# Patient Record
Sex: Male | Born: 1950
Health system: Southern US, Community
[De-identification: ages and names within clinical notes are randomized; demographics above are authoritative.]

## PROBLEM LIST (undated history)

## (undated) DIAGNOSIS — I10 Essential (primary) hypertension: Secondary | ICD-10-CM

## (undated) DIAGNOSIS — E785 Hyperlipidemia, unspecified: Secondary | ICD-10-CM

## (undated) DIAGNOSIS — E119 Type 2 diabetes mellitus without complications: Secondary | ICD-10-CM

## (undated) DIAGNOSIS — C801 Malignant (primary) neoplasm, unspecified: Secondary | ICD-10-CM

## (undated) DIAGNOSIS — I2699 Other pulmonary embolism without acute cor pulmonale: Secondary | ICD-10-CM

## (undated) DIAGNOSIS — Z5189 Encounter for other specified aftercare: Secondary | ICD-10-CM

## (undated) DIAGNOSIS — H409 Unspecified glaucoma: Secondary | ICD-10-CM

## (undated) DIAGNOSIS — I471 Supraventricular tachycardia, unspecified: Secondary | ICD-10-CM

## (undated) DIAGNOSIS — H269 Unspecified cataract: Secondary | ICD-10-CM

## (undated) DIAGNOSIS — I499 Cardiac arrhythmia, unspecified: Secondary | ICD-10-CM

## (undated) DIAGNOSIS — S29011A Strain of muscle and tendon of front wall of thorax, initial encounter: Secondary | ICD-10-CM

## (undated) HISTORY — DX: Unspecified cataract: H26.9

## (undated) HISTORY — PX: LAPAROSCOPY ABDOMEN DIAGNOSTIC: PRO50

## (undated) HISTORY — PX: EYE SURGERY: SHX253

## (undated) HISTORY — PX: COLONOSCOPY: SHX174

## (undated) HISTORY — DX: Type 2 diabetes mellitus without complications: E11.9

## (undated) HISTORY — PX: TENDON GRAFT: SHX2486

## (undated) HISTORY — DX: Essential (primary) hypertension: I10

## (undated) HISTORY — DX: Hyperlipidemia, unspecified: E78.5

---
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
## 2006-10-23 ENCOUNTER — Ambulatory Visit: Payer: Self-pay | Admitting: Gastroenterology

## 2010-12-20 ENCOUNTER — Ambulatory Visit: Payer: Self-pay | Admitting: Family Medicine

## 2011-01-10 ENCOUNTER — Ambulatory Visit: Payer: Self-pay | Admitting: Family Medicine

## 2011-02-10 ENCOUNTER — Ambulatory Visit: Payer: Self-pay | Admitting: Family Medicine

## 2011-10-31 ENCOUNTER — Ambulatory Visit: Payer: Self-pay | Admitting: Family Medicine

## 2013-06-11 DIAGNOSIS — H409 Unspecified glaucoma: Secondary | ICD-10-CM

## 2013-06-11 HISTORY — DX: Unspecified glaucoma: H40.9

## 2014-02-11 ENCOUNTER — Ambulatory Visit: Payer: Self-pay | Admitting: Family Medicine

## 2014-02-17 DIAGNOSIS — E119 Type 2 diabetes mellitus without complications: Secondary | ICD-10-CM

## 2014-02-17 DIAGNOSIS — I10 Essential (primary) hypertension: Secondary | ICD-10-CM | POA: Insufficient documentation

## 2014-02-17 DIAGNOSIS — E1169 Type 2 diabetes mellitus with other specified complication: Secondary | ICD-10-CM | POA: Insufficient documentation

## 2014-02-17 DIAGNOSIS — E785 Hyperlipidemia, unspecified: Secondary | ICD-10-CM | POA: Insufficient documentation

## 2014-02-17 DIAGNOSIS — I152 Hypertension secondary to endocrine disorders: Secondary | ICD-10-CM | POA: Insufficient documentation

## 2014-12-07 DIAGNOSIS — I10 Essential (primary) hypertension: Secondary | ICD-10-CM | POA: Insufficient documentation

## 2014-12-08 ENCOUNTER — Ambulatory Visit (INDEPENDENT_AMBULATORY_CARE_PROVIDER_SITE_OTHER): Payer: Medicare Other | Admitting: Family Medicine

## 2014-12-08 ENCOUNTER — Encounter: Payer: Self-pay | Admitting: Family Medicine

## 2014-12-08 VITALS — BP 135/78 | HR 88 | Temp 98.7°F | Ht 70.0 in | Wt 230.0 lb

## 2014-12-08 DIAGNOSIS — E119 Type 2 diabetes mellitus without complications: Secondary | ICD-10-CM

## 2014-12-08 DIAGNOSIS — E785 Hyperlipidemia, unspecified: Secondary | ICD-10-CM

## 2014-12-08 DIAGNOSIS — I10 Essential (primary) hypertension: Secondary | ICD-10-CM | POA: Diagnosis not present

## 2014-12-08 LAB — BAYER DCA HB A1C WAIVED: HB A1C (BAYER DCA - WAIVED): 6.2 % (ref <7.0)

## 2014-12-08 NOTE — Assessment & Plan Note (Signed)
The current medical regimen is effective;  continue present plan and medications.  

## 2014-12-08 NOTE — Progress Notes (Signed)
   BP 135/78 mmHg  Pulse 88  Temp(Src) 98.7 F (37.1 C)  Ht 5\' 10"  (1.778 m)  Wt 230 lb (104.327 kg)  BMI 33.00 kg/m2  SpO2 98%   Subjective:    Patient ID: Randy Harper, male    DOB: 11-15-1950, 64 y.o.   MRN: [id number]  HPI: Randy Harper is a 64 y.o. male  Chief Complaint  Patient presents with  . Diabetes  BP and lipids all doing well Takes meds every day No low glu spells No side effects Doing well long term   Relevant past medical, surgical, family and social history reviewed and updated as indicated. Interim medical history since our last visit reviewed. Allergies and medications reviewed and updated.  Review of Systems  Constitutional: Negative.   Respiratory: Negative.   Cardiovascular: Negative.     Per HPI unless specifically indicated above     Objective:    BP 135/78 mmHg  Pulse 88  Temp(Src) 98.7 F (37.1 C)  Ht 5\' 10"  (1.778 m)  Wt 230 lb (104.327 kg)  BMI 33.00 kg/m2  SpO2 98%  Wt Readings from Last 3 Encounters:  12/08/14 230 lb (104.327 kg)  08/23/14 231 lb (104.781 kg)    Physical Exam  Constitutional: He is oriented to person, place, and time. He appears well-developed and well-nourished. No distress.  HENT:  Head: Normocephalic and atraumatic.  Right Ear: Hearing normal.  Left Ear: Hearing normal.  Nose: Nose normal.  Eyes: Conjunctivae and lids are normal. Right eye exhibits no discharge. Left eye exhibits no discharge. No scleral icterus.  Cardiovascular: Normal rate, regular rhythm and normal heart sounds.   Pulmonary/Chest: Effort normal and breath sounds normal. No respiratory distress.  Musculoskeletal: Normal range of motion.  Neurological: He is alert and oriented to person, place, and time.  Skin: Skin is intact. No rash noted.  Psychiatric: He has a normal mood and affect. His speech is normal and behavior is normal. Judgment and thought content normal. Cognition and memory are normal.    No results found for this  or any previous visit.    Assessment & Plan:   Problem List Items Addressed This Visit      Cardiovascular and Mediastinum   Hypertension    The current medical regimen is effective;  continue present plan and medications.         Endocrine   Diabetes mellitus without complication - Primary    The current medical regimen is effective;  continue present plan and medications.       Relevant Orders   Bayer DCA Hb A1c Waived     Other   Hyperlipidemia    The current medical regimen is effective;  continue present plan and medications.           Follow up plan: Return in about 3 months (around 03/10/2015) for Physical Exam and a1c.

## 2014-12-28 ENCOUNTER — Other Ambulatory Visit: Payer: Self-pay | Admitting: Family Medicine

## 2014-12-30 ENCOUNTER — Other Ambulatory Visit: Payer: Self-pay | Admitting: Family Medicine

## 2015-02-19 ENCOUNTER — Other Ambulatory Visit: Payer: Self-pay | Admitting: Family Medicine

## 2015-03-15 ENCOUNTER — Encounter: Payer: Self-pay | Admitting: Family Medicine

## 2015-03-15 ENCOUNTER — Ambulatory Visit (INDEPENDENT_AMBULATORY_CARE_PROVIDER_SITE_OTHER): Payer: Medicare Other | Admitting: Family Medicine

## 2015-03-15 VITALS — BP 115/76 | HR 94 | Temp 97.7°F | Ht 68.6 in | Wt 225.0 lb

## 2015-03-15 DIAGNOSIS — Z23 Encounter for immunization: Secondary | ICD-10-CM

## 2015-03-15 DIAGNOSIS — N400 Enlarged prostate without lower urinary tract symptoms: Secondary | ICD-10-CM | POA: Diagnosis not present

## 2015-03-15 DIAGNOSIS — I10 Essential (primary) hypertension: Secondary | ICD-10-CM

## 2015-03-15 DIAGNOSIS — Z0000 Encounter for general adult medical examination without abnormal findings: Secondary | ICD-10-CM

## 2015-03-15 DIAGNOSIS — E785 Hyperlipidemia, unspecified: Secondary | ICD-10-CM | POA: Diagnosis not present

## 2015-03-15 DIAGNOSIS — E119 Type 2 diabetes mellitus without complications: Secondary | ICD-10-CM

## 2015-03-15 LAB — URINALYSIS, ROUTINE W REFLEX MICROSCOPIC
BILIRUBIN UA: NEGATIVE
GLUCOSE, UA: NEGATIVE
KETONES UA: NEGATIVE
Leukocytes, UA: NEGATIVE
Nitrite, UA: NEGATIVE
RBC UA: NEGATIVE
SPEC GRAV UA: 1.02 (ref 1.005–1.030)
UUROB: 2 mg/dL — AB (ref 0.2–1.0)
pH, UA: 7 (ref 5.0–7.5)

## 2015-03-15 LAB — BAYER DCA HB A1C WAIVED: HB A1C (BAYER DCA - WAIVED): 6.1 % (ref <7.0)

## 2015-03-15 MED ORDER — MELOXICAM 15 MG PO TABS: 15.0000 mg | ORAL_TABLET | Freq: Every day | ORAL | Status: DC

## 2015-03-15 MED ORDER — SIMVASTATIN 40 MG PO TABS: 40.0000 mg | ORAL_TABLET | Freq: Every day | ORAL | Status: DC

## 2015-03-15 MED ORDER — BENAZEPRIL HCL 10 MG PO TABS: 10.0000 mg | ORAL_TABLET | Freq: Every day | ORAL | Status: DC

## 2015-03-15 MED ORDER — EZETIMIBE 10 MG PO TABS: 10.0000 mg | ORAL_TABLET | Freq: Every day | ORAL | Status: DC

## 2015-03-15 MED ORDER — METFORMIN HCL 500 MG PO TABS: 500.0000 mg | ORAL_TABLET | Freq: Two times a day (BID) | ORAL | Status: DC

## 2015-03-15 NOTE — Assessment & Plan Note (Signed)
The current medical regimen is effective;  continue present plan and medications.  

## 2015-03-15 NOTE — Progress Notes (Signed)
BP 115/76 mmHg  Pulse 94  Temp(Src) 97.7 F (36.5 C)  Ht 5' 8.6" (1.742 m)  Wt 225 lb (102.059 kg)  BMI 33.63 kg/m2  SpO2 98%   Subjective:    Patient ID: Randy Harper, male    DOB: Aug 16, 1950, 64 y.o.   MRN: [id number]  HPI: Randy Harper is a 64 y.o. male  Chief Complaint  Patient presents with  . Annual Exam  AWV metric done  Patient also follow-up hypertension blood pressure doing well no complaints from benazepril.  Follow-up diabetes no low blood sugar spells no issues with metformin no GI upset When checks blood sugars has been doing well.  Cholesterol doing well no complaints from medications. All in all taking medications without side effects and taking faithfully.   Relevant past medical, surgical, family and social history reviewed and updated as indicated. Interim medical history since our last visit reviewed. Allergies and medications reviewed and updated.  Review of Systems  Constitutional: Negative.   HENT: Negative.   Eyes: Negative.   Respiratory: Negative.   Cardiovascular: Negative.   Gastrointestinal: Negative.   Endocrine: Negative.   Genitourinary: Negative.   Musculoskeletal: Negative.   Skin: Negative.   Allergic/Immunologic: Negative.   Neurological: Negative.   Hematological: Negative.   Psychiatric/Behavioral: Negative.     Per HPI unless specifically indicated above     Objective:    BP 115/76 mmHg  Pulse 94  Temp(Src) 97.7 F (36.5 C)  Ht 5' 8.6" (1.742 m)  Wt 225 lb (102.059 kg)  BMI 33.63 kg/m2  SpO2 98%  Wt Readings from Last 3 Encounters:  03/15/15 225 lb (102.059 kg)  12/08/14 230 lb (104.327 kg)  08/23/14 231 lb (104.781 kg)    Physical Exam  Constitutional: He is oriented to person, place, and time. He appears well-developed and well-nourished.  HENT:  Head: Normocephalic and atraumatic.  Right Ear: External ear normal.  Left Ear: External ear normal.  Eyes: Conjunctivae and EOM are normal. Pupils are  equal, round, and reactive to light.  Neck: Normal range of motion. Neck supple.  Cardiovascular: Normal rate, regular rhythm, normal heart sounds and intact distal pulses.   Pulmonary/Chest: Effort normal and breath sounds normal.  Abdominal: Soft. Bowel sounds are normal. There is no splenomegaly or hepatomegaly.  Genitourinary: Rectum normal, prostate normal and penis normal.  Mild BPH  Musculoskeletal: Normal range of motion.  Patient had a big weekend with grandchildren now left hip bursitis is flared started meloxicam yesterday  Neurological: He is alert and oriented to person, place, and time. He has normal reflexes.  Skin: No rash noted. No erythema.  Psychiatric: He has a normal mood and affect. His behavior is normal. Judgment and thought content normal.    Results for orders placed or performed in visit on 12/08/14  Bayer DCA Hb A1c Waived  Result Value Ref Range   Bayer DCA Hb A1c Waived 6.2 <7.0 %      Assessment & Plan:   Problem List Items Addressed This Visit      Cardiovascular and Mediastinum   Hypertension    The current medical regimen is effective;  continue present plan and medications.       Relevant Medications   ezetimibe (ZETIA) 10 MG tablet   simvastatin (ZOCOR) 40 MG tablet   benazepril (LOTENSIN) 10 MG tablet   Other Relevant Orders   Comprehensive metabolic panel   CBC with Differential/Platelet   TSH   Urinalysis, Routine w reflex  microscopic (not at [facility])     Endocrine   Diabetes mellitus without complication ([place])    The current medical regimen is effective;  continue present plan and medications.       Relevant Medications   simvastatin (ZOCOR) 40 MG tablet   metFORMIN (GLUCOPHAGE) 500 MG tablet   benazepril (LOTENSIN) 10 MG tablet   Other Relevant Orders   Comprehensive metabolic panel   CBC with Differential/Platelet   Bayer DCA Hb A1c Waived   TSH   Urinalysis, Routine w reflex microscopic (not at [facility])     Genitourinary    BPH (benign prostatic hyperplasia)   Relevant Orders   TSH   PSA     Other   Hyperlipidemia    The current medical regimen is effective;  continue present plan and medications.       Relevant Medications   ezetimibe (ZETIA) 10 MG tablet   simvastatin (ZOCOR) 40 MG tablet   benazepril (LOTENSIN) 10 MG tablet   Other Relevant Orders   Comprehensive metabolic panel   Lipid panel   CBC with Differential/Platelet   TSH   Urinalysis, Routine w reflex microscopic (not at [facility])    Other Visit Diagnoses    PE (physical exam), annual    -  Primary    Immunization due        Relevant Orders    Flu Vaccine QUAD 36+ mos PF IM (Fluarix & Fluzone Quad PF) (Completed)        Follow up plan: Return in about 3 months (around 06/15/2015), or if symptoms worsen or fail to improve, for DM and a1c.

## 2015-03-16 ENCOUNTER — Encounter: Payer: Self-pay | Admitting: Family Medicine

## 2015-03-16 LAB — CBC WITH DIFFERENTIAL/PLATELET
BASOS ABS: 0 10*3/uL (ref 0.0–0.2)
BASOS: 0 %
EOS (ABSOLUTE): 0.2 10*3/uL (ref 0.0–0.4)
Eos: 2 %
HEMOGLOBIN: 15 g/dL (ref 12.6–17.7)
Hematocrit: 42 % (ref 37.5–51.0)
IMMATURE GRANS (ABS): 0 10*3/uL (ref 0.0–0.1)
IMMATURE GRANULOCYTES: 0 %
LYMPHS: 17 %
Lymphocytes Absolute: 1.2 10*3/uL (ref 0.7–3.1)
MCH: 31.4 pg (ref 26.6–33.0)
MCHC: 35.7 g/dL (ref 31.5–35.7)
MCV: 88 fL (ref 79–97)
MONOCYTES: 9 %
Monocytes Absolute: 0.7 10*3/uL (ref 0.1–0.9)
NEUTROS ABS: 5.1 10*3/uL (ref 1.4–7.0)
Neutrophils: 72 %
Platelets: 229 10*3/uL (ref 150–379)
RBC: 4.78 x10E6/uL (ref 4.14–5.80)
RDW: 12.8 % (ref 12.3–15.4)
WBC: 7.1 10*3/uL (ref 3.4–10.8)

## 2015-03-16 LAB — COMPREHENSIVE METABOLIC PANEL
A/G RATIO: 1.7 (ref 1.1–2.5)
ALK PHOS: 77 IU/L (ref 39–117)
ALT: 18 IU/L (ref 0–44)
AST: 16 IU/L (ref 0–40)
Albumin: 4.3 g/dL (ref 3.6–4.8)
BUN/Creatinine Ratio: 16 (ref 10–22)
BUN: 14 mg/dL (ref 8–27)
Bilirubin Total: 0.4 mg/dL (ref 0.0–1.2)
CO2: 24 mmol/L (ref 18–29)
Calcium: 9.4 mg/dL (ref 8.6–10.2)
Chloride: 99 mmol/L (ref 97–108)
Creatinine, Ser: 0.88 mg/dL (ref 0.76–1.27)
GFR calc Af Amer: 103 mL/min/{1.73_m2} (ref >59)
GFR calc non Af Amer: 90 mL/min/{1.73_m2} (ref >59)
GLOBULIN, TOTAL: 2.5 g/dL (ref 1.5–4.5)
Glucose: 95 mg/dL (ref 65–99)
POTASSIUM: 4.5 mmol/L (ref 3.5–5.2)
SODIUM: 138 mmol/L (ref 134–144)
Total Protein: 6.8 g/dL (ref 6.0–8.5)

## 2015-03-16 LAB — LIPID PANEL
CHOL/HDL RATIO: 3.2 ratio (ref 0.0–5.0)
CHOLESTEROL TOTAL: 127 mg/dL (ref 100–199)
HDL: 40 mg/dL (ref >39)
LDL Calculated: 64 mg/dL (ref 0–99)
TRIGLYCERIDES: 114 mg/dL (ref 0–149)
VLDL Cholesterol Cal: 23 mg/dL (ref 5–40)

## 2015-03-16 LAB — TSH: TSH: 1.16 u[IU]/mL (ref 0.450–4.500)

## 2015-03-16 LAB — PSA: Prostate Specific Ag, Serum: 0.9 ng/mL (ref 0.0–4.0)

## 2015-05-07 ENCOUNTER — Other Ambulatory Visit: Payer: Self-pay | Admitting: Family Medicine

## 2015-05-21 ENCOUNTER — Other Ambulatory Visit: Payer: Self-pay | Admitting: Family Medicine

## 2015-05-23 NOTE — Telephone Encounter (Signed)
Your patient 

## 2015-06-15 ENCOUNTER — Ambulatory Visit (INDEPENDENT_AMBULATORY_CARE_PROVIDER_SITE_OTHER): Payer: Medicare Other | Admitting: Family Medicine

## 2015-06-15 ENCOUNTER — Encounter: Payer: Self-pay | Admitting: Family Medicine

## 2015-06-15 VITALS — BP 133/83 | HR 87 | Temp 97.9°F | Ht 69.0 in | Wt 232.0 lb

## 2015-06-15 DIAGNOSIS — I10 Essential (primary) hypertension: Secondary | ICD-10-CM

## 2015-06-15 DIAGNOSIS — E119 Type 2 diabetes mellitus without complications: Secondary | ICD-10-CM | POA: Diagnosis not present

## 2015-06-15 DIAGNOSIS — Z113 Encounter for screening for infections with a predominantly sexual mode of transmission: Secondary | ICD-10-CM | POA: Diagnosis not present

## 2015-06-15 DIAGNOSIS — S29011A Strain of muscle and tendon of front wall of thorax, initial encounter: Secondary | ICD-10-CM | POA: Diagnosis not present

## 2015-06-15 LAB — BAYER DCA HB A1C WAIVED: HB A1C (BAYER DCA - WAIVED): 6.3 % (ref <7.0)

## 2015-06-15 NOTE — Progress Notes (Signed)
BP 133/83 mmHg  Pulse 87  Temp(Src) 97.9 F (36.6 C)  Ht 5\' 9"  (1.753 m)  Wt 232 lb (105.235 kg)  BMI 34.24 kg/m2  SpO2 94%   Subjective:    Patient ID: Randy Harper, male    DOB: 02-07-1951, 65 y.o.   MRN: [id number]  HPI: Randy Harper is a 65 y.o. male  Chief Complaint  Patient presents with  . Diabetes  . sensitivity to pressure right side of chest   Patient doing well with medications taken faithfully with no side effects blood pressure doing well, cholesterol doing well, and diabetes with no low blood sugar spells or issues. Patient does care for his infant grandchildren has some right nipple area tenderness been ongoing about 5 weeks no known specific trauma does carry in the right cryptic of his arm. No masses no lumps no rash. Relevant past medical, surgical, family and social history reviewed and updated as indicated. Interim medical history since our last visit reviewed. Allergies and medications reviewed and updated.  Review of Systems  Constitutional: Negative.   Respiratory: Negative.   Cardiovascular: Negative.     Per HPI unless specifically indicated above     Objective:    BP 133/83 mmHg  Pulse 87  Temp(Src) 97.9 F (36.6 C)  Ht 5\' 9"  (1.753 m)  Wt 232 lb (105.235 kg)  BMI 34.24 kg/m2  SpO2 94%  Wt Readings from Last 3 Encounters:  06/15/15 232 lb (105.235 kg)  03/15/15 225 lb (102.059 kg)  12/08/14 230 lb (104.327 kg)    Physical Exam  Constitutional: He is oriented to person, place, and time. He appears well-developed and well-nourished. No distress.  HENT:  Head: Normocephalic and atraumatic.  Right Ear: Hearing normal.  Left Ear: Hearing normal.  Nose: Nose normal.  Eyes: Conjunctivae and lids are normal. Right eye exhibits no discharge. Left eye exhibits no discharge. No scleral icterus.  Cardiovascular: Normal rate, regular rhythm and normal heart sounds.   Pulmonary/Chest: Effort normal and breath sounds normal. No respiratory  distress.  Musculoskeletal: Normal range of motion.  Neurological: He is alert and oriented to person, place, and time.  Skin: Skin is intact. No rash noted.  Right nipple area left nipple area and no abnormalities no masses no lumps no real tenderness not worse with tightening up of pictorial muscle   Psychiatric: He has a normal mood and affect. His speech is normal and behavior is normal. Judgment and thought content normal. Cognition and memory are normal.    Results for orders placed or performed in visit on 03/15/15  Comprehensive metabolic panel  Result Value Ref Range   Glucose 95 65 - 99 mg/dL   BUN 14 8 - 27 mg/dL   Creatinine, Ser 0.88 0.76 - 1.27 mg/dL   GFR calc non Af Amer 90 >59 mL/min/1.73   GFR calc Af Amer 103 >59 mL/min/1.73   BUN/Creatinine Ratio 16 10 - 22   Sodium 138 134 - 144 mmol/L   Potassium 4.5 3.5 - 5.2 mmol/L   Chloride 99 97 - 108 mmol/L   CO2 24 18 - 29 mmol/L   Calcium 9.4 8.6 - 10.2 mg/dL   Total Protein 6.8 6.0 - 8.5 g/dL   Albumin 4.3 3.6 - 4.8 g/dL   Globulin, Total 2.5 1.5 - 4.5 g/dL   Albumin/Globulin Ratio 1.7 1.1 - 2.5   Bilirubin Total 0.4 0.0 - 1.2 mg/dL   Alkaline Phosphatase 77 39 - 117 IU/L  AST 16 0 - 40 IU/L   ALT 18 0 - 44 IU/L  Lipid panel  Result Value Ref Range   Cholesterol, Total 127 100 - 199 mg/dL   Triglycerides 114 0 - 149 mg/dL   HDL 40 >39 mg/dL   VLDL Cholesterol Cal 23 5 - 40 mg/dL   LDL Calculated 64 0 - 99 mg/dL   Chol/HDL Ratio 3.2 0.0 - 5.0 ratio units  CBC with Differential/Platelet  Result Value Ref Range   WBC 7.1 3.4 - 10.8 x10E3/uL   RBC 4.78 4.14 - 5.80 x10E6/uL   Hemoglobin 15.0 12.6 - 17.7 g/dL   Hematocrit 42.0 37.5 - 51.0 %   MCV 88 79 - 97 fL   MCH 31.4 26.6 - 33.0 pg   MCHC 35.7 31.5 - 35.7 g/dL   RDW 12.8 12.3 - 15.4 %   Platelets 229 150 - 379 x10E3/uL   Neutrophils 72 %   Lymphs 17 %   Monocytes 9 %   Eos 2 %   Basos 0 %   Neutrophils Absolute 5.1 1.4 - 7.0 x10E3/uL   Lymphocytes  Absolute 1.2 0.7 - 3.1 x10E3/uL   Monocytes Absolute 0.7 0.1 - 0.9 x10E3/uL   EOS (ABSOLUTE) 0.2 0.0 - 0.4 x10E3/uL   Basophils Absolute 0.0 0.0 - 0.2 x10E3/uL   Immature Granulocytes 0 %   Immature Grans (Abs) 0.0 0.0 - 0.1 x10E3/uL  Bayer DCA Hb A1c Waived  Result Value Ref Range   Bayer DCA Hb A1c Waived 6.1 <7.0 %  TSH  Result Value Ref Range   TSH 1.160 0.450 - 4.500 uIU/mL  Urinalysis, Routine w reflex microscopic (not at [facility])  Result Value Ref Range   Specific Gravity, UA 1.020 1.005 - 1.030   pH, UA 7.0 5.0 - 7.5   Color, UA Yellow Yellow   Appearance Ur Clear Clear   Leukocytes, UA Negative Negative   Protein, UA Trace Negative/Trace   Glucose, UA Negative Negative   Ketones, UA Negative Negative   RBC, UA Negative Negative   Bilirubin, UA Negative Negative   Urobilinogen, Ur 2.0 (H) 0.2 - 1.0 mg/dL   Nitrite, UA Negative Negative  PSA  Result Value Ref Range   Prostate Specific Ag, Serum 0.9 0.0 - 4.0 ng/mL      Assessment & Plan:   Problem List Items Addressed This Visit      Cardiovascular and Mediastinum   Hypertension    The current medical regimen is effective;  continue present plan and medications.         Endocrine   Diabetes mellitus without complication ([place]) - Primary    The current medical regimen is effective;  continue present plan and medications.       Relevant Orders   Bayer DCA Hb A1c Waived     Musculoskeletal and Integument   Muscle strain of chest wall    Discussed care and treatment observing change in symptoms will recheck.       Other Visit Diagnoses    Routine screening for STI (sexually transmitted infection)        Relevant Orders    Hepatitis C Antibody        Follow up plan: Return in about 3 months (around 09/13/2015), or if symptoms worsen or fail to improve, for Hemoglobin A1c, lipids, ALT, AST and BMP.

## 2015-06-15 NOTE — Assessment & Plan Note (Signed)
The current medical regimen is effective;  continue present plan and medications.  

## 2015-06-15 NOTE — Assessment & Plan Note (Signed)
Discussed care and treatment observing change in symptoms will recheck.

## 2015-06-16 ENCOUNTER — Encounter: Payer: Self-pay | Admitting: Family Medicine

## 2015-06-16 LAB — HEPATITIS C ANTIBODY

## 2015-08-17 LAB — HM DIABETES EYE EXAM

## 2015-09-13 ENCOUNTER — Encounter: Payer: Self-pay | Admitting: Family Medicine

## 2015-09-13 ENCOUNTER — Ambulatory Visit (INDEPENDENT_AMBULATORY_CARE_PROVIDER_SITE_OTHER): Payer: Medicare Other | Admitting: Family Medicine

## 2015-09-13 VITALS — BP 121/74 | HR 87 | Temp 98.0°F | Ht 69.3 in | Wt 233.0 lb

## 2015-09-13 DIAGNOSIS — I10 Essential (primary) hypertension: Secondary | ICD-10-CM

## 2015-09-13 DIAGNOSIS — E785 Hyperlipidemia, unspecified: Secondary | ICD-10-CM | POA: Diagnosis not present

## 2015-09-13 DIAGNOSIS — E119 Type 2 diabetes mellitus without complications: Secondary | ICD-10-CM | POA: Diagnosis not present

## 2015-09-13 LAB — LP+ALT+AST PICCOLO, WAIVED
ALT (SGPT) PICCOLO, WAIVED: 25 U/L (ref 10–47)
AST (SGOT) PICCOLO, WAIVED: 31 U/L (ref 11–38)
CHOL/HDL RATIO PICCOLO,WAIVE: 3.5 mg/dL
Cholesterol Piccolo, Waived: 130 mg/dL (ref <200)
HDL Chol Piccolo, Waived: 37 mg/dL — ABNORMAL LOW (ref >59)
LDL Chol Calc Piccolo Waived: 51 mg/dL (ref <100)
Triglycerides Piccolo,Waived: 208 mg/dL — ABNORMAL HIGH (ref <150)
VLDL CHOL CALC PICCOLO,WAIVE: 42 mg/dL — AB (ref <30)

## 2015-09-13 LAB — BAYER DCA HB A1C WAIVED: HB A1C: 6.2 % (ref <7.0)

## 2015-09-13 NOTE — Progress Notes (Signed)
   BP 121/74 mmHg  Pulse 87  Temp(Src) 98 F (36.7 C)  Ht 5' 9.3" (1.76 m)  Wt 233 lb (105.688 kg)  BMI 34.12 kg/m2  SpO2 96%   Subjective:    Patient ID: Randy Harper, male    DOB: Oct 06, 1950, 65 y.o.   MRN: [id number]  HPI: Randy Harper is a 65 y.o. male  Chief Complaint  Patient presents with  . Diabetes  . Hyperlipidemia  . Hypertension  Patient follow-up doing well with diabetes noted low blood sugar spells issues with medications taken faithfully Cholesterol no problems no myalgias Blood pressure good control Getting good reports from eye doctor  Relevant past medical, surgical, family and social history reviewed and updated as indicated. Interim medical history since our last visit reviewed. Allergies and medications reviewed and updated.  Review of Systems  Constitutional: Negative.   Respiratory: Negative.   Cardiovascular: Negative.     Per HPI unless specifically indicated above     Objective:    BP 121/74 mmHg  Pulse 87  Temp(Src) 98 F (36.7 C)  Ht 5' 9.3" (1.76 m)  Wt 233 lb (105.688 kg)  BMI 34.12 kg/m2  SpO2 96%  Wt Readings from Last 3 Encounters:  09/13/15 233 lb (105.688 kg)  06/15/15 232 lb (105.235 kg)  03/15/15 225 lb (102.059 kg)    Physical Exam  Constitutional: He is oriented to person, place, and time. He appears well-developed and well-nourished. No distress.  HENT:  Head: Normocephalic and atraumatic.  Right Ear: Hearing normal.  Left Ear: Hearing normal.  Nose: Nose normal.  Eyes: Conjunctivae and lids are normal. Right eye exhibits no discharge. Left eye exhibits no discharge. No scleral icterus.  Cardiovascular: Normal rate, regular rhythm and normal heart sounds.   Pulmonary/Chest: Effort normal and breath sounds normal. No respiratory distress.  Musculoskeletal: Normal range of motion.  Neurological: He is alert and oriented to person, place, and time.  Skin: Skin is intact. No rash noted.  Psychiatric: He has a  normal mood and affect. His speech is normal and behavior is normal. Judgment and thought content normal. Cognition and memory are normal.    Results for orders placed or performed in visit on 08/22/15  HM DIABETES EYE EXAM  Result Value Ref Range   HM Diabetic Eye Exam No Retinopathy No Retinopathy      Assessment & Plan:   Problem List Items Addressed This Visit      Cardiovascular and Mediastinum   Hypertension - Primary    The current medical regimen is effective;  continue present plan and medications.       Relevant Orders   Basic metabolic panel     Endocrine   Diabetes mellitus without complication ([place])    The current medical regimen is effective;  continue present plan and medications.       Relevant Orders   Bayer DCA Hb A1c Waived     Other   Hyperlipidemia    The current medical regimen is effective;  continue present plan and medications.       Relevant Orders   LP+ALT+AST Piccolo, Waived       Follow up plan: Return in about 3 months (around 12/13/2015) for a1c.

## 2015-09-13 NOTE — Assessment & Plan Note (Signed)
The current medical regimen is effective;  continue present plan and medications.  

## 2015-09-14 LAB — BASIC METABOLIC PANEL
BUN/Creatinine Ratio: 13 (ref 10–24)
BUN: 13 mg/dL (ref 8–27)
CALCIUM: 9.3 mg/dL (ref 8.6–10.2)
CHLORIDE: 99 mmol/L (ref 96–106)
CO2: 25 mmol/L (ref 18–29)
CREATININE: 0.98 mg/dL (ref 0.76–1.27)
GFR, EST AFRICAN AMERICAN: 92 mL/min/{1.73_m2} (ref >59)
GFR, EST NON AFRICAN AMERICAN: 80 mL/min/{1.73_m2} (ref >59)
Glucose: 153 mg/dL — ABNORMAL HIGH (ref 65–99)
Potassium: 4.9 mmol/L (ref 3.5–5.2)
Sodium: 139 mmol/L (ref 134–144)

## 2015-09-19 ENCOUNTER — Encounter: Payer: Self-pay | Admitting: Family Medicine

## 2015-09-28 DIAGNOSIS — M47816 Spondylosis without myelopathy or radiculopathy, lumbar region: Secondary | ICD-10-CM | POA: Insufficient documentation

## 2016-01-04 ENCOUNTER — Ambulatory Visit: Payer: Medicare Other | Admitting: Family Medicine

## 2016-01-19 ENCOUNTER — Encounter: Payer: Self-pay | Admitting: Family Medicine

## 2016-01-19 ENCOUNTER — Other Ambulatory Visit: Payer: Self-pay

## 2016-01-19 ENCOUNTER — Ambulatory Visit (INDEPENDENT_AMBULATORY_CARE_PROVIDER_SITE_OTHER): Payer: Medicare Other | Admitting: Family Medicine

## 2016-01-19 VITALS — BP 128/80 | HR 74 | Temp 98.0°F | Ht 70.5 in | Wt 231.0 lb

## 2016-01-19 DIAGNOSIS — I10 Essential (primary) hypertension: Secondary | ICD-10-CM

## 2016-01-19 DIAGNOSIS — Z23 Encounter for immunization: Secondary | ICD-10-CM | POA: Diagnosis not present

## 2016-01-19 DIAGNOSIS — E785 Hyperlipidemia, unspecified: Secondary | ICD-10-CM | POA: Diagnosis not present

## 2016-01-19 DIAGNOSIS — E119 Type 2 diabetes mellitus without complications: Secondary | ICD-10-CM

## 2016-01-19 LAB — HEMOGLOBIN A1C: HEMOGLOBIN A1C: 6.3

## 2016-01-19 LAB — BAYER DCA HB A1C WAIVED: HB A1C (BAYER DCA - WAIVED): 6.3 % (ref <7.0)

## 2016-01-19 MED ORDER — SIMVASTATIN 40 MG PO TABS: 40.0000 mg | ORAL_TABLET | Freq: Every day | ORAL | 1 refills | Status: DC

## 2016-01-19 NOTE — Progress Notes (Signed)
BP 128/80 (BP Location: Left Arm, Patient Position: Sitting, Cuff Size: Normal)   Pulse 74   Temp 98 F (36.7 C)   Ht 5' 10.5" (1.791 m) Comment: with shoes  Wt 231 lb (104.8 kg) Comment: with shoes  SpO2 97%   BMI 32.68 kg/m    Subjective:    Patient ID: Randy Harper, male    DOB: [birth year], 65 y.o.   MRN: [id number]  HPI: Randy Harper is a 65 y.o. male  Chief Complaint  Patient presents with  . Diabetes  Patient doing well with diabetes no complaints no low blood sugar spells side effects with medications taking faithfully. Blood pressure good control no complaints cholesterol good control no complaints taking medications faithfully without side effects.   Relevant past medical, surgical, family and social history reviewed and updated as indicated. Interim medical history since our last visit reviewed. Allergies and medications reviewed and updated.  Review of Systems  Constitutional: Negative.   Respiratory: Negative.   Cardiovascular: Negative.     Per HPI unless specifically indicated above     Objective:    BP 128/80 (BP Location: Left Arm, Patient Position: Sitting, Cuff Size: Normal)   Pulse 74   Temp 98 F (36.7 C)   Ht 5' 10.5" (1.791 m) Comment: with shoes  Wt 231 lb (104.8 kg) Comment: with shoes  SpO2 97%   BMI 32.68 kg/m   Wt Readings from Last 3 Encounters:  01/19/16 231 lb (104.8 kg)  09/13/15 233 lb (105.7 kg)  06/15/15 232 lb (105.2 kg)    Physical Exam  Constitutional: He is oriented to person, place, and time. He appears well-developed and well-nourished. No distress.  HENT:  Head: Normocephalic and atraumatic.  Right Ear: Hearing normal.  Left Ear: Hearing normal.  Nose: Nose normal.  Eyes: Conjunctivae and lids are normal. Right eye exhibits no discharge. Left eye exhibits no discharge. No scleral icterus.  Cardiovascular: Normal rate, regular rhythm and normal heart sounds.   Pulmonary/Chest: Effort normal and breath sounds  normal. No respiratory distress.  Musculoskeletal: Normal range of motion.  Neurological: He is alert and oriented to person, place, and time.  Skin: Skin is intact. No rash noted.  Psychiatric: He has a normal mood and affect. His speech is normal and behavior is normal. Judgment and thought content normal. Cognition and memory are normal.    Results for orders placed or performed in visit on [id number]  Basic metabolic panel  Result Value Ref Range   Glucose 153 (H) 65 - 99 mg/dL   BUN 13 8 - 27 mg/dL   Creatinine, Ser 0.98 0.76 - 1.27 mg/dL   GFR calc non Af Amer 80 >59 mL/min/1.73   GFR calc Af Amer 92 >59 mL/min/1.73   BUN/Creatinine Ratio 13 10 - 24   Sodium 139 134 - 144 mmol/L   Potassium 4.9 3.5 - 5.2 mmol/L   Chloride 99 96 - 106 mmol/L   CO2 25 18 - 29 mmol/L   Calcium 9.3 8.6 - 10.2 mg/dL  LP+ALT+AST Piccolo, Waived  Result Value Ref Range   ALT (SGPT) Piccolo, Waived 25 10 - 47 U/L   AST (SGOT) Piccolo, Waived 31 11 - 38 U/L   Cholesterol Piccolo, Waived 130 <200 mg/dL   HDL Chol Piccolo, Waived 37 (L) >59 mg/dL   Triglycerides Piccolo,Waived 208 (H) <150 mg/dL   Chol/HDL Ratio Piccolo,Waive 3.5 mg/dL   LDL Chol Calc Piccolo Waived 51 <100 mg/dL   VLDL  Chol Calc Piccolo,Waive 42 (H) <30 mg/dL  Bayer DCA Hb A1c Waived  Result Value Ref Range   Bayer DCA Hb A1c Waived 6.2 <7.0 %      Assessment & Plan:   Problem List Items Addressed This Visit      Cardiovascular and Mediastinum   Hypertension    The current medical regimen is effective;  continue present plan and medications.       Relevant Medications   simvastatin (ZOCOR) 40 MG tablet     Endocrine   Diabetes mellitus without complication ([place]) - Primary    The current medical regimen is effective;  continue present plan and medications.       Relevant Medications   simvastatin (ZOCOR) 40 MG tablet   Other Relevant Orders   Bayer DCA Hb A1c Waived   Pneumococcal polysaccharide vaccine 23-valent  greater than or equal to 2yo subcutaneous/IM (Completed)     Other   Hyperlipidemia    The current medical regimen is effective;  continue present plan and medications.       Relevant Medications   simvastatin (ZOCOR) 40 MG tablet    Other Visit Diagnoses   None.      Follow up plan: Return in about 3 months (around 04/20/2016) for Physical Exam, Hemoglobin A1c.

## 2016-01-19 NOTE — Assessment & Plan Note (Signed)
The current medical regimen is effective;  continue present plan and medications.  

## 2016-01-19 NOTE — Patient Instructions (Signed)
Pneumococcal Polysaccharide Vaccine: What You Need to Know  1. Why get vaccinated?  Vaccination can protect older adults (and some children and younger adults) from pneumococcal disease.  Pneumococcal disease is caused by bacteria that can spread from person to person through close contact. It can cause ear infections, and it can also lead to more serious infections of the:   · Lungs (pneumonia),  · Blood (bacteremia), and  · Covering of the brain and spinal cord (meningitis). Meningitis can cause deafness and brain damage, and it can be fatal.  Anyone can get pneumococcal disease, but children under 2 years of age, people with certain medical conditions, adults over 65 years of age, and cigarette smokers are at the highest risk.  About 18,000 older adults die each year from pneumococcal disease in the [place].  Treatment of pneumococcal infections with penicillin and other drugs used to be more effective. But some strains of the disease have become resistant to these drugs. This makes prevention of the disease, through vaccination, even more important.  2. Pneumococcal polysaccharide vaccine (PPSV23)  Pneumococcal polysaccharide vaccine (PPSV23) protects against 23 types of pneumococcal bacteria. It will not prevent all pneumococcal disease.  PPSV23 is recommended for:  · All adults 65 years of age and older,  · Anyone 2 through 67 years of age with certain long-term health problems,  · Anyone 2 through 67 years of age with a weakened immune system,  · Adults 19 through 67 years of age who smoke cigarettes or have asthma.  Most people need only one dose of PPSV. A second dose is recommended for certain high-risk groups. People 65 and older should get a dose even if they have gotten one or more doses of the vaccine before they turned 65.  Your healthcare provider can give you more information about these recommendations.  Most healthy adults develop protection within 2 to 3 weeks of getting the shot.  3. Some  people should not get this vaccine  · Anyone who has had a life-threatening allergic reaction to PPSV should not get another dose.  · Anyone who has a severe allergy to any component of PPSV should not receive it. Tell your provider if you have any severe allergies.  · Anyone who is moderately or severely ill when the shot is scheduled may be asked to wait until they recover before getting the vaccine. Someone with a mild illness can usually be vaccinated.  · Children less than 2 years of age should not receive this vaccine.  · There is no evidence that PPSV is harmful to either a pregnant woman or to her fetus. However, as a precaution, women who need the vaccine should be vaccinated before becoming pregnant, if possible.  4. Risks of a vaccine reaction  With any medicine, including vaccines, there is a chance of side effects. These are usually mild and go away on their own, but serious reactions are also possible.  About half of people who get PPSV have mild side effects, such as redness or pain where the shot is given, which go away within about two days.  Less than 1 out of 100 people develop a fever, muscle aches, or more severe local reactions.  Problems that could happen after any vaccine:  · People sometimes faint after a medical procedure, including vaccination. Sitting or lying down for about 15 minutes can help prevent fainting, and injuries caused by a fall. Tell your doctor if you feel dizzy, or have vision changes or   ringing in the ears.  · Some people get severe pain in the shoulder and have difficulty moving the arm where a shot was given. This happens very rarely.  · Any medication can cause a severe allergic reaction. Such reactions from a vaccine are very rare, estimated at about 1 in a million doses, and would happen within a few minutes to a few hours after the vaccination.  As with any medicine, there is a very remote chance of a vaccine causing a serious injury or death.  The safety of  vaccines is always being monitored. For more information, visit: www.cdc.gov/vaccinesafety/  5. What if there is a serious reaction?  What should I look for?  Look for anything that concerns you, such as signs of a severe allergic reaction, very high fever, or unusual behavior.   Signs of a severe allergic reaction can include hives, swelling of the face and throat, difficulty breathing, a fast heartbeat, dizziness, and weakness. These would usually start a few minutes to a few hours after the vaccination.  What should I do?  If you think it is a severe allergic reaction or other emergency that can't wait, call 9-1-1 or get to the nearest hospital. Otherwise, call your doctor.  Afterward, the reaction should be reported to the Vaccine Adverse Event Reporting System (VAERS). Your doctor might file this report, or you can do it yourself through the VAERS web site at www.vaers.hhs.gov, or by calling 1-800-822-7967.   VAERS does not give medical advice.  6. How can I learn more?  · Ask your doctor. He or she can give you the vaccine package insert or suggest other sources of information.  · Call your local or state health department.  · Contact the Centers for Disease Control and Prevention (CDC):    Call 1-800-232-4636 (1-800-CDC-INFO) or    Visit CDC's website at www.cdc.gov/vaccines  CDC Pneumococcal Polysaccharide Vaccine VIS (10/02/13)     This information is not intended to replace advice given to you by your health care provider. Make sure you discuss any questions you have with your health care provider.     Document Released: 03/25/2006 Document Revised: 06/18/2014 Document Reviewed: 10/05/2013  Elsevier Interactive Patient Education ©2016 [employer].

## 2016-02-21 LAB — HM DIABETES EYE EXAM

## 2016-02-22 ENCOUNTER — Telehealth: Payer: Self-pay

## 2016-03-21 NOTE — Telephone Encounter (Signed)
Message an error.

## 2016-05-17 ENCOUNTER — Other Ambulatory Visit: Payer: Self-pay | Admitting: Family Medicine

## 2016-05-17 ENCOUNTER — Encounter: Payer: Self-pay | Admitting: Family Medicine

## 2016-05-17 ENCOUNTER — Ambulatory Visit (INDEPENDENT_AMBULATORY_CARE_PROVIDER_SITE_OTHER): Payer: Medicare Other | Admitting: Family Medicine

## 2016-05-17 VITALS — BP 124/79 | HR 93 | Temp 98.1°F | Ht 69.5 in | Wt 233.0 lb

## 2016-05-17 DIAGNOSIS — E785 Hyperlipidemia, unspecified: Secondary | ICD-10-CM

## 2016-05-17 DIAGNOSIS — N400 Enlarged prostate without lower urinary tract symptoms: Secondary | ICD-10-CM

## 2016-05-17 DIAGNOSIS — E782 Mixed hyperlipidemia: Secondary | ICD-10-CM

## 2016-05-17 DIAGNOSIS — I10 Essential (primary) hypertension: Secondary | ICD-10-CM

## 2016-05-17 DIAGNOSIS — E119 Type 2 diabetes mellitus without complications: Secondary | ICD-10-CM

## 2016-05-17 MED ORDER — BENAZEPRIL HCL 10 MG PO TABS: 10.0000 mg | ORAL_TABLET | Freq: Every day | ORAL | 4 refills | Status: DC

## 2016-05-17 MED ORDER — METFORMIN HCL 500 MG PO TABS: 500.0000 mg | ORAL_TABLET | Freq: Two times a day (BID) | ORAL | 4 refills | Status: DC

## 2016-05-17 MED ORDER — SIMVASTATIN 40 MG PO TABS: 40.0000 mg | ORAL_TABLET | Freq: Every day | ORAL | 4 refills | Status: DC

## 2016-05-17 NOTE — Assessment & Plan Note (Signed)
The current medical regimen is effective;  continue present plan and medications.  

## 2016-05-17 NOTE — Progress Notes (Signed)
BP 124/79 (BP Location: Left Arm, Patient Position: Sitting, Cuff Size: Normal)   Pulse 93   Temp 98.1 F (36.7 C)   Ht 5' 9.5" (1.765 m)   Wt 233 lb (105.7 kg)   SpO2 96%   BMI 33.91 kg/m    Subjective:    Patient ID: Randy Harper, male    DOB: [birth year], 65 y.o.   MRN: [id number]  HPI: Randy Harper is a 65 y.o. male  Chief Complaint  Patient presents with  . Annual Exam   Patient also follow-up diabetes doing well with no complaints from metformin taking faithfully without problems noted low blood sugar spells. Also blood pressure cholesterol doing well with no complaints from medications taken faithfully.  Relevant past medical, surgical, family and social history reviewed and updated as indicated. Interim medical history since our last visit reviewed. Allergies and medications reviewed and updated.  Review of Systems  Constitutional: Negative.   HENT: Negative.   Eyes: Negative.   Respiratory: Negative.   Cardiovascular: Negative.   Gastrointestinal: Negative.   Endocrine: Negative.   Genitourinary: Negative.   Musculoskeletal: Negative.   Skin: Negative.   Allergic/Immunologic: Negative.   Neurological: Negative.   Hematological: Negative.   Psychiatric/Behavioral: Negative.     Per HPI unless specifically indicated above     Objective:    BP 124/79 (BP Location: Left Arm, Patient Position: Sitting, Cuff Size: Normal)   Pulse 93   Temp 98.1 F (36.7 C)   Ht 5' 9.5" (1.765 m)   Wt 233 lb (105.7 kg)   SpO2 96%   BMI 33.91 kg/m   Wt Readings from Last 3 Encounters:  05/17/16 233 lb (105.7 kg)  01/19/16 231 lb (104.8 kg)  09/13/15 233 lb (105.7 kg)    Physical Exam  Constitutional: He is oriented to person, place, and time. He appears well-developed and well-nourished.  HENT:  Head: Normocephalic and atraumatic.  Right Ear: External ear normal.  Left Ear: External ear normal.  Eyes: Conjunctivae and EOM are normal. Pupils are equal, round,  and reactive to light.  Neck: Normal range of motion. Neck supple.  Cardiovascular: Normal rate, regular rhythm, normal heart sounds and intact distal pulses.   Pulmonary/Chest: Effort normal and breath sounds normal.  Abdominal: Soft. Bowel sounds are normal. There is no splenomegaly or hepatomegaly.  Genitourinary: Rectum normal, prostate normal and penis normal.  Musculoskeletal: Normal range of motion.  Neurological: He is alert and oriented to person, place, and time. He has normal reflexes.  Skin: No rash noted. No erythema.  Psychiatric: He has a normal mood and affect. His behavior is normal. Judgment and thought content normal.    Results for orders placed or performed in visit on 02/22/16  HM DIABETES EYE EXAM  Result Value Ref Range   HM Diabetic Eye Exam No Retinopathy No Retinopathy      Assessment & Plan:   Problem List Items Addressed This Visit      Cardiovascular and Mediastinum   Hypertension    The current medical regimen is effective;  continue present plan and medications.       Relevant Medications   simvastatin (ZOCOR) 40 MG tablet   benazepril (LOTENSIN) 10 MG tablet   Other Relevant Orders   Comprehensive metabolic panel   CBC with Differential/Platelet   Urinalysis, Routine w reflex microscopic   TSH     Endocrine   Diabetes mellitus without complication ([place]) - Primary    The current medical  regimen is effective;  continue present plan and medications.       Relevant Medications   simvastatin (ZOCOR) 40 MG tablet   metFORMIN (GLUCOPHAGE) 500 MG tablet   benazepril (LOTENSIN) 10 MG tablet   Other Relevant Orders   Urinalysis, Routine w reflex microscopic   Bayer DCA Hb A1c Waived     Genitourinary   BPH (benign prostatic hyperplasia)   Relevant Orders   PSA     Other   Hyperlipidemia    The current medical regimen is effective;  continue present plan and medications.       Relevant Medications   simvastatin (ZOCOR) 40 MG tablet     benazepril (LOTENSIN) 10 MG tablet   Other Relevant Orders   Lipid Profile       Follow up plan: Return for Hemoglobin A1c.

## 2016-05-18 LAB — COMPREHENSIVE METABOLIC PANEL
A/G RATIO: 1.9 (ref 1.2–2.2)
ALT: 21 IU/L (ref 0–44)
AST: 20 IU/L (ref 0–40)
Albumin: 4.3 g/dL (ref 3.6–4.8)
Alkaline Phosphatase: 72 IU/L (ref 39–117)
BUN / CREAT RATIO: 17 (ref 10–24)
BUN: 18 mg/dL (ref 8–27)
Bilirubin Total: 0.3 mg/dL (ref 0.0–1.2)
CALCIUM: 9.1 mg/dL (ref 8.6–10.2)
CO2: 27 mmol/L (ref 18–29)
Chloride: 99 mmol/L (ref 96–106)
Creatinine, Ser: 1.03 mg/dL (ref 0.76–1.27)
GFR, EST AFRICAN AMERICAN: 86 mL/min/{1.73_m2} (ref >59)
GFR, EST NON AFRICAN AMERICAN: 75 mL/min/{1.73_m2} (ref >59)
GLOBULIN, TOTAL: 2.3 g/dL (ref 1.5–4.5)
Glucose: 125 mg/dL — ABNORMAL HIGH (ref 65–99)
POTASSIUM: 4.4 mmol/L (ref 3.5–5.2)
SODIUM: 140 mmol/L (ref 134–144)
TOTAL PROTEIN: 6.6 g/dL (ref 6.0–8.5)

## 2016-05-18 LAB — URINALYSIS, ROUTINE W REFLEX MICROSCOPIC
BILIRUBIN UA: NEGATIVE
GLUCOSE, UA: NEGATIVE
KETONES UA: NEGATIVE
LEUKOCYTES UA: NEGATIVE
NITRITE UA: NEGATIVE
Protein, UA: NEGATIVE
SPEC GRAV UA: 1.015 (ref 1.005–1.030)
Urobilinogen, Ur: 4 mg/dL — ABNORMAL HIGH (ref 0.2–1.0)
pH, UA: 7 (ref 5.0–7.5)

## 2016-05-18 LAB — CBC WITH DIFFERENTIAL/PLATELET
BASOS: 1 %
Basophils Absolute: 0 10*3/uL (ref 0.0–0.2)
EOS (ABSOLUTE): 0.2 10*3/uL (ref 0.0–0.4)
Eos: 3 %
Hematocrit: 41.6 % (ref 37.5–51.0)
Hemoglobin: 13.9 g/dL (ref 13.0–17.7)
Immature Grans (Abs): 0 10*3/uL (ref 0.0–0.1)
Immature Granulocytes: 0 %
Lymphocytes Absolute: 1.3 10*3/uL (ref 0.7–3.1)
Lymphs: 23 %
MCH: 29.8 pg (ref 26.6–33.0)
MCHC: 33.4 g/dL (ref 31.5–35.7)
MCV: 89 fL (ref 79–97)
MONOS ABS: 0.5 10*3/uL (ref 0.1–0.9)
Monocytes: 9 %
NEUTROS ABS: 3.6 10*3/uL (ref 1.4–7.0)
Neutrophils: 64 %
PLATELETS: 214 10*3/uL (ref 150–379)
RBC: 4.66 x10E6/uL (ref 4.14–5.80)
RDW: 13.1 % (ref 12.3–15.4)
WBC: 5.7 10*3/uL (ref 3.4–10.8)

## 2016-05-18 LAB — PSA: PROSTATE SPECIFIC AG, SERUM: 1 ng/mL (ref 0.0–4.0)

## 2016-05-18 LAB — LIPID PANEL
CHOL/HDL RATIO: 3.2 ratio (ref 0.0–5.0)
Cholesterol, Total: 121 mg/dL (ref 100–199)
HDL: 38 mg/dL — ABNORMAL LOW (ref >39)
LDL Calculated: 62 mg/dL (ref 0–99)
Triglycerides: 103 mg/dL (ref 0–149)
VLDL CHOLESTEROL CAL: 21 mg/dL (ref 5–40)

## 2016-05-18 LAB — MICROSCOPIC EXAMINATION
Epithelial Cells (non renal): NONE SEEN /hpf (ref 0–10)
RBC, UA: NONE SEEN /hpf (ref 0–?)

## 2016-05-18 LAB — BAYER DCA HB A1C WAIVED: HB A1C (BAYER DCA - WAIVED): 6.2 % (ref <7.0)

## 2016-05-18 LAB — TSH: TSH: 1.14 u[IU]/mL (ref 0.450–4.500)

## 2016-05-21 ENCOUNTER — Encounter: Payer: Self-pay | Admitting: Family Medicine

## 2016-07-17 ENCOUNTER — Other Ambulatory Visit: Payer: Self-pay | Admitting: Family Medicine

## 2016-09-03 DIAGNOSIS — H401131 Primary open-angle glaucoma, bilateral, mild stage: Secondary | ICD-10-CM | POA: Diagnosis not present

## 2016-09-03 DIAGNOSIS — H359 Unspecified retinal disorder: Secondary | ICD-10-CM | POA: Diagnosis not present

## 2016-09-03 DIAGNOSIS — H35712 Central serous chorioretinopathy, left eye: Secondary | ICD-10-CM | POA: Diagnosis not present

## 2016-09-03 DIAGNOSIS — E119 Type 2 diabetes mellitus without complications: Secondary | ICD-10-CM | POA: Diagnosis not present

## 2016-09-06 ENCOUNTER — Ambulatory Visit: Payer: Medicare Other | Admitting: Family Medicine

## 2016-09-10 ENCOUNTER — Ambulatory Visit (INDEPENDENT_AMBULATORY_CARE_PROVIDER_SITE_OTHER): Payer: Medicare Other | Admitting: Family Medicine

## 2016-09-10 ENCOUNTER — Encounter: Payer: Self-pay | Admitting: Family Medicine

## 2016-09-10 VITALS — BP 116/76 | HR 78 | Temp 97.9°F | Ht 68.0 in | Wt 233.6 lb

## 2016-09-10 DIAGNOSIS — E119 Type 2 diabetes mellitus without complications: Secondary | ICD-10-CM | POA: Diagnosis not present

## 2016-09-10 DIAGNOSIS — N400 Enlarged prostate without lower urinary tract symptoms: Secondary | ICD-10-CM

## 2016-09-10 DIAGNOSIS — I10 Essential (primary) hypertension: Secondary | ICD-10-CM | POA: Diagnosis not present

## 2016-09-10 DIAGNOSIS — E782 Mixed hyperlipidemia: Secondary | ICD-10-CM

## 2016-09-10 LAB — HEMOGLOBIN A1C
ESTIMATED AVERAGE GLUCOSE: 131 mg/dL
Hgb A1c MFr Bld: 6.2 % — ABNORMAL HIGH (ref 4.8–5.6)

## 2016-09-10 NOTE — Assessment & Plan Note (Signed)
The current medical regimen is effective;  continue present plan and medications.  

## 2016-09-10 NOTE — Progress Notes (Signed)
BP 116/76 (BP Location: Right Arm, Patient Position: Sitting, Cuff Size: Large)   Pulse 78   Temp 97.9 F (36.6 C)   Ht 5\' 8"  (1.727 m)   Wt 233 lb 9.6 oz (106 kg)   SpO2 96%   BMI 35.52 kg/m    Subjective:    Patient ID: Randy Harper, male    DOB: 06-12-1950, 66 y.o.   MRN: [id number]  HPI: Randy Harper is a 66 y.o. male  Chief Complaint  Patient presents with  . Follow-up  . Diabetes  Follow-up diabetes doing well no complaints taking medications faithfully with no side effects noted low blood sugar spells Blood pressure doing well good control no issues. Takes simvastatin and Zetia without problems no complaints or issues. BPH stable.  Relevant past medical, surgical, family and social history reviewed and updated as indicated. Interim medical history since our last visit reviewed. Allergies and medications reviewed and updated.  Review of Systems  Constitutional: Negative.   Respiratory: Negative.   Cardiovascular: Negative.     Per HPI unless specifically indicated above     Objective:    BP 116/76 (BP Location: Right Arm, Patient Position: Sitting, Cuff Size: Large)   Pulse 78   Temp 97.9 F (36.6 C)   Ht 5\' 8"  (1.727 m)   Wt 233 lb 9.6 oz (106 kg)   SpO2 96%   BMI 35.52 kg/m   Wt Readings from Last 3 Encounters:  09/10/16 233 lb 9.6 oz (106 kg)  05/17/16 233 lb (105.7 kg)  01/19/16 231 lb (104.8 kg)    Physical Exam  Constitutional: He is oriented to person, place, and time. He appears well-developed and well-nourished.  HENT:  Head: Normocephalic and atraumatic.  Eyes: Conjunctivae and EOM are normal.  Neck: Normal range of motion.  Cardiovascular: Normal rate, regular rhythm and normal heart sounds.   Pulmonary/Chest: Effort normal and breath sounds normal.  Musculoskeletal: Normal range of motion.  Neurological: He is alert and oriented to person, place, and time.  Skin: No erythema.  Psychiatric: He has a normal mood and affect. His  behavior is normal. Judgment and thought content normal.    Results for orders placed or performed in visit on 05/17/16  Microscopic Examination  Result Value Ref Range   WBC, UA 0-5 0 - 5 /hpf   RBC, UA None seen 0 - 2 /hpf   Epithelial Cells (non renal) None seen 0 - 10 /hpf   Mucus, UA Present (A) Not Estab.   Bacteria, UA Few (A) None seen/Few  Comprehensive metabolic panel  Result Value Ref Range   Glucose 125 (H) 65 - 99 mg/dL   BUN 18 8 - 27 mg/dL   Creatinine, Ser 1.03 0.76 - 1.27 mg/dL   GFR calc non Af Amer 75 >59 mL/min/1.73   GFR calc Af Amer 86 >59 mL/min/1.73   BUN/Creatinine Ratio 17 10 - 24   Sodium 140 134 - 144 mmol/L   Potassium 4.4 3.5 - 5.2 mmol/L   Chloride 99 96 - 106 mmol/L   CO2 27 18 - 29 mmol/L   Calcium 9.1 8.6 - 10.2 mg/dL   Total Protein 6.6 6.0 - 8.5 g/dL   Albumin 4.3 3.6 - 4.8 g/dL   Globulin, Total 2.3 1.5 - 4.5 g/dL   Albumin/Globulin Ratio 1.9 1.2 - 2.2   Bilirubin Total 0.3 0.0 - 1.2 mg/dL   Alkaline Phosphatase 72 39 - 117 IU/L   AST 20 0 -  40 IU/L   ALT 21 0 - 44 IU/L  CBC with Differential/Platelet  Result Value Ref Range   WBC 5.7 3.4 - 10.8 x10E3/uL   RBC 4.66 4.14 - 5.80 x10E6/uL   Hemoglobin 13.9 13.0 - 17.7 g/dL   Hematocrit 41.6 37.5 - 51.0 %   MCV 89 79 - 97 fL   MCH 29.8 26.6 - 33.0 pg   MCHC 33.4 31.5 - 35.7 g/dL   RDW 13.1 12.3 - 15.4 %   Platelets 214 150 - 379 x10E3/uL   Neutrophils 64 Not Estab. %   Lymphs 23 Not Estab. %   Monocytes 9 Not Estab. %   Eos 3 Not Estab. %   Basos 1 Not Estab. %   Neutrophils Absolute 3.6 1.4 - 7.0 x10E3/uL   Lymphocytes Absolute 1.3 0.7 - 3.1 x10E3/uL   Monocytes Absolute 0.5 0.1 - 0.9 x10E3/uL   EOS (ABSOLUTE) 0.2 0.0 - 0.4 x10E3/uL   Basophils Absolute 0.0 0.0 - 0.2 x10E3/uL   Immature Granulocytes 0 Not Estab. %   Immature Grans (Abs) 0.0 0.0 - 0.1 x10E3/uL  Lipid Profile  Result Value Ref Range   Cholesterol, Total 121 100 - 199 mg/dL   Triglycerides 103 0 - 149 mg/dL    HDL 38 (L) >39 mg/dL   VLDL Cholesterol Cal 21 5 - 40 mg/dL   LDL Calculated 62 0 - 99 mg/dL   Chol/HDL Ratio 3.2 0.0 - 5.0 ratio units  Urinalysis, Routine w reflex microscopic  Result Value Ref Range   Specific Gravity, UA 1.015 1.005 - 1.030   pH, UA 7.0 5.0 - 7.5   Color, UA Yellow Yellow   Appearance Ur Clear Clear   Leukocytes, UA Negative Negative   Protein, UA Negative Negative/Trace   Glucose, UA Negative Negative   Ketones, UA Negative Negative   RBC, UA Trace (A) Negative   Bilirubin, UA Negative Negative   Urobilinogen, Ur 4.0 (H) 0.2 - 1.0 mg/dL   Nitrite, UA Negative Negative   Microscopic Examination See below:   PSA  Result Value Ref Range   Prostate Specific Ag, Serum 1.0 0.0 - 4.0 ng/mL  TSH  Result Value Ref Range   TSH 1.140 0.450 - 4.500 uIU/mL  Bayer DCA Hb A1c Waived  Result Value Ref Range   Bayer DCA Hb A1c Waived 6.2 <7.0 %      Assessment & Plan:   Problem List Items Addressed This Visit      Cardiovascular and Mediastinum   Hypertension    The current medical regimen is effective;  continue present plan and medications.       Relevant Orders   Hemoglobin A1c     Endocrine   Diabetes mellitus without complication ([place]) - Primary    The current medical regimen is effective;  continue present plan and medications.       Relevant Orders   Hemoglobin A1c     Genitourinary   BPH (benign prostatic hyperplasia)    The current medical regimen is effective;  continue present plan and medications.         Other   Hyperlipidemia    The current medical regimen is effective;  continue present plan and medications.       Relevant Orders   Hemoglobin A1c       Follow up plan: Return in about 3 months (around 12/10/2016) for BMP,  Lipids, ALT, AST, Hemoglobin A1c.

## 2016-10-19 ENCOUNTER — Other Ambulatory Visit: Payer: Self-pay

## 2016-10-19 DIAGNOSIS — I10 Essential (primary) hypertension: Secondary | ICD-10-CM

## 2016-10-19 DIAGNOSIS — E119 Type 2 diabetes mellitus without complications: Secondary | ICD-10-CM

## 2016-10-19 DIAGNOSIS — E785 Hyperlipidemia, unspecified: Secondary | ICD-10-CM

## 2016-10-19 MED ORDER — EZETIMIBE 10 MG PO TABS: 10.0000 mg | ORAL_TABLET | Freq: Every day | ORAL | 0 refills | Status: DC

## 2016-10-19 MED ORDER — BENAZEPRIL HCL 10 MG PO TABS: 10.0000 mg | ORAL_TABLET | Freq: Every day | ORAL | 0 refills | Status: DC

## 2016-10-19 NOTE — Telephone Encounter (Signed)
Pt requesting new RXs for Mail Order Pharmacy due ot insurance changes.   Last OV: 09/10/16 Next OV: 12/10/16  BMP Latest Ref Rng & Units 05/17/2016 09/13/2015 03/15/2015  Glucose 65 - 99 mg/dL 125(H) 153(H) 95  BUN 8 - 27 mg/dL 18 13 14   Creatinine 0.76 - 1.27 mg/dL 1.03 0.98 0.88  BUN/Creat Ratio 10 - 24 17 13 16   Sodium 134 - 144 mmol/L 140 139 138  Potassium 3.5 - 5.2 mmol/L 4.4 4.9 4.5  Chloride 96 - 106 mmol/L 99 99 99  CO2 18 - 29 mmol/L 27 25 24   Calcium 8.6 - 10.2 mg/dL 9.1 9.3 9.4

## 2016-10-19 NOTE — Telephone Encounter (Signed)
Routing to provider. Appt on 12/10/16.

## 2016-10-22 MED ORDER — METFORMIN HCL 500 MG PO TABS: 500.0000 mg | ORAL_TABLET | Freq: Two times a day (BID) | ORAL | 4 refills | Status: DC

## 2016-10-23 ENCOUNTER — Other Ambulatory Visit: Payer: Self-pay | Admitting: Family Medicine

## 2016-10-23 DIAGNOSIS — E785 Hyperlipidemia, unspecified: Secondary | ICD-10-CM

## 2016-10-23 DIAGNOSIS — I10 Essential (primary) hypertension: Secondary | ICD-10-CM

## 2016-10-23 DIAGNOSIS — E782 Mixed hyperlipidemia: Secondary | ICD-10-CM

## 2016-10-23 NOTE — Telephone Encounter (Signed)
Patient needs scripts sent to [facility] for the following  Simvastatin 40mg  once daily 90 day supply Benazapril 10 mg once daily 90 day supply  Ezetimibe (zetia) 10mg  once daily 90 day supply  Thanks  [phone]

## 2016-10-24 MED ORDER — SIMVASTATIN 40 MG PO TABS: 40.0000 mg | ORAL_TABLET | Freq: Every day | ORAL | 4 refills | Status: DC

## 2016-10-24 MED ORDER — EZETIMIBE 10 MG PO TABS: 10.0000 mg | ORAL_TABLET | Freq: Every day | ORAL | 0 refills | Status: DC

## 2016-10-24 MED ORDER — BENAZEPRIL HCL 10 MG PO TABS: 10.0000 mg | ORAL_TABLET | Freq: Every day | ORAL | 0 refills | Status: DC

## 2016-10-24 NOTE — Telephone Encounter (Signed)
Last OV: 09/10/16 Next OV: 12/10/16  BMP Latest Ref Rng & Units 05/17/2016 09/13/2015 03/15/2015  Glucose 65 - 99 mg/dL 125(H) 153(H) 95  BUN 8 - 27 mg/dL 18 13 14   Creatinine 0.76 - 1.27 mg/dL 1.03 0.98 0.88  BUN/Creat Ratio 10 - 24 17 13 16   Sodium 134 - 144 mmol/L 140 139 138  Potassium 3.5 - 5.2 mmol/L 4.4 4.9 4.5  Chloride 96 - 106 mmol/L 99 99 99  CO2 18 - 29 mmol/L 27 25 24   Calcium 8.6 - 10.2 mg/dL 9.1 9.3 9.4    Lab Results  Component Value Date   CHOL 121 05/17/2016   HDL 38 (L) 05/17/2016   LDLCALC 62 05/17/2016   TRIG 103 05/17/2016   CHOLHDL 3.2 05/17/2016   Lab Results  Component Value Date   CREATININE 1.03 05/17/2016   BUN 18 05/17/2016   NA 140 05/17/2016   K 4.4 05/17/2016   CL 99 05/17/2016   CO2 27 05/17/2016

## 2016-11-05 ENCOUNTER — Encounter: Payer: Self-pay | Admitting: Family Medicine

## 2016-11-09 ENCOUNTER — Telehealth: Payer: Self-pay | Admitting: Family Medicine

## 2016-11-09 NOTE — Telephone Encounter (Signed)
Called pt to schedule Annual Wellness Visit with Nurse Health Advisor for July move from Dr. Jeananne Rama to Tiffany's schedule and have pt come back in to see Dr. Jeananne Rama:  - knb

## 2016-12-09 HISTORY — PX: OTHER SURGICAL HISTORY: SHX169

## 2016-12-10 ENCOUNTER — Ambulatory Visit: Payer: Medicare Other | Admitting: Family Medicine

## 2016-12-10 ENCOUNTER — Ambulatory Visit: Payer: Self-pay | Admitting: Family Medicine

## 2016-12-13 ENCOUNTER — Ambulatory Visit: Payer: Self-pay

## 2016-12-19 ENCOUNTER — Ambulatory Visit (INDEPENDENT_AMBULATORY_CARE_PROVIDER_SITE_OTHER): Payer: Medicare Other

## 2016-12-19 VITALS — BP 118/72 | HR 80 | Temp 98.4°F | Resp 16 | Ht 69.0 in | Wt 214.5 lb

## 2016-12-19 DIAGNOSIS — Z0000 Encounter for general adult medical examination without abnormal findings: Secondary | ICD-10-CM | POA: Diagnosis not present

## 2016-12-19 NOTE — Progress Notes (Signed)
Subjective:   Randy Harper is a 66 y.o. male who presents for Medicare Annual/Subsequent preventive examination.  Review of Systems:   Cardiac Risk Factors include: diabetes mellitus;hypertension;dyslipidemia;male gender;advanced age (>47men, >51 women);obesity (BMI >30kg/m2)     Objective:    Vitals: BP 118/72 (BP Location: Left Arm, Patient Position: Sitting)   Pulse 80   Temp 98.4 F (36.9 C)   Resp 16   Ht 5\' 9"  (1.753 m)   Wt 214 lb 8 oz (97.3 kg)   BMI 31.68 kg/m   Body mass index is 31.68 kg/m.  Tobacco History  Smoking Status  . Never Smoker  Smokeless Tobacco  . Never Used     Counseling given: Not Answered   Past Medical History:  Diagnosis Date  . Diabetes mellitus without complication ([place])   . Hyperlipidemia   . Hypertension    Past Surgical History:  Procedure Laterality Date  . TENDON GRAFT Right    right thumb tendon graft at age 51    Family History  Problem Relation Age of Onset  . Heart disease Father   . Diabetes Maternal Uncle   . Diabetes Maternal Uncle   . Diabetes Maternal Uncle   . Diabetes Maternal Uncle    History  Sexual Activity  . Sexual activity: Not on file    Outpatient Encounter Prescriptions as of 12/19/2016  Medication Sig  . aspirin EC 81 MG tablet Take 81 mg by mouth daily.  . benazepril (LOTENSIN) 10 MG tablet Take 1 tablet (10 mg total) by mouth daily.  . brimonidine (ALPHAGAN) 0.2 % ophthalmic solution   . ezetimibe (ZETIA) 10 MG tablet Take 1 tablet (10 mg total) by mouth daily.  . metFORMIN (GLUCOPHAGE) 500 MG tablet Take 1 tablet (500 mg total) by mouth 2 (two) times daily.  . simvastatin (ZOCOR) 40 MG tablet Take 1 tablet (40 mg total) by mouth daily.   No facility-administered encounter medications on file as of 12/19/2016.     Activities of Daily Living In your present state of health, do you have any difficulty performing the following activities: 12/19/2016 05/17/2016  Hearing? N N  Vision? N N    Difficulty concentrating or making decisions? N N  Walking or climbing stairs? N N  Dressing or bathing? N N  Doing errands, shopping? N N  Preparing Food and eating ? N -  Using the Toilet? N -  In the past six months, have you accidently leaked urine? N -  Do you have problems with loss of bowel control? N -  Managing your Medications? N -  Managing your Finances? N -  Housekeeping or managing your Housekeeping? N -  Some recent data might be hidden    Patient Care Team: [name], MD as PCP - General (Family Medicine) Anell Barr, [place] (Optometry) Earnestine Leys, MD (Specialist) Barbette Merino, NP as Nurse Practitioner (Nurse Practitioner) Sherlynn Stalls, MD as Consulting Physician (Ophthalmology)   Assessment:     Exercise Activities and Dietary recommendations Current Exercise Habits: Home exercise routine, Type of exercise: walking, Time (Minutes): 25, Frequency (Times/Week): 5, Weekly Exercise (Minutes/Week): 125, Intensity: Mild, Exercise limited by: None identified  Goals    . Increase water intake          Recommend drinking at least 4-5 glasses of water a day       Fall Risk Fall Risk  12/19/2016 05/17/2016 03/15/2015  Falls in the past year? No Yes Yes  Number  falls in past yr: - 1 1  Injury with Fall? - Yes No   Depression Screen PHQ 2/9 Scores 12/19/2016 05/17/2016 03/15/2015  PHQ - 2 Score 0 0 0    Cognitive Function        Immunization History  Administered Date(s) Administered  . Influenza,inj,Quad PF,36+ Mos 03/15/2015  . Influenza-Unspecified 04/11/2016  . Pneumococcal Conjugate-13 02/03/2014  . Pneumococcal Polysaccharide-23 01/19/2016  . Pneumococcal-Unspecified 11/29/2010  . Td 04/20/2008  . Zoster 05/30/2009   Screening Tests Health Maintenance  Topic Date Due  . FOOT EXAM  06/14/2016  . COLONOSCOPY  10/09/2016  . INFLUENZA VACCINE  01/09/2017  . OPHTHALMOLOGY EXAM  02/20/2017  . HEMOGLOBIN A1C  03/12/2017  .  TETANUS/TDAP  04/20/2018  . Hepatitis C Screening  Completed  . PNA vac Low Risk Adult  Completed      Plan:  I have personally reviewed and addressed the Medicare Annual Wellness questionnaire and have noted the following in the patient's chart:  A. Medical and social history B. Use of alcohol, tobacco or illicit drugs  C. Current medications and supplements D. Functional ability and status E.  Nutritional status F.  Physical activity G. Advance directives H. List of other physicians I.  Hospitalizations, surgeries, and ER visits in previous 12 months J.  [place] such as hearing and vision if needed, cognitive and depression L. Referrals and appointments - none  In addition, I have reviewed and discussed with patient certain preventive protocols, quality metrics, and best practice recommendations. A written personalized care plan for preventive services as well as general preventive health recommendations were provided to patient.   Signed,  Tyler Aas, LPN Nurse Health Advisor   MD Recommendations: due for diabetic foot exam at next visit.   I have reviewed the nurse health advisor's Medicare wellness visit note and agree with the findings and recommendations listed above.  Golden Pop MD

## 2016-12-19 NOTE — Patient Instructions (Addendum)
[name] , Thank you for taking time to come for your Medicare Wellness Visit. I appreciate your ongoing commitment to your health goals. Please review the following plan we discussed and let me know if I can assist you in the future.   Screening recommendations/referrals: Colonoscopy: completed 05/24/2009 Recommended yearly ophthalmology/optometry visit for glaucoma screening and checkup Recommended yearly dental visit for hygiene and checkup  Vaccinations: Influenza vaccine: up to date Pneumococcal vaccine: up to date Tdap vaccine: up to date Shingles vaccine: up to date  Advanced directives: Please bring a copy of your health care power of attorney and living will to the office at your convenience.  Conditions/risks identified: Recommend drinking at least 4-5 glasses of water a day   Next appointment: 3 Month follow up due now,  Follow up in 5 months for your annual physical with Dr.Crissman. Follow up in one year for your annual wellness exam  Preventive Care 68 Years and Older, Male Preventive care refers to lifestyle choices and visits with your health care provider that can promote health and wellness. What does preventive care include?  A yearly physical exam. This is also called an annual well check.  Dental exams once or twice a year.  Routine eye exams. Ask your health care provider how often you should have your eyes checked.  Personal lifestyle choices, including:  Daily care of your teeth and gums.  Regular physical activity.  Eating a healthy diet.  Avoiding tobacco and drug use.  Limiting alcohol use.  Practicing safe sex.  Taking low doses of aspirin every day.  Taking vitamin and mineral supplements as recommended by your health care provider. What happens during an annual well check? The services and screenings done by your health care provider during your annual well check will depend on your age, overall health, lifestyle risk factors, and family  history of disease. Counseling  Your health care provider may ask you questions about your:  Alcohol use.  Tobacco use.  Drug use.  Emotional well-being.  Home and relationship well-being.  Sexual activity.  Eating habits.  History of falls.  Memory and ability to understand (cognition).  Work and work Statistician. Screening  You may have the following tests or measurements:  Height, weight, and BMI.  Blood pressure.  Lipid and cholesterol levels. These may be checked every 5 years, or more frequently if you are over 14 years [place].  Skin check.  Lung cancer screening. You may have this screening every year starting at age 56 if you have a 30-pack-year history of smoking and currently smoke or have quit within the past 15 years.  Fecal occult blood test (FOBT) of the stool. You may have this test every year starting at age 36.  Flexible sigmoidoscopy or colonoscopy. You may have a sigmoidoscopy every 5 years or a colonoscopy every 10 years starting at age 19.  Prostate cancer screening. Recommendations will vary depending on your family history and other risks.  Hepatitis C blood test.  Hepatitis B blood test.  Sexually transmitted disease (STD) testing.  Diabetes screening. This is done by checking your blood sugar (glucose) after you have not eaten for a while (fasting). You may have this done every 1-3 years.  Abdominal aortic aneurysm (AAA) screening. You may need this if you are a current or former smoker.  Osteoporosis. You may be screened starting at age 50 if you are at high risk. Talk with your health care provider about your test results, treatment options, and  if necessary, the need for more tests. Vaccines  Your health care provider may recommend certain vaccines, such as:  Influenza vaccine. This is recommended every year.  Tetanus, diphtheria, and acellular pertussis (Tdap, Td) vaccine. You may need a Td booster every 10 years.  Zoster vaccine.  You may need this after age 1.  Pneumococcal 13-valent conjugate (PCV13) vaccine. One dose is recommended after age 8.  Pneumococcal polysaccharide (PPSV23) vaccine. One dose is recommended after age 58. Talk to your health care provider about which screenings and vaccines you need and how often you need them. This information is not intended to replace advice given to you by your health care provider. Make sure you discuss any questions you have with your health care provider. Document Released: 06/24/2015 Document Revised: 02/15/2016 Document Reviewed: 03/29/2015 Elsevier Interactive Patient Education  2017 [employer] Prevention in the Home Falls can cause injuries. They can happen to people of all ages. There are many things you can do to make your home safe and to help prevent falls. What can I do on the outside of my home?  Regularly fix the edges of walkways and driveways and fix any cracks.  Remove anything that might make you trip as you walk through a door, such as a raised step or threshold.  Trim any bushes or trees on the path to your home.  Use bright outdoor lighting.  Clear any walking paths of anything that might make someone trip, such as rocks or tools.  Regularly check to see if handrails are loose or broken. Make sure that both sides of any steps have handrails.  Any raised decks and porches should have guardrails on the edges.  Have any leaves, snow, or ice cleared regularly.  Use sand or salt on walking paths during winter.  Clean up any spills in your garage right away. This includes oil or grease spills. What can I do in the bathroom?  Use night lights.  Install grab bars by the toilet and in the tub and shower. Do not use towel bars as grab bars.  Use non-skid mats or decals in the tub or shower.  If you need to sit down in the shower, use a plastic, non-slip stool.  Keep the floor dry. Clean up any water that spills on the floor as soon  as it happens.  Remove soap buildup in the tub or shower regularly.  Attach bath mats securely with double-sided non-slip rug tape.  Do not have throw rugs and other things on the floor that can make you trip. What can I do in the bedroom?  Use night lights.  Make sure that you have a light by your bed that is easy to reach.  Do not use any sheets or blankets that are too big for your bed. They should not hang down onto the floor.  Have a firm chair that has side arms. You can use this for support while you get dressed.  Do not have throw rugs and other things on the floor that can make you trip. What can I do in the kitchen?  Clean up any spills right away.  Avoid walking on wet floors.  Keep items that you use a lot in easy-to-reach places.  If you need to reach something above you, use a strong step stool that has a grab bar.  Keep electrical cords out of the way.  Do not use floor polish or wax that makes floors slippery. If you  must use wax, use non-skid floor wax.  Do not have throw rugs and other things on the floor that can make you trip. What can I do with my stairs?  Do not leave any items on the stairs.  Make sure that there are handrails on both sides of the stairs and use them. Fix handrails that are broken or loose. Make sure that handrails are as long as the stairways.  Check any carpeting to make sure that it is firmly attached to the stairs. Fix any carpet that is loose or worn.  Avoid having throw rugs at the top or bottom of the stairs. If you do have throw rugs, attach them to the floor with carpet tape.  Make sure that you have a light switch at the top of the stairs and the bottom of the stairs. If you do not have them, ask someone to add them for you. What else can I do to help prevent falls?  Wear shoes that:  Do not have high heels.  Have rubber bottoms.  Are comfortable and fit you well.  Are closed at the toe. Do not wear sandals.  If  you use a stepladder:  Make sure that it is fully opened. Do not climb a closed stepladder.  Make sure that both sides of the stepladder are locked into place.  Ask someone to hold it for you, if possible.  Clearly mark and make sure that you can see:  Any grab bars or handrails.  First and last steps.  Where the edge of each step is.  Use tools that help you move around (mobility aids) if they are needed. These include:  Canes.  Walkers.  Scooters.  Crutches.  Turn on the lights when you go into a dark area. Replace any light bulbs as soon as they burn out.  Set up your furniture so you have a clear path. Avoid moving your furniture around.  If any of your floors are uneven, fix them.  If there are any pets around you, be aware of where they are.  Review your medicines with your doctor. Some medicines can make you feel dizzy. This can increase your chance of falling. Ask your doctor what other things that you can do to help prevent falls. This information is not intended to replace advice given to you by your health care provider. Make sure you discuss any questions you have with your health care provider. Document Released: 03/24/2009 Document Revised: 11/03/2015 Document Reviewed: 07/02/2014 Elsevier Interactive Patient Education  2017 [employer].

## 2016-12-30 ENCOUNTER — Ambulatory Visit (HOSPITAL_COMMUNITY)
Admission: AD | Admit: 2016-12-30 | Discharge: 2016-12-30 | Disposition: A | Discharge status: Home / Self Care | Payer: Medicare Other | Source: Other Acute Inpatient Hospital | Attending: Emergency Medicine | Admitting: Emergency Medicine

## 2016-12-30 ENCOUNTER — Encounter: Payer: Self-pay | Admitting: Emergency Medicine

## 2016-12-30 ENCOUNTER — Emergency Department
Admission: EM | Admit: 2016-12-30 | Discharge: 2016-12-30 | Disposition: A | Discharge status: Other Acute Inpatient Hospital | Payer: Medicare Other | Attending: Emergency Medicine | Admitting: Emergency Medicine

## 2016-12-30 ENCOUNTER — Emergency Department: Payer: Medicare Other

## 2016-12-30 DIAGNOSIS — C7A8 Other malignant neuroendocrine tumors: Secondary | ICD-10-CM | POA: Diagnosis not present

## 2016-12-30 DIAGNOSIS — N400 Enlarged prostate without lower urinary tract symptoms: Secondary | ICD-10-CM | POA: Diagnosis not present

## 2016-12-30 DIAGNOSIS — K7689 Other specified diseases of liver: Secondary | ICD-10-CM | POA: Diagnosis not present

## 2016-12-30 DIAGNOSIS — R109 Unspecified abdominal pain: Secondary | ICD-10-CM | POA: Diagnosis not present

## 2016-12-30 DIAGNOSIS — R918 Other nonspecific abnormal finding of lung field: Secondary | ICD-10-CM | POA: Diagnosis not present

## 2016-12-30 DIAGNOSIS — J90 Pleural effusion, not elsewhere classified: Secondary | ICD-10-CM | POA: Diagnosis not present

## 2016-12-30 DIAGNOSIS — K567 Ileus, unspecified: Secondary | ICD-10-CM | POA: Diagnosis not present

## 2016-12-30 DIAGNOSIS — C7B8 Other secondary neuroendocrine tumors: Secondary | ICD-10-CM | POA: Diagnosis not present

## 2016-12-30 DIAGNOSIS — Z7984 Long term (current) use of oral hypoglycemic drugs: Secondary | ICD-10-CM | POA: Diagnosis not present

## 2016-12-30 DIAGNOSIS — Z79899 Other long term (current) drug therapy: Secondary | ICD-10-CM | POA: Insufficient documentation

## 2016-12-30 DIAGNOSIS — E119 Type 2 diabetes mellitus without complications: Secondary | ICD-10-CM | POA: Diagnosis not present

## 2016-12-30 DIAGNOSIS — G253 Myoclonus: Secondary | ICD-10-CM | POA: Diagnosis not present

## 2016-12-30 DIAGNOSIS — I2699 Other pulmonary embolism without acute cor pulmonale: Secondary | ICD-10-CM | POA: Diagnosis not present

## 2016-12-30 DIAGNOSIS — C229 Malignant neoplasm of liver, not specified as primary or secondary: Secondary | ICD-10-CM | POA: Diagnosis not present

## 2016-12-30 DIAGNOSIS — R6883 Chills (without fever): Secondary | ICD-10-CM | POA: Diagnosis not present

## 2016-12-30 DIAGNOSIS — R1011 Right upper quadrant pain: Secondary | ICD-10-CM | POA: Insufficient documentation

## 2016-12-30 DIAGNOSIS — R1084 Generalized abdominal pain: Secondary | ICD-10-CM | POA: Diagnosis not present

## 2016-12-30 DIAGNOSIS — C7B02 Secondary carcinoid tumors of liver: Secondary | ICD-10-CM | POA: Diagnosis not present

## 2016-12-30 DIAGNOSIS — G8918 Other acute postprocedural pain: Secondary | ICD-10-CM | POA: Diagnosis not present

## 2016-12-30 DIAGNOSIS — J9811 Atelectasis: Secondary | ICD-10-CM | POA: Diagnosis not present

## 2016-12-30 DIAGNOSIS — I10 Essential (primary) hypertension: Secondary | ICD-10-CM | POA: Diagnosis not present

## 2016-12-30 DIAGNOSIS — Z7982 Long term (current) use of aspirin: Secondary | ICD-10-CM | POA: Diagnosis not present

## 2016-12-30 DIAGNOSIS — I959 Hypotension, unspecified: Secondary | ICD-10-CM | POA: Diagnosis not present

## 2016-12-30 DIAGNOSIS — R1031 Right lower quadrant pain: Secondary | ICD-10-CM | POA: Diagnosis not present

## 2016-12-30 DIAGNOSIS — C221 Intrahepatic bile duct carcinoma: Secondary | ICD-10-CM | POA: Diagnosis not present

## 2016-12-30 DIAGNOSIS — K9189 Other postprocedural complications and disorders of digestive system: Secondary | ICD-10-CM | POA: Diagnosis not present

## 2016-12-30 DIAGNOSIS — R160 Hepatomegaly, not elsewhere classified: Secondary | ICD-10-CM | POA: Diagnosis not present

## 2016-12-30 DIAGNOSIS — D1803 Hemangioma of intra-abdominal structures: Secondary | ICD-10-CM | POA: Diagnosis not present

## 2016-12-30 DIAGNOSIS — R000 Tachycardia, unspecified: Secondary | ICD-10-CM | POA: Diagnosis not present

## 2016-12-30 DIAGNOSIS — R110 Nausea: Secondary | ICD-10-CM | POA: Diagnosis not present

## 2016-12-30 DIAGNOSIS — C7A012 Malignant carcinoid tumor of the ileum: Secondary | ICD-10-CM | POA: Diagnosis not present

## 2016-12-30 DIAGNOSIS — E785 Hyperlipidemia, unspecified: Secondary | ICD-10-CM | POA: Diagnosis not present

## 2016-12-30 DIAGNOSIS — I4510 Unspecified right bundle-branch block: Secondary | ICD-10-CM | POA: Diagnosis not present

## 2016-12-30 LAB — COMPREHENSIVE METABOLIC PANEL
ALBUMIN: 4.3 g/dL (ref 3.5–5.0)
ALT: 21 U/L (ref 17–63)
AST: 25 U/L (ref 15–41)
Alkaline Phosphatase: 70 U/L (ref 38–126)
Anion gap: 8 (ref 5–15)
BILIRUBIN TOTAL: 1.1 mg/dL (ref 0.3–1.2)
BUN: 17 mg/dL (ref 6–20)
CHLORIDE: 103 mmol/L (ref 101–111)
CO2: 26 mmol/L (ref 22–32)
Calcium: 9.1 mg/dL (ref 8.9–10.3)
Creatinine, Ser: 0.92 mg/dL (ref 0.61–1.24)
GFR calc Af Amer: >60 mL/min (ref >60)
GFR calc non Af Amer: >60 mL/min (ref >60)
GLUCOSE: 97 mg/dL (ref 65–99)
POTASSIUM: 4 mmol/L (ref 3.5–5.1)
Sodium: 137 mmol/L (ref 135–145)
TOTAL PROTEIN: 7.6 g/dL (ref 6.5–8.1)

## 2016-12-30 LAB — TROPONIN I: Troponin I: <0.03 ng/mL (ref <0.03)

## 2016-12-30 LAB — CBC
HEMATOCRIT: 41.4 % (ref 40.0–52.0)
Hemoglobin: 14.2 g/dL (ref 13.0–18.0)
MCH: 30.9 pg (ref 26.0–34.0)
MCHC: 34.4 g/dL (ref 32.0–36.0)
MCV: 90 fL (ref 80.0–100.0)
Platelets: 213 10*3/uL (ref 150–440)
RBC: 4.6 MIL/uL (ref 4.40–5.90)
RDW: 13.5 % (ref 11.5–14.5)
WBC: 7 10*3/uL (ref 3.8–10.6)

## 2016-12-30 LAB — LIPASE, BLOOD: Lipase: 31 U/L (ref 11–51)

## 2016-12-30 LAB — GLUCOSE, CAPILLARY: Glucose-Capillary: 95 mg/dL (ref 65–99)

## 2016-12-30 MED ORDER — IOPAMIDOL (ISOVUE-300) INJECTION 61%
30.0000 mL | Freq: Once | INTRAVENOUS | Status: AC | PRN
  Administered 2016-12-30: 30 mL via ORAL

## 2016-12-30 MED ORDER — ONDANSETRON HCL 4 MG/2ML IJ SOLN
4.0000 mg | Freq: Once | INTRAMUSCULAR | Status: AC
  Administered 2016-12-30: 4 mg via INTRAVENOUS
  Filled 2016-12-30: qty 2

## 2016-12-30 MED ORDER — IOPAMIDOL (ISOVUE-300) INJECTION 61%
100.0000 mL | Freq: Once | INTRAVENOUS | Status: AC | PRN
  Administered 2016-12-30: 100 mL via INTRAVENOUS

## 2016-12-30 MED ORDER — MORPHINE SULFATE (PF) 4 MG/ML IV SOLN
4.0000 mg | Freq: Once | INTRAVENOUS | Status: AC
  Administered 2016-12-30: 4 mg via INTRAVENOUS
  Filled 2016-12-30: qty 1

## 2016-12-30 NOTE — ED Notes (Signed)
Report given to [facility]. 

## 2016-12-30 NOTE — ED Notes (Signed)
Pt declines offer for pain medication in triage.

## 2016-12-30 NOTE — Consult Note (Signed)
SURGICAL CONSULTATION NOTE (initial) - cpt: 99255  HISTORY OF PRESENT ILLNESS (HPI):  66 y.o. male presented to [facility] ED with RUQ abdominal pain since last night. Patient states that he was feeling well in his usual state of health when last night after dinner he began experiencing Right-sided abdominal pain that has since worsened and become more focused in his RUQ. Patient otherwise reports +flatus and +BM WNL, denies any prior similar episodes/pain and denies any N/V, weight loss, fever, CP, or SOB with exertion. Patient also says his diabetes and HTN are both well-controlled with oral medication.  Surgery is consulted by ED physician Dr. Cinda Quest in this context for evaluation and management of RUQ abdominal pain.  PAST MEDICAL HISTORY (PMH):  Past Medical History:  Diagnosis Date  . Diabetes mellitus without complication ([place])   . Hyperlipidemia   . Hypertension      PAST SURGICAL HISTORY ([place]):  Past Surgical History:  Procedure Laterality Date  . TENDON GRAFT Right    right thumb tendon graft at age 64      MEDICATIONS:  Prior to Admission medications   Medication Sig Start Date End Date Taking? Authorizing Provider  aspirin EC 81 MG tablet Take 81 mg by mouth daily.   Yes [provider]  benazepril (LOTENSIN) 10 MG tablet Take 1 tablet (10 mg total) by mouth daily. 10/24/16  Yes Johnson, Megan P, DO  brimonidine (ALPHAGAN) 0.2 % ophthalmic solution Place 1 drop into the left eye daily.  09/12/15  Yes [provider]  ezetimibe (ZETIA) 10 MG tablet Take 1 tablet (10 mg total) by mouth daily. 10/24/16  Yes Johnson, Megan P, DO  metFORMIN (GLUCOPHAGE) 500 MG tablet Take 1 tablet (500 mg total) by mouth 2 (two) times daily. 10/22/16  Yes Johnson, Megan P, DO  simvastatin (ZOCOR) 40 MG tablet Take 1 tablet (40 mg total) by mouth daily. 10/24/16  Yes Johnson, Megan P, DO     ALLERGIES:  No Known Allergies   SOCIAL HISTORY:  Social History   Social History  .  Marital status: Married    Spouse name: N/A  . Number of children: N/A  . Years of education: N/A   Occupational History  . Not on file.   Social History Main Topics  . Smoking status: Never Smoker  . Smokeless tobacco: Never Used  . Alcohol use No  . Drug use: No  . Sexual activity: Not on file   Other Topics Concern  . Not on file   Social History Narrative  . No narrative on file    The patient currently resides (home / rehab facility / nursing home): Home  The patient normally is (ambulatory / bedbound): Ambulatory   FAMILY HISTORY:  Family History  Problem Relation Age of Onset  . Heart disease Father   . Diabetes Maternal Uncle   . Diabetes Maternal Uncle   . Diabetes Maternal Uncle   . Diabetes Maternal Uncle     Patient denies any family history of any cancer.   REVIEW OF SYSTEMS:  Constitutional: denies weight loss, fever, chills, or sweats  Eyes: denies any other vision changes, history of eye injury  ENT: denies sore throat, hearing problems  Respiratory: denies shortness of breath, wheezing  Cardiovascular: denies chest pain, palpitations  Gastrointestinal: abdominal pain, N/V, and bowel function as per HPI Genitourinary: denies burning with urination or urinary frequency Musculoskeletal: denies any other joint pains or cramps  Skin: denies any other rashes or skin discolorations  Neurological: denies any other headache, dizziness, weakness  Psychiatric: denies any other depression, anxiety   All other review of systems were negative   VITAL SIGNS:  Pulse Rate:  [77-82] 77 (07/22 0930) Resp:  [16] 16 (07/22 0930) BP: (121-132)/(71-74) 121/74 (07/22 0930) SpO2:  [95 %-96 %] 95 % (07/22 0930) Weight:  [208 lb (94.3 kg)] 208 lb (94.3 kg) (07/22 0641)     Height: 5\' 9"  (175.3 cm) Weight: 208 lb (94.3 kg) BMI (Calculated): 30.8   INTAKE/OUTPUT:  This shift: No intake/output data recorded.  Last 2 shifts: @IOLAST2SHIFTS @   PHYSICAL EXAM:   Constitutional:  -- Normal body habitus  -- Awake, alert, and oriented x3  Eyes:  -- Pupils equally round and reactive to light  -- No scleral icterus  Ear, nose, and throat:  -- No jugular venous distension  Pulmonary:  -- No crackles  -- Equal breath sounds bilaterally -- Breathing non-labored at rest Cardiovascular:  -- S1, S2 present  -- No pericardial rubs Gastrointestinal:  -- Abdomen soft and nondistended with moderate RUQ abdominal pain, no guarding/rebound  -- No abdominal masses appreciated, pulsatile or otherwise, except prominent liver edge upon inspiration  Musculoskeletal and Integumentary:  -- Wounds or skin discoloration: None appreciated -- Extremities: B/L UE and LE FROM, hands and feet warm, no edema  Neurologic:  -- Motor function: intact and symmetric -- Sensation: intact and symmetric  Labs:  CBC    Component Value Date/Time   WBC 7.0 12/30/2016 0643   RBC 4.60 12/30/2016 0643   HGB 14.2 12/30/2016 0643   HGB 13.9 05/17/2016 1507   HCT 41.4 12/30/2016 0643   HCT 41.6 05/17/2016 1507   PLT 213 12/30/2016 0643   PLT 214 05/17/2016 1507   MCV 90.0 12/30/2016 0643   MCV 89 05/17/2016 1507   MCH 30.9 12/30/2016 0643   MCHC 34.4 12/30/2016 0643   RDW 13.5 12/30/2016 0643   RDW 13.1 05/17/2016 1507   LYMPHSABS 1.3 05/17/2016 1507   EOSABS 0.2 05/17/2016 1507   BASOSABS 0.0 05/17/2016 1507   CMP Latest Ref Rng & Units 12/30/2016 05/17/2016 09/13/2015  Glucose 65 - 99 mg/dL 97 125(H) 153(H)  BUN 6 - 20 mg/dL 17 18 13   Creatinine 0.61 - 1.24 mg/dL 0.92 1.03 0.98  Sodium 135 - 145 mmol/L 137 140 139  Potassium 3.5 - 5.1 mmol/L 4.0 4.4 4.9  Chloride 101 - 111 mmol/L 103 99 99  CO2 22 - 32 mmol/L 26 27 25   Calcium 8.9 - 10.3 mg/dL 9.1 9.1 9.3  Total Protein 6.5 - 8.1 g/dL 7.6 6.6 -  Total Bilirubin 0.3 - 1.2 mg/dL 1.1 0.3 -  Alkaline Phos 38 - 126 U/L 70 72 -  AST 15 - 41 U/L 25 20 31   ALT 17 - 63 U/L 21 21 25     Imaging studies:  CT Abdomen and  Pelvis with IV Contrast (12/30/2016) - personally reviewed and discussed with patient Hepatobiliary: 12.0 x 9.0 x 11.5 cm complex cystic mass inferiorly in the posterior segment right hepatic lobe (coronal image 65). Associated layering fluid fluid level (series 2/ image 33). Associated soft tissue component which is worrisome for enhancement, although this cannot be clearly proven in the absence of precontrast evaluation (coronal image 68).  Notably, the lateral wall of the liver is bowed and thinned by the lesion, likely leading to pain due to capsular stretching, and worrisome for rupture.  Overall, this appearance favors hemorrhage into a biliary cystadenoma or less likely  biliary cystadenocarcinoma. In the setting of appropriate clinical symptoms (fever and leukocytosis), hepatic abscess would also be possible, but the imaging characteristics are less favorable for this diagnosis.  Gallbladder is underdistended but unremarkable. No intrahepatic or extrahepatic ductal dilatation.  Assessment/Plan: (ICD-10's: D13.4 vs C22.1) 66 y.o. male with RUQ abdominal pain and CT imaging findings of a 12 cm intrahepatic complex cystic mass with intralesional hemorrhage concerning for rare cystadenoma vs cystadenocarinoma (~10% malignant conversion), complicated by pertinent comorbidities including DM, HTN, and HLD.   - imaging study/findings reviewed with patient   - most likely diagnosis discussed with patient and his wife   [facility]  - anticipate hepatic resection (consensus recommendation for biliary cystadenoma)  - medical management of comorbidities  - DVT prophylaxis  All of the above findings and recommendations were discussed with the patient and his wife, and all of patient's and his wife's questions were answered to their expressed satisfaction.  Thank you for the opportunity to participate in this  patient's care.   -- Marilynne Drivers Rosana Hoes, MD, [place]: [facility] - Partnering for exceptional care. Office: [phone]

## 2016-12-30 NOTE — ED Notes (Signed)
Pt finished with contrast, CT notified. 

## 2016-12-30 NOTE — ED Triage Notes (Signed)
Pt states right mid abd pain that began yesterday at 1700 after eating almonds. Pt states pain has migrated to RUQ since onset and feels like it "is inside under my ribs" pt denies fever, diaphoresis, shob, nausea, vomiting, diarrhea. Pt states pain is worse with movement and deep inspiration.

## 2016-12-30 NOTE — ED Provider Notes (Addendum)
[facility] Emergency Department Provider Note   ____________________________________________   First MD Initiated Contact with Patient 12/30/16 [phone]     (approximate)  I have reviewed the triage vital signs and the nursing notes.   HISTORY  Chief Complaint Abdominal Pain    HPI Randy Harper is a 66 y.o. male who comes in reporting abdominal pain starting in mid and right lower abdomen yesterday evening after eating almonds. The pain has since moved up to the right upper quadrant. He is not had any nausea vomiting diarrhea fever chills or anything else says the pain is worse with movement or deep breathing but when I examined him there is no Murphy sign. Pain is a deep achy pain. Patient does not want any pain medicine at the present time. Pain radiates toward the back but does not reach the back   Past Medical History:  Diagnosis Date  . Diabetes mellitus without complication ([place])   . Hyperlipidemia   . Hypertension     Patient Active Problem List   Diagnosis Date Noted  . BPH (benign prostatic hyperplasia) 03/15/2015  . Hypertension   . Hyperlipidemia   . Diabetes mellitus without complication [facility])     Past Surgical History:  Procedure Laterality Date  . TENDON GRAFT Right    right thumb tendon graft at age 22     Prior to Admission medications   Medication Sig Start Date End Date Taking? Authorizing Provider  aspirin EC 81 MG tablet Take 81 mg by mouth daily.   Yes [provider]  benazepril (LOTENSIN) 10 MG tablet Take 1 tablet (10 mg total) by mouth daily. 10/24/16  Yes Johnson, Megan P, DO  brimonidine (ALPHAGAN) 0.2 % ophthalmic solution Place 1 drop into the left eye daily.  09/12/15  Yes [provider]  ezetimibe (ZETIA) 10 MG tablet Take 1 tablet (10 mg total) by mouth daily. 10/24/16  Yes Johnson, Megan P, DO  metFORMIN (GLUCOPHAGE) 500 MG tablet Take 1 tablet (500 mg total) by mouth 2 (two) times daily. 10/22/16   Yes Johnson, Megan P, DO  simvastatin (ZOCOR) 40 MG tablet Take 1 tablet (40 mg total) by mouth daily. 10/24/16  Yes Johnson, Megan P, DO    Allergies Patient has no known allergies.  Family History  Problem Relation Age of Onset  . Heart disease Father   . Diabetes Maternal Uncle   . Diabetes Maternal Uncle   . Diabetes Maternal Uncle   . Diabetes Maternal Uncle     Social History Social History  Substance Use Topics  . Smoking status: Never Smoker  . Smokeless tobacco: Never Used  . Alcohol use No    Review of Systems  Constitutional: No fever/chills Eyes: No visual changes. ENT: No sore throat. Cardiovascular: Denies chest pain. Respiratory: Denies shortness of breath. Gastrointestinal: See history of present illness Genitourinary: Negative for dysuria. Musculoskeletal: Negative for back pain. Skin: Negative for rash. Neurological: Negative for headaches, focal weakness or numbness.  ____________________________________________   PHYSICAL EXAM:  VITAL SIGNS: ED Triage Vitals  Enc Vitals Group     BP 12/30/16 0641 132/71     Pulse Rate 12/30/16 0641 82     Resp 12/30/16 0641 16     Temp --      Temp Source 12/30/16 0641 Oral     SpO2 12/30/16 0641 96 %     Weight 12/30/16 0641 208 lb (94.3 kg)     Height 12/30/16 0641 5\' 9"  (  1.753 m)     Head Circumference --      Peak Flow --      Pain Score 12/30/16 0639 8     Pain Loc --      Pain Edu? --      Excl. in [place]? --     Constitutional: Alert and oriented. Well appearing and in no acute distress. Eyes: Conjunctivae are normal.  Head: Atraumatic. Nose: No congestion/rhinnorhea. Mouth/Throat: Mucous membranes are moist.  Oropharynx non-erythematous. Neck: No stridor.  Cardiovascular: Normal rate, regular rhythm. Grossly normal heart sounds.  Good peripheral circulation. Respiratory: Normal respiratory effort.  No retractions. Lungs CTAB. Gastrointestinal: Soft tender in the right upper and lower  quadrants. Murphy sign is negative No distention. No abdominal bruits. No CVA tenderness. Musculoskeletal: No lower extremity tenderness nor edema.  No joint effusions. Neurologic:  Normal speech and language. No gross focal neurologic deficits are appreciated. No gait instability. Skin:  Skin is warm, dry and intact. No rash noted. Psychiatric: Mood and affect are normal. Speech and behavior are normal.  ____________________________________________   LABS (all labs ordered are listed, but only abnormal results are displayed)  Labs Reviewed  LIPASE, BLOOD  COMPREHENSIVE METABOLIC PANEL  CBC  TROPONIN I   ____________________________________________  EKG   ____________________________________________  RADIOLOGY   ____________________________________________   PROCEDURES  Procedure(s) performed:   Procedures  Critical Care performed:   ____________________________________________   INITIAL IMPRESSION / ASSESSMENT AND PLAN / ED COURSE  Pertinent labs & imaging results that were available during my care of the patient were reviewed by me and considered in my medical decision making (see chart for details).   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 Discussed this with the radiologist Dr. 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    Nena Polio, MD 12/30/16 1159 ----------------------------------------- 2:09 PM on 12/30/2016 -----------------------------------------  Patient continues to do well and is ready to go to Rosine Abe, MD 12/30/16 1409

## 2016-12-31 ENCOUNTER — Telehealth: Payer: Self-pay | Admitting: Family Medicine

## 2016-12-31 NOTE — Telephone Encounter (Signed)
Please see FYI message from front desk staff.  

## 2016-12-31 NOTE — Telephone Encounter (Signed)
FYI: patient would like for provider to know that he canceled his 01/02/2017 appointment due to being hospitalized at [facility] for a cyst on his liver. Patient informed that he will have to have surgery to get cyst removed. Surgery has not been scheduled as of yet.

## 2017-01-02 ENCOUNTER — Ambulatory Visit: Payer: Medicare Other | Admitting: Family Medicine

## 2017-01-03 DIAGNOSIS — C7B8 Other secondary neuroendocrine tumors: Secondary | ICD-10-CM | POA: Diagnosis not present

## 2017-01-09 DIAGNOSIS — C801 Malignant (primary) neoplasm, unspecified: Secondary | ICD-10-CM

## 2017-01-09 DIAGNOSIS — Z5189 Encounter for other specified aftercare: Secondary | ICD-10-CM

## 2017-01-09 HISTORY — DX: Malignant (primary) neoplasm, unspecified: C80.1

## 2017-01-09 HISTORY — PX: CHOLECYSTECTOMY: SHX55

## 2017-01-09 HISTORY — PX: SMALL INTESTINE SURGERY: SHX150

## 2017-01-09 HISTORY — DX: Encounter for other specified aftercare: Z51.89

## 2017-01-10 DIAGNOSIS — R160 Hepatomegaly, not elsewhere classified: Secondary | ICD-10-CM | POA: Insufficient documentation

## 2017-01-16 DIAGNOSIS — C7A8 Other malignant neuroendocrine tumors: Secondary | ICD-10-CM | POA: Insufficient documentation

## 2017-01-16 DIAGNOSIS — C7B8 Other secondary neuroendocrine tumors: Secondary | ICD-10-CM | POA: Insufficient documentation

## 2017-01-17 DIAGNOSIS — R160 Hepatomegaly, not elsewhere classified: Secondary | ICD-10-CM | POA: Diagnosis not present

## 2017-01-17 DIAGNOSIS — C7B8 Other secondary neuroendocrine tumors: Secondary | ICD-10-CM | POA: Diagnosis not present

## 2017-01-17 DIAGNOSIS — C7A8 Other malignant neuroendocrine tumors: Secondary | ICD-10-CM | POA: Diagnosis not present

## 2017-01-24 DIAGNOSIS — C7B8 Other secondary neuroendocrine tumors: Secondary | ICD-10-CM | POA: Diagnosis not present

## 2017-01-24 DIAGNOSIS — C7A8 Other malignant neuroendocrine tumors: Secondary | ICD-10-CM | POA: Diagnosis not present

## 2017-01-24 DIAGNOSIS — R000 Tachycardia, unspecified: Secondary | ICD-10-CM | POA: Diagnosis not present

## 2017-01-26 DIAGNOSIS — C787 Secondary malignant neoplasm of liver and intrahepatic bile duct: Secondary | ICD-10-CM | POA: Diagnosis not present

## 2017-01-26 DIAGNOSIS — Z9181 History of falling: Secondary | ICD-10-CM | POA: Diagnosis not present

## 2017-01-26 DIAGNOSIS — Z483 Aftercare following surgery for neoplasm: Secondary | ICD-10-CM | POA: Diagnosis not present

## 2017-01-26 DIAGNOSIS — Z48815 Encounter for surgical aftercare following surgery on the digestive system: Secondary | ICD-10-CM | POA: Diagnosis not present

## 2017-01-26 DIAGNOSIS — Z7984 Long term (current) use of oral hypoglycemic drugs: Secondary | ICD-10-CM | POA: Diagnosis not present

## 2017-01-26 DIAGNOSIS — Z7982 Long term (current) use of aspirin: Secondary | ICD-10-CM | POA: Diagnosis not present

## 2017-01-26 DIAGNOSIS — Z9049 Acquired absence of other specified parts of digestive tract: Secondary | ICD-10-CM | POA: Diagnosis not present

## 2017-01-26 DIAGNOSIS — C7A012 Malignant carcinoid tumor of the ileum: Secondary | ICD-10-CM | POA: Diagnosis not present

## 2017-01-26 DIAGNOSIS — I2699 Other pulmonary embolism without acute cor pulmonale: Secondary | ICD-10-CM | POA: Diagnosis not present

## 2017-01-29 DIAGNOSIS — I4892 Unspecified atrial flutter: Secondary | ICD-10-CM | POA: Insufficient documentation

## 2017-01-29 DIAGNOSIS — R000 Tachycardia, unspecified: Secondary | ICD-10-CM | POA: Diagnosis not present

## 2017-01-29 DIAGNOSIS — I10 Essential (primary) hypertension: Secondary | ICD-10-CM | POA: Diagnosis not present

## 2017-01-31 DIAGNOSIS — C7B8 Other secondary neuroendocrine tumors: Secondary | ICD-10-CM | POA: Diagnosis not present

## 2017-01-31 DIAGNOSIS — L918 Other hypertrophic disorders of the skin: Secondary | ICD-10-CM | POA: Diagnosis not present

## 2017-01-31 DIAGNOSIS — C7A8 Other malignant neuroendocrine tumors: Secondary | ICD-10-CM | POA: Diagnosis not present

## 2017-01-31 DIAGNOSIS — L929 Granulomatous disorder of the skin and subcutaneous tissue, unspecified: Secondary | ICD-10-CM | POA: Diagnosis not present

## 2017-02-19 ENCOUNTER — Ambulatory Visit (INDEPENDENT_AMBULATORY_CARE_PROVIDER_SITE_OTHER): Payer: Medicare Other | Admitting: Family Medicine

## 2017-02-19 ENCOUNTER — Encounter: Payer: Self-pay | Admitting: Family Medicine

## 2017-02-19 VITALS — BP 119/73 | HR 96 | Wt 201.0 lb

## 2017-02-19 DIAGNOSIS — Z23 Encounter for immunization: Secondary | ICD-10-CM

## 2017-02-19 DIAGNOSIS — I2699 Other pulmonary embolism without acute cor pulmonale: Secondary | ICD-10-CM | POA: Diagnosis not present

## 2017-02-19 DIAGNOSIS — I4891 Unspecified atrial fibrillation: Secondary | ICD-10-CM

## 2017-02-19 DIAGNOSIS — E782 Mixed hyperlipidemia: Secondary | ICD-10-CM

## 2017-02-19 DIAGNOSIS — Z09 Encounter for follow-up examination after completed treatment for conditions other than malignant neoplasm: Secondary | ICD-10-CM | POA: Diagnosis not present

## 2017-02-19 DIAGNOSIS — I10 Essential (primary) hypertension: Secondary | ICD-10-CM | POA: Diagnosis not present

## 2017-02-19 DIAGNOSIS — C221 Intrahepatic bile duct carcinoma: Secondary | ICD-10-CM | POA: Diagnosis not present

## 2017-02-19 DIAGNOSIS — E119 Type 2 diabetes mellitus without complications: Secondary | ICD-10-CM | POA: Diagnosis not present

## 2017-02-19 LAB — BAYER DCA HB A1C WAIVED: HB A1C: 5.5 % (ref <7.0)

## 2017-02-19 NOTE — Assessment & Plan Note (Signed)
Post surgery followed by Rob Hickman

## 2017-02-19 NOTE — Patient Instructions (Signed)

## 2017-02-19 NOTE — Assessment & Plan Note (Signed)
Diabetes with excellent control hemoglobin A1c of 5.5. May actually with weight loss and changes be able to either reduce and/or stop metformin will stop by cutting out evening dose of metformin continuing 500 in the morning for now. Patient's check blood sugars to confirm or adjust medication as appropriate.

## 2017-02-19 NOTE — Assessment & Plan Note (Addendum)
Post surgery in July. Placed on Xarelto in August. Recommendation for 3 months of treatment with anticoagulation. Will double check with [facility] to confirm that treatment recommendation is 3 months not 6 months for pulmonary embolus.

## 2017-02-19 NOTE — Progress Notes (Signed)
BP 119/73   Pulse 96   Wt 201 lb (91.2 kg)   SpO2 99%   BMI 29.68 kg/m    Subjective:    Patient ID: Randy Harper, male    DOB: 11-02-1950, 66 y.o.   MRN: [id number]  HPI: Randy Harper is a 66 y.o. male  Chief Complaint  Patient presents with  . Hospitalization Follow-up    Randy Harper is a 20 year [place] male transferred to [facility] 12/30/16 after presenting with acute onset of abdominal pain. CT scan demonstrated a 12 cm cystic lesion of the right lobe and additional adjacent lesion. No signs of active extravasation. Additional workup here included [place] discussion, MRI abdomen, CT AP, CT chest, and tumor markers including cea, ca 199, and AFP. Recommendation made for resection. He is now 9 Days Post-Op. Intraoperatively findings were consistent with neuroendocrine tumor notin  . Tachycardia    Is seeing cardiology   Diagnosis of malignant neoplasm of intrahepatic bile ducts was made with complete removal surgery. No plans now for radiation or chemotherapy. Patient recovering well from surgery. Getting strength back and feeling more normal. Recovery course was complicated by pulmonary embolus and atrial fibrillation. Patient was placed on Lovenox to start with which I transitioned to Xarelto. The patient is taking without problems no extra bleeding or bruising issues. Has transitioned to diltiazem for atrial fibrillation with stopping simvastatin and Benzapril. Blood pressures remained good remains on Zetia. Will address cholesterol at a later date. No issues with diabetes during hospitalizations. Patient hasn't had any further diabetes evaluation.  Relevant past medical, surgical, family and social history reviewed and updated as indicated. Interim medical history since our last visit reviewed. Allergies and medications reviewed and updated.  Review of Systems  Constitutional: Negative.   Respiratory: Negative.   Cardiovascular: Negative.     Per HPI unless specifically indicated  above     Objective:    BP 119/73   Pulse 96   Wt 201 lb (91.2 kg)   SpO2 99%   BMI 29.68 kg/m   Wt Readings from Last 3 Encounters:  02/19/17 201 lb (91.2 kg)  12/30/16 208 lb (94.3 kg)  12/19/16 214 lb 8 oz (97.3 kg)    Physical Exam  Constitutional: He is oriented to person, place, and time. He appears well-developed and well-nourished.  HENT:  Head: Normocephalic and atraumatic.  Eyes: Conjunctivae and EOM are normal.  Neck: Normal range of motion.  Cardiovascular: Normal rate, regular rhythm and normal heart sounds.   Pulmonary/Chest: Effort normal and breath sounds normal.  Musculoskeletal: Normal range of motion.  Neurological: He is alert and oriented to person, place, and time.  Skin: No erythema.  Psychiatric: He has a normal mood and affect. His behavior is normal. Judgment and thought content normal.    Results for orders placed or performed during the hospital encounter of 12/30/16  Lipase, blood  Result Value Ref Range   Lipase 31 11 - 51 U/L  Comprehensive metabolic panel  Result Value Ref Range   Sodium 137 135 - 145 mmol/L   Potassium 4.0 3.5 - 5.1 mmol/L   Chloride 103 101 - 111 mmol/L   CO2 26 22 - 32 mmol/L   Glucose, Bld 97 65 - 99 mg/dL   BUN 17 6 - 20 mg/dL   Creatinine, Ser 0.92 0.61 - 1.24 mg/dL   Calcium 9.1 8.9 - 10.3 mg/dL   Total Protein 7.6 6.5 - 8.1 g/dL   Albumin 4.3 3.5 -  5.0 g/dL   AST 25 15 - 41 U/L   ALT 21 17 - 63 U/L   Alkaline Phosphatase 70 38 - 126 U/L   Total Bilirubin 1.1 0.3 - 1.2 mg/dL   GFR calc non Af Amer >60 >60 mL/min   GFR calc Af Amer >60 >60 mL/min   Anion gap 8 5 - 15  CBC  Result Value Ref Range   WBC 7.0 3.8 - 10.6 K/uL   RBC 4.60 4.40 - 5.90 MIL/uL   Hemoglobin 14.2 13.0 - 18.0 g/dL   HCT 41.4 40.0 - 52.0 %   MCV 90.0 80.0 - 100.0 fL   MCH 30.9 26.0 - 34.0 pg   MCHC 34.4 32.0 - 36.0 g/dL   RDW 13.5 11.5 - 14.5 %   Platelets 213 150 - 440 K/uL  Troponin I  Result Value Ref Range   Troponin I  <0.03 <0.03 ng/mL  Glucose, capillary  Result Value Ref Range   Glucose-Capillary 95 65 - 99 mg/dL      Assessment & Plan:   Problem List Items Addressed This Visit      Cardiovascular and Mediastinum   Hypertension    Good control on Cardizem and with weight loss.      Relevant Medications   diltiazem (CARDIZEM CD) 240 MG 24 hr capsule   XARELTO 20 MG TABS tablet   Pulmonary embolus (HCC)    Post surgery in July. Placed on Xarelto in August. Recommendation for 3 months of treatment with anticoagulation. Will double check with [facility] to confirm that treatment recommendation is 3 months not 6 months for pulmonary embolus.      Relevant Medications   diltiazem (CARDIZEM CD) 240 MG 24 hr capsule   XARELTO 20 MG TABS tablet   Atrial fibrillation (HCC)    Atrial fibrillation followed by cardiology. Not sure if this is going to be permanent if so will need long-term anticoagulation. If just postsurgical atrial fibrillation anticoagulation for pulmonary embolus should be adequate. Cardiology is following.      Relevant Medications   diltiazem (CARDIZEM CD) 240 MG 24 hr capsule   XARELTO 20 MG TABS tablet     Digestive   Malignant neoplasm of intrahepatic bile ducts (HCC)    Post surgery followed by [facility]        Endocrine   Diabetes mellitus without complication ([place])    Diabetes with excellent control hemoglobin A1c of 5.5. May actually with weight loss and changes be able to either reduce and/or stop metformin will stop by cutting out evening dose of metformin continuing 500 in the morning for now. Patient's check blood sugars to confirm or adjust medication as appropriate.      Relevant Orders   Bayer DCA Hb A1c Waived     Other   Hyperlipidemia    Off simvastatin due to starting diltiazem for rate control with atrial fibrillation will observe for now      Relevant Medications   diltiazem (CARDIZEM CD) 240 MG 24 hr capsule   XARELTO 20 MG TABS tablet    Other Visit  Diagnoses    Hospital discharge follow-up    -  Primary   Needs flu shot       Relevant Orders   Flu vaccine HIGH DOSE PF (Fluzone High dose) (Completed)       Follow up plan: Return in about 3 months (around 05/21/2017) for Physical Exam.

## 2017-02-19 NOTE — Assessment & Plan Note (Signed)
Atrial fibrillation followed by cardiology. Not sure if this is going to be permanent if so will need long-term anticoagulation. If just postsurgical atrial fibrillation anticoagulation for pulmonary embolus should be adequate. Cardiology is following.

## 2017-02-19 NOTE — Assessment & Plan Note (Signed)
Good control on Cardizem and with weight loss.

## 2017-02-19 NOTE — Assessment & Plan Note (Signed)
Off simvastatin due to starting diltiazem for rate control with atrial fibrillation will observe for now

## 2017-02-21 DIAGNOSIS — C7A8 Other malignant neuroendocrine tumors: Secondary | ICD-10-CM | POA: Diagnosis not present

## 2017-02-21 DIAGNOSIS — C7B8 Other secondary neuroendocrine tumors: Secondary | ICD-10-CM | POA: Diagnosis not present

## 2017-02-26 DIAGNOSIS — E782 Mixed hyperlipidemia: Secondary | ICD-10-CM | POA: Diagnosis not present

## 2017-02-26 DIAGNOSIS — I4892 Unspecified atrial flutter: Secondary | ICD-10-CM | POA: Diagnosis not present

## 2017-02-26 DIAGNOSIS — I10 Essential (primary) hypertension: Secondary | ICD-10-CM | POA: Diagnosis not present

## 2017-03-07 DIAGNOSIS — C7B8 Other secondary neuroendocrine tumors: Secondary | ICD-10-CM | POA: Diagnosis not present

## 2017-03-07 DIAGNOSIS — C7A8 Other malignant neuroendocrine tumors: Secondary | ICD-10-CM | POA: Diagnosis not present

## 2017-03-25 DIAGNOSIS — H401131 Primary open-angle glaucoma, bilateral, mild stage: Secondary | ICD-10-CM | POA: Diagnosis not present

## 2017-03-25 DIAGNOSIS — H35712 Central serous chorioretinopathy, left eye: Secondary | ICD-10-CM | POA: Diagnosis not present

## 2017-03-25 DIAGNOSIS — E119 Type 2 diabetes mellitus without complications: Secondary | ICD-10-CM | POA: Diagnosis not present

## 2017-03-25 DIAGNOSIS — H359 Unspecified retinal disorder: Secondary | ICD-10-CM | POA: Diagnosis not present

## 2017-03-25 LAB — HM DIABETES EYE EXAM

## 2017-04-11 DIAGNOSIS — Z483 Aftercare following surgery for neoplasm: Secondary | ICD-10-CM | POA: Diagnosis not present

## 2017-04-11 DIAGNOSIS — C7A8 Other malignant neuroendocrine tumors: Secondary | ICD-10-CM | POA: Diagnosis not present

## 2017-04-11 DIAGNOSIS — Z9049 Acquired absence of other specified parts of digestive tract: Secondary | ICD-10-CM | POA: Diagnosis not present

## 2017-04-11 DIAGNOSIS — C787 Secondary malignant neoplasm of liver and intrahepatic bile duct: Secondary | ICD-10-CM | POA: Diagnosis not present

## 2017-04-11 DIAGNOSIS — C801 Malignant (primary) neoplasm, unspecified: Secondary | ICD-10-CM | POA: Diagnosis not present

## 2017-04-11 DIAGNOSIS — L929 Granulomatous disorder of the skin and subcutaneous tissue, unspecified: Secondary | ICD-10-CM | POA: Diagnosis not present

## 2017-04-11 DIAGNOSIS — C7B8 Other secondary neuroendocrine tumors: Secondary | ICD-10-CM | POA: Diagnosis not present

## 2017-04-23 ENCOUNTER — Other Ambulatory Visit: Payer: Self-pay | Admitting: Family Medicine

## 2017-04-23 DIAGNOSIS — E785 Hyperlipidemia, unspecified: Secondary | ICD-10-CM

## 2017-04-23 NOTE — Telephone Encounter (Signed)
Your patient 

## 2017-05-20 ENCOUNTER — Encounter: Payer: Medicare Other | Admitting: Family Medicine

## 2017-06-18 ENCOUNTER — Ambulatory Visit: Payer: Medicare Other | Admitting: Family Medicine

## 2017-06-18 ENCOUNTER — Encounter: Payer: Self-pay | Admitting: Family Medicine

## 2017-06-18 VITALS — BP 132/82 | HR 93 | Ht 69.69 in | Wt 213.0 lb

## 2017-06-18 DIAGNOSIS — Z0000 Encounter for general adult medical examination without abnormal findings: Secondary | ICD-10-CM

## 2017-06-18 DIAGNOSIS — E785 Hyperlipidemia, unspecified: Secondary | ICD-10-CM | POA: Diagnosis not present

## 2017-06-18 DIAGNOSIS — Z0001 Encounter for general adult medical examination with abnormal findings: Secondary | ICD-10-CM

## 2017-06-18 DIAGNOSIS — E782 Mixed hyperlipidemia: Secondary | ICD-10-CM | POA: Diagnosis not present

## 2017-06-18 DIAGNOSIS — I10 Essential (primary) hypertension: Secondary | ICD-10-CM | POA: Diagnosis not present

## 2017-06-18 DIAGNOSIS — N400 Enlarged prostate without lower urinary tract symptoms: Secondary | ICD-10-CM | POA: Diagnosis not present

## 2017-06-18 DIAGNOSIS — Z1329 Encounter for screening for other suspected endocrine disorder: Secondary | ICD-10-CM

## 2017-06-18 DIAGNOSIS — Z86711 Personal history of pulmonary embolism: Secondary | ICD-10-CM | POA: Diagnosis not present

## 2017-06-18 DIAGNOSIS — E119 Type 2 diabetes mellitus without complications: Secondary | ICD-10-CM

## 2017-06-18 DIAGNOSIS — C221 Intrahepatic bile duct carcinoma: Secondary | ICD-10-CM | POA: Diagnosis not present

## 2017-06-18 DIAGNOSIS — I2699 Other pulmonary embolism without acute cor pulmonale: Secondary | ICD-10-CM

## 2017-06-18 DIAGNOSIS — Z7189 Other specified counseling: Secondary | ICD-10-CM | POA: Insufficient documentation

## 2017-06-18 LAB — URINALYSIS, ROUTINE W REFLEX MICROSCOPIC
Bilirubin, UA: NEGATIVE
Glucose, UA: NEGATIVE
Ketones, UA: NEGATIVE
LEUKOCYTES UA: NEGATIVE
Nitrite, UA: NEGATIVE
PH UA: 6 (ref 5.0–7.5)
Protein, UA: NEGATIVE
SPEC GRAV UA: 1.015 (ref 1.005–1.030)
Urobilinogen, Ur: 0.2 mg/dL (ref 0.2–1.0)

## 2017-06-18 LAB — MICROSCOPIC EXAMINATION: Bacteria, UA: NONE SEEN

## 2017-06-18 LAB — BAYER DCA HB A1C WAIVED: HB A1C (BAYER DCA - WAIVED): 5.7 % (ref <7.0)

## 2017-06-18 MED ORDER — METFORMIN HCL 500 MG PO TABS: 500.0000 mg | ORAL_TABLET | Freq: Every day | ORAL | 4 refills | Status: DC

## 2017-06-18 MED ORDER — EZETIMIBE 10 MG PO TABS: 10.0000 mg | ORAL_TABLET | Freq: Every day | ORAL | 4 refills | Status: DC

## 2017-06-18 MED ORDER — DILTIAZEM HCL ER COATED BEADS 240 MG PO CP24: 240.0000 mg | ORAL_CAPSULE | Freq: Every day | ORAL | 4 refills | Status: DC

## 2017-06-18 NOTE — Assessment & Plan Note (Signed)
The current medical regimen is effective;  continue present plan and medications.  

## 2017-06-18 NOTE — Assessment & Plan Note (Signed)
Resolved and has stopped meds

## 2017-06-18 NOTE — Progress Notes (Signed)
BP 132/82   Pulse 93   Ht 5' 9.69" (1.77 m)   Wt 213 lb (96.6 kg)   SpO2 99%   BMI 30.84 kg/m    Subjective:    Patient ID: Randy Harper, male    DOB: 07-Aug-1950, 67 y.o.   MRN: [id number]  HPI: Randy Harper is a 67 y.o. male  Chief Complaint  Patient presents with  . Annual Exam   Patient all in all doing very well has some occasionalvertigo mostly positional. Has been ongoing the last month or so helped with some Sudafed Patient's been able to stop the Xarelto no further pulmonary embolus symptoms no further atrial fibrillation. Also getting good reports on hepatic  Carcinoma No low blood sugar spells taking metformin 500 mg just once a day as is lost weight nicely Also taking Zetia without problems Relevant past medical, surgical, family and social history reviewed and updated as indicated. Interim medical history since our last visit reviewed. Allergies and medications reviewed and updated.  Review of Systems  Constitutional: Negative.   HENT: Negative.   Eyes: Negative.   Respiratory: Negative.   Cardiovascular: Negative.   Gastrointestinal: Negative.   Endocrine: Negative.   Genitourinary: Negative.   Musculoskeletal: Negative.   Skin: Negative.   Allergic/Immunologic: Negative.   Neurological: Negative.   Hematological: Negative.   Psychiatric/Behavioral: Negative.     Per HPI unless specifically indicated above     Objective:    BP 132/82   Pulse 93   Ht 5' 9.69" (1.77 m)   Wt 213 lb (96.6 kg)   SpO2 99%   BMI 30.84 kg/m   Wt Readings from Last 3 Encounters:  06/18/17 213 lb (96.6 kg)  02/19/17 201 lb (91.2 kg)  12/30/16 208 lb (94.3 kg)    Physical Exam  Constitutional: He is oriented to person, place, and time. He appears well-developed and well-nourished.  HENT:  Head: Normocephalic and atraumatic.  Right Ear: External ear normal.  Left Ear: External ear normal.  Eyes: Conjunctivae and EOM are normal. Pupils are equal, round, and  reactive to light.  Neck: Normal range of motion. Neck supple.  Cardiovascular: Normal rate, regular rhythm, normal heart sounds and intact distal pulses.  Pulmonary/Chest: Effort normal and breath sounds normal.  Abdominal: Soft. Bowel sounds are normal. There is no splenomegaly or hepatomegaly.  Genitourinary: Rectum normal and penis normal.  Genitourinary Comments: BPH changes  Musculoskeletal: Normal range of motion.  Neurological: He is alert and oriented to person, place, and time. He has normal reflexes.  Skin: No rash noted. No erythema.  Psychiatric: He has a normal mood and affect. His behavior is normal. Judgment and thought content normal.    Results for orders placed or performed in visit on 02/19/17  Bayer DCA Hb A1c Waived  Result Value Ref Range   Bayer DCA Hb A1c Waived 5.5 <7.0 %      Assessment & Plan:   Problem List Items Addressed This Visit      Cardiovascular and Mediastinum   Hypertension - Primary    The current medical regimen is effective;  continue present plan and medications.       Relevant Medications   ezetimibe (ZETIA) 10 MG tablet   diltiazem (CARDIZEM CD) 240 MG 24 hr capsule   Other Relevant Orders   CBC with Differential/Platelet   Comprehensive metabolic panel   Lipid panel   Urinalysis, Routine w reflex microscopic   Pulmonary embolus (HCC)  Resolved and has stopped meds      Relevant Medications   ezetimibe (ZETIA) 10 MG tablet   diltiazem (CARDIZEM CD) 240 MG 24 hr capsule     Digestive   Malignant neoplasm of intrahepatic bile ducts (HCC)    Followed at [facility] and stable        Endocrine   Diabetes mellitus without complication ([place])    The current medical regimen is effective;  continue present plan and medications.       Relevant Medications   metFORMIN (GLUCOPHAGE) 500 MG tablet   Other Relevant Orders   CBC with Differential/Platelet   Comprehensive metabolic panel   Lipid panel   Urinalysis, Routine w reflex  microscopic   Bayer DCA Hb A1c Waived     Genitourinary   BPH (benign prostatic hyperplasia)   Relevant Orders   PSA     Other   Hyperlipidemia    The current medical regimen is effective;  continue present plan and medications.       Relevant Medications   ezetimibe (ZETIA) 10 MG tablet   diltiazem (CARDIZEM CD) 240 MG 24 hr capsule   Other Relevant Orders   CBC with Differential/Platelet   Comprehensive metabolic panel   Lipid panel   Urinalysis, Routine w reflex microscopic   Advanced care planning/counseling discussion    A voluntary discussion about advance care planning including the explanation and discussion of advance directives was extensively discussed  with the patient.  Explanation about the health care proxy and Living will was reviewed and packet with forms with explanation of how to fill them out was given.          Other Visit Diagnoses    Thyroid disorder screen       Relevant Orders   TSH   PE (physical exam), annual           Follow up plan: Return in about 6 months (around 12/16/2017) for Hemoglobin A1c, BMP,  Lipids, ALT, AST.

## 2017-06-18 NOTE — Assessment & Plan Note (Signed)
A voluntary discussion about advance care planning including the explanation and discussion of advance directives was extensively discussed  with the patient.  Explanation about the health care proxy and Living will was reviewed and packet with forms with explanation of how to fill them out was given.    

## 2017-06-18 NOTE — Assessment & Plan Note (Signed)
Followed at [facility] and stable 

## 2017-06-19 ENCOUNTER — Telehealth: Payer: Self-pay | Admitting: Family Medicine

## 2017-06-19 LAB — COMPREHENSIVE METABOLIC PANEL
ALBUMIN: 4.2 g/dL (ref 3.6–4.8)
ALK PHOS: 105 IU/L (ref 39–117)
ALT: 15 IU/L (ref 0–44)
AST: 20 IU/L (ref 0–40)
Albumin/Globulin Ratio: 1.4 (ref 1.2–2.2)
BUN / CREAT RATIO: 14 (ref 10–24)
BUN: 11 mg/dL (ref 8–27)
CHLORIDE: 102 mmol/L (ref 96–106)
CO2: 23 mmol/L (ref 20–29)
CREATININE: 0.8 mg/dL (ref 0.76–1.27)
Calcium: 8.8 mg/dL (ref 8.6–10.2)
GFR calc Af Amer: 106 mL/min/{1.73_m2} (ref >59)
GFR calc non Af Amer: 92 mL/min/{1.73_m2} (ref >59)
GLOBULIN, TOTAL: 3 g/dL (ref 1.5–4.5)
Glucose: 170 mg/dL — ABNORMAL HIGH (ref 65–99)
Potassium: 4.3 mmol/L (ref 3.5–5.2)
SODIUM: 142 mmol/L (ref 134–144)
Total Protein: 7.2 g/dL (ref 6.0–8.5)

## 2017-06-19 LAB — CBC WITH DIFFERENTIAL/PLATELET
Basophils Absolute: 0 10*3/uL (ref 0.0–0.2)
Basos: 0 %
EOS (ABSOLUTE): 0.1 10*3/uL (ref 0.0–0.4)
EOS: 1 %
HEMOGLOBIN: 11 g/dL — AB (ref 13.0–17.7)
Hematocrit: 37.2 % — ABNORMAL LOW (ref 37.5–51.0)
Immature Grans (Abs): 0 10*3/uL (ref 0.0–0.1)
Immature Granulocytes: 0 %
LYMPHS ABS: 1 10*3/uL (ref 0.7–3.1)
Lymphs: 19 %
MCH: 21.9 pg — ABNORMAL LOW (ref 26.6–33.0)
MCHC: 29.6 g/dL — AB (ref 31.5–35.7)
MCV: 74 fL — ABNORMAL LOW (ref 79–97)
MONOCYTES: 9 %
Monocytes Absolute: 0.5 10*3/uL (ref 0.1–0.9)
Neutrophils Absolute: 3.9 10*3/uL (ref 1.4–7.0)
Neutrophils: 71 %
Platelets: 218 10*3/uL (ref 150–379)
RBC: 5.02 x10E6/uL (ref 4.14–5.80)
RDW: 16.9 % — ABNORMAL HIGH (ref 12.3–15.4)
WBC: 5.5 10*3/uL (ref 3.4–10.8)

## 2017-06-19 LAB — TSH: TSH: 0.926 u[IU]/mL (ref 0.450–4.500)

## 2017-06-19 LAB — LIPID PANEL
CHOLESTEROL TOTAL: 144 mg/dL (ref 100–199)
Chol/HDL Ratio: 3.2 ratio (ref 0.0–5.0)
HDL: 45 mg/dL (ref >39)
LDL CALC: 77 mg/dL (ref 0–99)
Triglycerides: 108 mg/dL (ref 0–149)
VLDL Cholesterol Cal: 22 mg/dL (ref 5–40)

## 2017-06-19 LAB — PSA: Prostate Specific Ag, Serum: 1 ng/mL (ref 0.0–4.0)

## 2017-06-19 NOTE — Telephone Encounter (Signed)
Phone call Discussed with patient anemia patient's anemia markedly improved from hospitalization and continuing to improve discuss vitamins and iron Will recheck CBC next office visit.

## 2017-07-01 ENCOUNTER — Other Ambulatory Visit: Payer: Self-pay | Admitting: Family Medicine

## 2017-07-01 DIAGNOSIS — E785 Hyperlipidemia, unspecified: Secondary | ICD-10-CM

## 2017-07-09 DIAGNOSIS — E782 Mixed hyperlipidemia: Secondary | ICD-10-CM | POA: Diagnosis not present

## 2017-07-09 DIAGNOSIS — I4892 Unspecified atrial flutter: Secondary | ICD-10-CM | POA: Diagnosis not present

## 2017-07-09 DIAGNOSIS — I10 Essential (primary) hypertension: Secondary | ICD-10-CM | POA: Diagnosis not present

## 2017-07-18 DIAGNOSIS — Z85068 Personal history of other malignant neoplasm of small intestine: Secondary | ICD-10-CM | POA: Diagnosis not present

## 2017-07-18 DIAGNOSIS — C7A8 Other malignant neuroendocrine tumors: Secondary | ICD-10-CM | POA: Diagnosis not present

## 2017-07-18 DIAGNOSIS — C7B8 Other secondary neuroendocrine tumors: Secondary | ICD-10-CM | POA: Diagnosis not present

## 2017-07-18 DIAGNOSIS — I2699 Other pulmonary embolism without acute cor pulmonale: Secondary | ICD-10-CM | POA: Diagnosis not present

## 2017-07-18 DIAGNOSIS — R918 Other nonspecific abnormal finding of lung field: Secondary | ICD-10-CM | POA: Diagnosis not present

## 2017-07-18 DIAGNOSIS — Z8505 Personal history of malignant neoplasm of liver: Secondary | ICD-10-CM | POA: Diagnosis not present

## 2017-07-18 DIAGNOSIS — Z08 Encounter for follow-up examination after completed treatment for malignant neoplasm: Secondary | ICD-10-CM | POA: Diagnosis not present

## 2017-07-18 DIAGNOSIS — Z9049 Acquired absence of other specified parts of digestive tract: Secondary | ICD-10-CM | POA: Diagnosis not present

## 2017-10-15 DIAGNOSIS — C7A8 Other malignant neuroendocrine tumors: Secondary | ICD-10-CM | POA: Diagnosis not present

## 2017-10-15 DIAGNOSIS — R911 Solitary pulmonary nodule: Secondary | ICD-10-CM | POA: Diagnosis not present

## 2017-10-15 DIAGNOSIS — Z9049 Acquired absence of other specified parts of digestive tract: Secondary | ICD-10-CM | POA: Diagnosis not present

## 2017-10-15 DIAGNOSIS — D3A8 Other benign neuroendocrine tumors: Secondary | ICD-10-CM | POA: Diagnosis not present

## 2017-10-15 DIAGNOSIS — C7B8 Other secondary neuroendocrine tumors: Secondary | ICD-10-CM | POA: Diagnosis not present

## 2017-10-15 DIAGNOSIS — Z9889 Other specified postprocedural states: Secondary | ICD-10-CM | POA: Diagnosis not present

## 2017-11-05 DIAGNOSIS — I4892 Unspecified atrial flutter: Secondary | ICD-10-CM | POA: Diagnosis not present

## 2017-11-05 DIAGNOSIS — I10 Essential (primary) hypertension: Secondary | ICD-10-CM | POA: Diagnosis not present

## 2017-11-05 DIAGNOSIS — E119 Type 2 diabetes mellitus without complications: Secondary | ICD-10-CM | POA: Diagnosis not present

## 2017-11-05 DIAGNOSIS — E782 Mixed hyperlipidemia: Secondary | ICD-10-CM | POA: Diagnosis not present

## 2017-12-17 ENCOUNTER — Ambulatory Visit: Payer: Medicare Other | Admitting: Family Medicine

## 2017-12-23 ENCOUNTER — Ambulatory Visit (INDEPENDENT_AMBULATORY_CARE_PROVIDER_SITE_OTHER): Payer: Medicare Other | Admitting: Family Medicine

## 2017-12-23 ENCOUNTER — Encounter: Payer: Self-pay | Admitting: Family Medicine

## 2017-12-23 ENCOUNTER — Ambulatory Visit (INDEPENDENT_AMBULATORY_CARE_PROVIDER_SITE_OTHER): Payer: Medicare Other

## 2017-12-23 VITALS — BP 118/70 | HR 72 | Ht 69.0 in | Wt 217.0 lb

## 2017-12-23 VITALS — BP 118/70 | HR 72 | Temp 97.6°F | Resp 16 | Ht 69.0 in | Wt 217.9 lb

## 2017-12-23 DIAGNOSIS — E782 Mixed hyperlipidemia: Secondary | ICD-10-CM

## 2017-12-23 DIAGNOSIS — Z0000 Encounter for general adult medical examination without abnormal findings: Secondary | ICD-10-CM | POA: Diagnosis not present

## 2017-12-23 DIAGNOSIS — D649 Anemia, unspecified: Secondary | ICD-10-CM | POA: Diagnosis not present

## 2017-12-23 DIAGNOSIS — I10 Essential (primary) hypertension: Secondary | ICD-10-CM | POA: Diagnosis not present

## 2017-12-23 DIAGNOSIS — E119 Type 2 diabetes mellitus without complications: Secondary | ICD-10-CM

## 2017-12-23 DIAGNOSIS — C221 Intrahepatic bile duct carcinoma: Secondary | ICD-10-CM | POA: Diagnosis not present

## 2017-12-23 LAB — LP+ALT+AST PICCOLO, WAIVED
ALT (SGPT) Piccolo, Waived: 24 U/L (ref 10–47)
AST (SGOT) Piccolo, Waived: 29 U/L (ref 11–38)
CHOL/HDL RATIO PICCOLO,WAIVE: 3.2 mg/dL
Cholesterol Piccolo, Waived: 141 mg/dL (ref <200)
HDL Chol Piccolo, Waived: 44 mg/dL — ABNORMAL LOW (ref >59)
LDL Chol Calc Piccolo Waived: 67 mg/dL (ref <100)
TRIGLYCERIDES PICCOLO,WAIVED: 151 mg/dL — AB (ref <150)
VLDL CHOL CALC PICCOLO,WAIVE: 30 mg/dL — AB (ref <30)

## 2017-12-23 LAB — BAYER DCA HB A1C WAIVED: HB A1C (BAYER DCA - WAIVED): 5.5 % (ref <7.0)

## 2017-12-23 NOTE — Assessment & Plan Note (Signed)
The current medical regimen is effective;  continue present plan and medications.  

## 2017-12-23 NOTE — Progress Notes (Signed)
Subjective:   Randy Harper is a 67 y.o. male who presents for Medicare Annual/Subsequent preventive examination.  Review of Systems:   Cardiac Risk Factors include: advanced age (>105men, >50 women);diabetes mellitus;hypertension;dyslipidemia;male gender;obesity (BMI >30kg/m2)     Objective:    Vitals: BP 118/70 (BP Location: Left Arm, Patient Position: Sitting)   Pulse 72   Temp 97.6 F (36.4 C) (Temporal)   Resp 16   Ht 5\' 9"  (1.753 m)   Wt 217 lb 14.4 oz (98.8 kg)   SpO2 96%   BMI 32.18 kg/m   Body mass index is 32.18 kg/m.  Advanced Directives 12/23/2017 12/19/2016  Does Patient Have a Medical Advance Directive? Yes Yes  Type of Advance Directive Living will;Healthcare Power of [place];Living will  Copy of [place] in Chart? No - copy requested No - copy requested    Tobacco Social History   Tobacco Use  Smoking Status Never Smoker  Smokeless Tobacco Never Used     Counseling given: Not Answered   Clinical Intake:  Pre-visit preparation completed: Yes  Pain : No/denies pain     Nutritional Status: BMI > 30  Obese Nutritional Risks: None Diabetes: Yes CBG done?: No Did pt. bring in CBG monitor from home?: No  How often do you need to have someone help you when you read instructions, pamphlets, or other written materials from your doctor or pharmacy?: 1 - Never What is the last grade level you completed in school?: masters degree  Interpreter Needed?: No  Information entered by :: Tiffany Hill,LPN   Past Medical History:  Diagnosis Date  . Diabetes mellitus without complication ([place])   . Hyperlipidemia   . Hypertension    Past Surgical History:  Procedure Laterality Date  . TENDON GRAFT Right    right thumb tendon graft at age 29    Family History  Problem Relation Age of Onset  . Heart disease Father   . Diabetes Maternal Uncle   . Diabetes Maternal Uncle   . Diabetes Maternal Uncle   .  Diabetes Maternal Uncle    Social History   Socioeconomic History  . Marital status: Married    Spouse name: Not on file  . Number of children: Not on file  . Years of education: Not on file  . Highest education level: Master's degree (e.g., MA, MS, MEng, MEd, MSW, MBA)  Occupational History  . Not on file  Social Needs  . Financial resource strain: Not hard at all  . Food insecurity:    Worry: Never true    Inability: Never true  . Transportation needs:    Medical: No    Non-medical: No  Tobacco Use  . Smoking status: Never Smoker  . Smokeless tobacco: Never Used  Substance and Sexual Activity  . Alcohol use: No  . Drug use: No  . Sexual activity: Not on file  Lifestyle  . Physical activity:    Days per week: 5 days    Minutes per session: 30 min  . Stress: Not at all  Relationships  . Social connections:    Talks on phone: More than three times a week    Gets together: More than three times a week    Attends religious service: More than 4 times per year    Active member of club or organization: No    Attends meetings of clubs or organizations: Never    Relationship status: Married  Other Topics Concern  .  Not on file  Social History Narrative  . Not on file    Outpatient Encounter Medications as of 12/23/2017  Medication Sig  . aspirin EC 81 MG tablet Take 81 mg by mouth daily.  [name] atorvastatin (LIPITOR) 20 MG tablet Take by mouth.  . brimonidine (ALPHAGAN) 0.2 % ophthalmic solution Place 1 drop into the left eye daily.   [name] diltiazem (CARDIZEM CD) 240 MG 24 hr capsule Take 1 capsule (240 mg total) by mouth daily.  . metFORMIN (GLUCOPHAGE) 500 MG tablet Take 1 tablet (500 mg total) by mouth daily with breakfast.  . ezetimibe (ZETIA) 10 MG tablet TAKE 1 TABLET BY MOUTH  DAILY (Patient not taking: Reported on 12/23/2017)   No facility-administered encounter medications on file as of 12/23/2017.     Activities of Daily Living In your present state of health, do  you have any difficulty performing the following activities: 12/23/2017  Hearing? N  Vision? Y  Comment left eye problems- being treated by Dr.Woodard  Difficulty concentrating or making decisions? N  Walking or climbing stairs? N  Dressing or bathing? N  Doing errands, shopping? N  Preparing Food and eating ? N  Using the Toilet? N  In the past six months, have you accidently leaked urine? N  Do you have problems with loss of bowel control? N  Managing your Medications? N  Managing your Finances? N  Housekeeping or managing your Housekeeping? N  Some recent data might be hidden    Patient Care Team: [name], MD as PCP - General (Family Medicine) Anell Barr, [place] (Optometry) Earnestine Leys, MD (Specialist) Barbette Merino, NP as Nurse Practitioner (Nurse Practitioner) Corey Skains, MD as Consulting Physician (Cardiology) Blanca Friend, MD as Referring Physician (Oncology)   Assessment:   This is a routine wellness examination for [facility].  Exercise Activities and Dietary recommendations Current Exercise Habits: Home exercise routine, Type of exercise: walking, Time (Minutes): 30, Frequency (Times/Week): 5, Weekly Exercise (Minutes/Week): 150, Intensity: Mild, Exercise limited by: None identified  Goals    . DIET - INCREASE WATER INTAKE     Recommend drinking at least 6-8 glasses of water a day        Fall Risk Fall Risk  12/23/2017 06/18/2017 02/19/2017 12/19/2016 05/17/2016  Falls in the past year? No No No No Yes  Number falls in past yr: - - - - 1  Injury with Fall? - - - - Yes  [place] off latter   Is the patient's home free of loose throw rugs in walkways, pet beds, electrical cords, etc?   yes      Grab bars in the bathroom? yes      Handrails on the stairs?   yes      Adequate lighting?   yes  Timed Get Up and Go Performed: Completed in 8 seconds with no use of assistive devices, steady gait. No intervention needed at this time.    Depression Screen PHQ 2/9 Scores 12/23/2017 06/18/2017 02/19/2017 12/19/2016  PHQ - 2 Score 0 0 0 0    Cognitive Function     6CIT Screen 12/23/2017 12/19/2016  What Year? 0 points 0 points  What month? 0 points 0 points  What time? 0 points 0 points  Count back from 20 0 points 0 points  Months in reverse 0 points 0 points  Repeat phrase 0 points 2 points  Total Score 0 2    Immunization History  Administered Date(s) Administered  . Influenza, High Dose Seasonal PF 02/19/2017  . Influenza,inj,Quad PF,6+ Mos 03/15/2015  . Influenza-Unspecified 04/11/2016  . Pneumococcal Conjugate-13 02/03/2014  . Pneumococcal Polysaccharide-23 01/19/2016  . Pneumococcal-Unspecified 11/29/2010  . Td 04/20/2008  . Zoster 05/30/2009  . Zoster Recombinat (Shingrix) 12/09/2017    Qualifies for Shingles Vaccine? Yes, has received first shingrix vaccine.   Screening Tests Health Maintenance  Topic Date Due  . URINE MICROALBUMIN  10/06/1958  . FOOT EXAM  06/14/2016  . OPHTHALMOLOGY EXAM  02/20/2017  . HEMOGLOBIN A1C  12/16/2017  . INFLUENZA VACCINE  01/09/2018  . TETANUS/TDAP  04/20/2018  . COLONOSCOPY  05/25/2019  . Hepatitis C Screening  Completed  . PNA vac Low Risk Adult  Completed   Cancer Screenings: Lung: Low Dose CT Chest recommended if Age 43-80 years, 30 pack-year currently smoking OR have quit w/in 15years. Patient does not qualify. Colorectal: completed 05/24/2009  Additional Screenings:  Hepatitis C Screening: completed 06/15/2015      Plan:    I have personally reviewed and addressed the Medicare Annual Wellness questionnaire and have noted the following in the patient's chart:  A. Medical and social history B. Use of alcohol, tobacco or illicit drugs  C. Current medications and supplements D. Functional ability and status E.  Nutritional status F.  Physical activity G. Advance directives H. List of other physicians I.  Hospitalizations, surgeries, and ER visits in  previous 12 months J.  [place] such as hearing and vision if needed, cognitive and depression L. Referrals and appointments   In addition, I have reviewed and discussed with patient certain preventive protocols, quality metrics, and best practice recommendations. A written personalized care plan for preventive services as well as general preventive health recommendations were provided to patient.   Signed,  Tyler Aas, LPN Nurse Health Advisor   Nurse Notes: due for diabetic foot exam.   Has completed diabetic eye exam- called Dr.Woodard's office for results- they will fax these over today.

## 2017-12-23 NOTE — Assessment & Plan Note (Signed)
Followed by [facility]. 

## 2017-12-23 NOTE — Progress Notes (Signed)
BP 118/70   Pulse 72   Ht 5\' 9"  (1.753 m)   Wt 217 lb (98.4 kg)   BMI 32.05 kg/m    Subjective:    Patient ID: Randy Harper, male    DOB: 1951/06/11, 67 y.o.   MRN: [id number]  HPI: Randy Harper is a 67 y.o. male  Follow-up diabetes hypertension hypercholesterol. Patient followed by [facility] for liver biliary duct cancer.  Getting good reports going every 3 months. Diabetes no low blood sugar spells taking metformin 500 once each morning.  Doing well with cholesterol only taking atorvastatin not taking Zetia anymore. Blood pressure diltiazem doing well with no complaints.  Relevant past medical, surgical, family and social history reviewed and updated as indicated. Interim medical history since our last visit reviewed. Allergies and medications reviewed and updated.  Review of Systems  Constitutional: Negative.   Respiratory: Negative.   Cardiovascular: Negative.     Per HPI unless specifically indicated above     Objective:    BP 118/70   Pulse 72   Ht 5\' 9"  (1.753 m)   Wt 217 lb (98.4 kg)   BMI 32.05 kg/m   Wt Readings from Last 3 Encounters:  12/23/17 217 lb (98.4 kg)  12/23/17 217 lb 14.4 oz (98.8 kg)  06/18/17 213 lb (96.6 kg)    Physical Exam  Constitutional: He is oriented to person, place, and time. He appears well-developed and well-nourished.  HENT:  Head: Normocephalic and atraumatic.  Eyes: Conjunctivae and EOM are normal.  Neck: Normal range of motion.  Cardiovascular: Normal rate, regular rhythm and normal heart sounds.  Pulmonary/Chest: Effort normal and breath sounds normal.  Musculoskeletal: Normal range of motion.  Neurological: He is alert and oriented to person, place, and time.  Skin: No erythema.  Psychiatric: He has a normal mood and affect. His behavior is normal. Judgment and thought content normal.    Results for orders placed or performed in visit on 06/18/17  Microscopic Examination  Result Value Ref Range   WBC, UA 0-5 0 - 5  /hpf   RBC, UA 0-2 0 - 2 /hpf   Epithelial Cells (non renal) 0-10 0 - 10 /hpf   Bacteria, UA None seen None seen/Few  CBC with Differential/Platelet  Result Value Ref Range   WBC 5.5 3.4 - 10.8 x10E3/uL   RBC 5.02 4.14 - 5.80 x10E6/uL   Hemoglobin 11.0 (L) 13.0 - 17.7 g/dL   Hematocrit 37.2 (L) 37.5 - 51.0 %   MCV 74 (L) 79 - 97 fL   MCH 21.9 (L) 26.6 - 33.0 pg   MCHC 29.6 (L) 31.5 - 35.7 g/dL   RDW 16.9 (H) 12.3 - 15.4 %   Platelets 218 150 - 379 x10E3/uL   Neutrophils 71 Not Estab. %   Lymphs 19 Not Estab. %   Monocytes 9 Not Estab. %   Eos 1 Not Estab. %   Basos 0 Not Estab. %   Neutrophils Absolute 3.9 1.4 - 7.0 x10E3/uL   Lymphocytes Absolute 1.0 0.7 - 3.1 x10E3/uL   Monocytes Absolute 0.5 0.1 - 0.9 x10E3/uL   EOS (ABSOLUTE) 0.1 0.0 - 0.4 x10E3/uL   Basophils Absolute 0.0 0.0 - 0.2 x10E3/uL   Immature Granulocytes 0 Not Estab. %   Immature Grans (Abs) 0.0 0.0 - 0.1 x10E3/uL  Comprehensive metabolic panel  Result Value Ref Range   Glucose 170 (H) 65 - 99 mg/dL   BUN 11 8 - 27 mg/dL   Creatinine, Ser  0.80 0.76 - 1.27 mg/dL   GFR calc non Af Amer 92 >59 mL/min/1.73   GFR calc Af Amer 106 >59 mL/min/1.73   BUN/Creatinine Ratio 14 10 - 24   Sodium 142 134 - 144 mmol/L   Potassium 4.3 3.5 - 5.2 mmol/L   Chloride 102 96 - 106 mmol/L   CO2 23 20 - 29 mmol/L   Calcium 8.8 8.6 - 10.2 mg/dL   Total Protein 7.2 6.0 - 8.5 g/dL   Albumin 4.2 3.6 - 4.8 g/dL   Globulin, Total 3.0 1.5 - 4.5 g/dL   Albumin/Globulin Ratio 1.4 1.2 - 2.2   Bilirubin Total <0.2 0.0 - 1.2 mg/dL   Alkaline Phosphatase 105 39 - 117 IU/L   AST 20 0 - 40 IU/L   ALT 15 0 - 44 IU/L  Lipid panel  Result Value Ref Range   Cholesterol, Total 144 100 - 199 mg/dL   Triglycerides 108 0 - 149 mg/dL   HDL 45 >39 mg/dL   VLDL Cholesterol Cal 22 5 - 40 mg/dL   LDL Calculated 77 0 - 99 mg/dL   Chol/HDL Ratio 3.2 0.0 - 5.0 ratio  PSA  Result Value Ref Range   Prostate Specific Ag, Serum 1.0 0.0 - 4.0 ng/mL    TSH  Result Value Ref Range   TSH 0.926 0.450 - 4.500 uIU/mL  Urinalysis, Routine w reflex microscopic  Result Value Ref Range   Specific Gravity, UA 1.015 1.005 - 1.030   pH, UA 6.0 5.0 - 7.5   Color, UA Yellow Yellow   Appearance Ur Clear Clear   Leukocytes, UA Negative Negative   Protein, UA Negative Negative/Trace   Glucose, UA Negative Negative   Ketones, UA Negative Negative   RBC, UA Trace (A) Negative   Bilirubin, UA Negative Negative   Urobilinogen, Ur 0.2 0.2 - 1.0 mg/dL   Nitrite, UA Negative Negative   Microscopic Examination See below:   Bayer DCA Hb A1c Waived  Result Value Ref Range   HB A1C (BAYER DCA - WAIVED) 5.7 <7.0 %      Assessment & Plan:   Problem List Items Addressed This Visit      Cardiovascular and Mediastinum   Hypertension - Primary    The current medical regimen is effective;  continue present plan and medications.       Relevant Orders   Basic metabolic panel   Bayer DCA Hb A1c Waived   LP+ALT+AST Piccolo, Waived     Digestive   Malignant neoplasm of intrahepatic bile ducts (HCC)    Followed by [facility]        Endocrine   Diabetes mellitus without complication ([place])    The current medical regimen is effective;  continue present plan and medications.       Relevant Orders   Basic metabolic panel   Bayer DCA Hb A1c Waived   LP+ALT+AST Piccolo, Waived     Other   Hyperlipidemia    The current medical regimen is effective;  continue present plan and medications.       Relevant Orders   Basic metabolic panel   Bayer DCA Hb A1c Waived   LP+ALT+AST Piccolo, [place]    Other Visit Diagnoses    Anemia, unspecified type           Follow up plan: Return in about 6 months (around 06/25/2018) for Physical Exam, Hemoglobin A1c.

## 2017-12-23 NOTE — Patient Instructions (Addendum)
Randy Harper , Thank you for taking time to come for your Medicare Wellness Visit. I appreciate your ongoing commitment to your health goals. Please review the following plan we discussed and let me know if I can assist you in the future.   Screening recommendations/referrals: Colonoscopy: completed 05/24/2009 Recommended yearly ophthalmology/optometry visit for glaucoma screening and checkup Recommended yearly dental visit for hygiene and checkup  Vaccinations: Influenza vaccine: due 02/2018 Pneumococcal vaccine: completed series Tdap vaccine: up to date  Shingles vaccine: up to date, 1st dose completed 12/09/2017  Advanced directives: Please bring a copy of your health care power of attorney and living will to the office at your convenience.  Conditions/risks identified: Recommend drinking at least 6-8 glasses of water a day   Next appointment: Follow up in one year for your annual wellness exam.   Preventive Care 65 Years and Older, Male Preventive care refers to lifestyle choices and visits with your health care provider that can promote health and wellness. What does preventive care include?  A yearly physical exam. This is also called an annual well check.  Dental exams once or twice a year.  Routine eye exams. Ask your health care provider how often you should have your eyes checked.  Personal lifestyle choices, including:  Daily care of your teeth and gums.  Regular physical activity.  Eating a healthy diet.  Avoiding tobacco and drug use.  Limiting alcohol use.  Practicing safe sex.  Taking low doses of aspirin every day.  Taking vitamin and mineral supplements as recommended by your health care provider. What happens during an annual well check? The services and screenings done by your health care provider during your annual well check will depend on your age, overall health, lifestyle risk factors, and family history of disease. Counseling  Your health care  provider may ask you questions about your:  Alcohol use.  Tobacco use.  Drug use.  Emotional well-being.  Home and relationship well-being.  Sexual activity.  Eating habits.  History of falls.  Memory and ability to understand (cognition).  Work and work Statistician. Screening  You may have the following tests or measurements:  Height, weight, and BMI.  Blood pressure.  Lipid and cholesterol levels. These may be checked every 5 years, or more frequently if you are over 35 years [place].  Skin check.  Lung cancer screening. You may have this screening every year starting at age 51 if you have a 30-pack-year history of smoking and currently smoke or have quit within the past 15 years.  Fecal occult blood test (FOBT) of the stool. You may have this test every year starting at age 34.  Flexible sigmoidoscopy or colonoscopy. You may have a sigmoidoscopy every 5 years or a colonoscopy every 10 years starting at age 23.  Prostate cancer screening. Recommendations will vary depending on your family history and other risks.  Hepatitis C blood test.  Hepatitis B blood test.  Sexually transmitted disease (STD) testing.  Diabetes screening. This is done by checking your blood sugar (glucose) after you have not eaten for a while (fasting). You may have this done every 1-3 years.  Abdominal aortic aneurysm (AAA) screening. You may need this if you are a current or former smoker.  Osteoporosis. You may be screened starting at age 21 if you are at high risk. Talk with your health care provider about your test results, treatment options, and if necessary, the need for more tests. Vaccines  Your health care provider may  recommend certain vaccines, such as:  Influenza vaccine. This is recommended every year.  Tetanus, diphtheria, and acellular pertussis (Tdap, Td) vaccine. You may need a Td booster every 10 years.  Zoster vaccine. You may need this after age 33.  Pneumococcal  13-valent conjugate (PCV13) vaccine. One dose is recommended after age 33.  Pneumococcal polysaccharide (PPSV23) vaccine. One dose is recommended after age 72. Talk to your health care provider about which screenings and vaccines you need and how often you need them. This information is not intended to replace advice given to you by your health care provider. Make sure you discuss any questions you have with your health care provider. Document Released: 06/24/2015 Document Revised: 02/15/2016 Document Reviewed: 03/29/2015 Elsevier Interactive Patient Education  2017 [place] Prevention in the Home Falls can cause injuries. They can happen to people of all ages. There are many things you can do to make your home safe and to help prevent falls. What can I do on the outside of my home?  Regularly fix the edges of walkways and driveways and fix any cracks.  Remove anything that might make you trip as you walk through a door, such as a raised step or threshold.  Trim any bushes or trees on the path to your home.  Use bright outdoor lighting.  Clear any walking paths of anything that might make someone trip, such as rocks or tools.  Regularly check to see if handrails are loose or broken. Make sure that both sides of any steps have handrails.  Any raised decks and porches should have guardrails on the edges.  Have any leaves, snow, or ice cleared regularly.  Use sand or salt on walking paths during winter.  Clean up any spills in your garage right away. This includes oil or grease spills. What can I do in the bathroom?  Use night lights.  Install grab bars by the toilet and in the tub and shower. Do not use towel bars as grab bars.  Use non-skid mats or decals in the tub or shower.  If you need to sit down in the shower, use a plastic, non-slip stool.  Keep the floor dry. Clean up any water that spills on the floor as soon as it happens.  Remove soap buildup in the  tub or shower regularly.  Attach bath mats securely with double-sided non-slip rug tape.  Do not have throw rugs and other things on the floor that can make you trip. What can I do in the bedroom?  Use night lights.  Make sure that you have a light by your bed that is easy to reach.  Do not use any sheets or blankets that are too big for your bed. They should not hang down onto the floor.  Have a firm chair that has side arms. You can use this for support while you get dressed.  Do not have throw rugs and other things on the floor that can make you trip. What can I do in the kitchen?  Clean up any spills right away.  Avoid walking on wet floors.  Keep items that you use a lot in easy-to-reach places.  If you need to reach something above you, use a strong step stool that has a grab bar.  Keep electrical cords out of the way.  Do not use floor polish or wax that makes floors slippery. If you must use wax, use non-skid floor wax.  Do not have throw rugs and  other things on the floor that can make you trip. What can I do with my stairs?  Do not leave any items on the stairs.  Make sure that there are handrails on both sides of the stairs and use them. Fix handrails that are broken or loose. Make sure that handrails are as long as the stairways.  Check any carpeting to make sure that it is firmly attached to the stairs. Fix any carpet that is loose or worn.  Avoid having throw rugs at the top or bottom of the stairs. If you do have throw rugs, attach them to the floor with carpet tape.  Make sure that you have a light switch at the top of the stairs and the bottom of the stairs. If you do not have them, ask someone to add them for you. What else can I do to help prevent falls?  Wear shoes that:  Do not have high heels.  Have rubber bottoms.  Are comfortable and fit you well.  Are closed at the toe. Do not wear sandals.  If you use a stepladder:  Make sure that it  is fully opened. Do not climb a closed stepladder.  Make sure that both sides of the stepladder are locked into place.  Ask someone to hold it for you, if possible.  Clearly mark and make sure that you can see:  Any grab bars or handrails.  First and last steps.  Where the edge of each step is.  Use tools that help you move around (mobility aids) if they are needed. These include:  Canes.  Walkers.  Scooters.  Crutches.  Turn on the lights when you go into a dark area. Replace any light bulbs as soon as they burn out.  Set up your furniture so you have a clear path. Avoid moving your furniture around.  If any of your floors are uneven, fix them.  If there are any pets around you, be aware of where they are.  Review your medicines with your doctor. Some medicines can make you feel dizzy. This can increase your chance of falling. Ask your doctor what other things that you can do to help prevent falls. This information is not intended to replace advice given to you by your health care provider. Make sure you discuss any questions you have with your health care provider. Document Released: 03/24/2009 Document Revised: 11/03/2015 Document Reviewed: 07/02/2014 Elsevier Interactive Patient Education  2017 [employer].

## 2017-12-24 ENCOUNTER — Encounter: Payer: Self-pay | Admitting: Family Medicine

## 2017-12-24 LAB — BASIC METABOLIC PANEL
BUN/Creatinine Ratio: 14 (ref 10–24)
BUN: 11 mg/dL (ref 8–27)
CALCIUM: 8.9 mg/dL (ref 8.6–10.2)
CHLORIDE: 101 mmol/L (ref 96–106)
CO2: 23 mmol/L (ref 20–29)
Creatinine, Ser: 0.8 mg/dL (ref 0.76–1.27)
GFR, EST AFRICAN AMERICAN: 105 mL/min/{1.73_m2} (ref >59)
GFR, EST NON AFRICAN AMERICAN: 91 mL/min/{1.73_m2} (ref >59)
Glucose: 169 mg/dL — ABNORMAL HIGH (ref 65–99)
POTASSIUM: 4.1 mmol/L (ref 3.5–5.2)
Sodium: 139 mmol/L (ref 134–144)

## 2018-01-21 DIAGNOSIS — C7A8 Other malignant neuroendocrine tumors: Secondary | ICD-10-CM | POA: Diagnosis not present

## 2018-01-21 DIAGNOSIS — C7B8 Other secondary neuroendocrine tumors: Secondary | ICD-10-CM | POA: Diagnosis not present

## 2018-01-21 DIAGNOSIS — C801 Malignant (primary) neoplasm, unspecified: Secondary | ICD-10-CM | POA: Diagnosis not present

## 2018-01-21 DIAGNOSIS — C787 Secondary malignant neoplasm of liver and intrahepatic bile duct: Secondary | ICD-10-CM | POA: Diagnosis not present

## 2018-01-29 ENCOUNTER — Other Ambulatory Visit: Payer: Self-pay | Admitting: Family Medicine

## 2018-01-29 DIAGNOSIS — E119 Type 2 diabetes mellitus without complications: Secondary | ICD-10-CM

## 2018-03-18 DIAGNOSIS — H2513 Age-related nuclear cataract, bilateral: Secondary | ICD-10-CM | POA: Diagnosis not present

## 2018-03-18 DIAGNOSIS — E119 Type 2 diabetes mellitus without complications: Secondary | ICD-10-CM | POA: Diagnosis not present

## 2018-03-18 DIAGNOSIS — H401131 Primary open-angle glaucoma, bilateral, mild stage: Secondary | ICD-10-CM | POA: Diagnosis not present

## 2018-03-18 DIAGNOSIS — H359 Unspecified retinal disorder: Secondary | ICD-10-CM | POA: Diagnosis not present

## 2018-03-18 DIAGNOSIS — H35712 Central serous chorioretinopathy, left eye: Secondary | ICD-10-CM | POA: Diagnosis not present

## 2018-03-18 LAB — HM DIABETES EYE EXAM

## 2018-04-29 DIAGNOSIS — Z08 Encounter for follow-up examination after completed treatment for malignant neoplasm: Secondary | ICD-10-CM | POA: Diagnosis not present

## 2018-04-29 DIAGNOSIS — Z8589 Personal history of malignant neoplasm of other organs and systems: Secondary | ICD-10-CM | POA: Diagnosis not present

## 2018-04-29 DIAGNOSIS — C7A8 Other malignant neuroendocrine tumors: Secondary | ICD-10-CM | POA: Diagnosis not present

## 2018-04-29 DIAGNOSIS — C7B8 Other secondary neuroendocrine tumors: Secondary | ICD-10-CM | POA: Diagnosis not present

## 2018-06-30 ENCOUNTER — Ambulatory Visit (INDEPENDENT_AMBULATORY_CARE_PROVIDER_SITE_OTHER): Payer: Medicare Other | Admitting: Family Medicine

## 2018-06-30 ENCOUNTER — Encounter: Payer: Self-pay | Admitting: Family Medicine

## 2018-06-30 VITALS — BP 132/78 | HR 84 | Ht 68.9 in | Wt 220.0 lb

## 2018-06-30 DIAGNOSIS — I10 Essential (primary) hypertension: Secondary | ICD-10-CM | POA: Diagnosis not present

## 2018-06-30 DIAGNOSIS — N400 Enlarged prostate without lower urinary tract symptoms: Secondary | ICD-10-CM

## 2018-06-30 DIAGNOSIS — Z1329 Encounter for screening for other suspected endocrine disorder: Secondary | ICD-10-CM

## 2018-06-30 DIAGNOSIS — E782 Mixed hyperlipidemia: Secondary | ICD-10-CM | POA: Diagnosis not present

## 2018-06-30 DIAGNOSIS — E119 Type 2 diabetes mellitus without complications: Secondary | ICD-10-CM

## 2018-06-30 DIAGNOSIS — Z0000 Encounter for general adult medical examination without abnormal findings: Secondary | ICD-10-CM

## 2018-06-30 DIAGNOSIS — C7A8 Other malignant neuroendocrine tumors: Secondary | ICD-10-CM

## 2018-06-30 DIAGNOSIS — Z23 Encounter for immunization: Secondary | ICD-10-CM | POA: Diagnosis not present

## 2018-06-30 DIAGNOSIS — C7B8 Other secondary neuroendocrine tumors: Secondary | ICD-10-CM

## 2018-06-30 DIAGNOSIS — Z7189 Other specified counseling: Secondary | ICD-10-CM

## 2018-06-30 LAB — URINALYSIS, ROUTINE W REFLEX MICROSCOPIC
Bilirubin, UA: NEGATIVE
Glucose, UA: NEGATIVE
Ketones, UA: NEGATIVE
Leukocytes, UA: NEGATIVE
Nitrite, UA: NEGATIVE
PH UA: 6.5 (ref 5.0–7.5)
Protein, UA: NEGATIVE
RBC, UA: NEGATIVE
Specific Gravity, UA: 1.015 (ref 1.005–1.030)
Urobilinogen, Ur: 1 mg/dL (ref 0.2–1.0)

## 2018-06-30 LAB — MICROALBUMIN, URINE WAIVED
CREATININE, URINE WAIVED: 50 mg/dL (ref 10–300)
MICROALB, UR WAIVED: 10 mg/L (ref 0–19)

## 2018-06-30 LAB — BAYER DCA HB A1C WAIVED: HB A1C (BAYER DCA - WAIVED): 5.5 % (ref <7.0)

## 2018-06-30 MED ORDER — METFORMIN HCL 500 MG PO TABS: 500.0000 mg | ORAL_TABLET | Freq: Every day | ORAL | 4 refills | Status: DC

## 2018-06-30 MED ORDER — DILTIAZEM HCL ER COATED BEADS 240 MG PO CP24: 240.0000 mg | ORAL_CAPSULE | Freq: Every day | ORAL | 4 refills | Status: DC

## 2018-06-30 MED ORDER — ATORVASTATIN CALCIUM 20 MG PO TABS: 20.0000 mg | ORAL_TABLET | Freq: Every day | ORAL | 4 refills | Status: DC

## 2018-06-30 NOTE — Assessment & Plan Note (Signed)
A voluntary discussion about advanced care planning including explanation and discussion of advanced directives was extentively discussed with the patient.  Explained about the healthcare proxy and living will was reviewed and packet with forms with expiration of how to fill them out was given.  Time spent: Encounter 16+ min individuals present: Patient 

## 2018-06-30 NOTE — Assessment & Plan Note (Signed)
No chemo radiation, just 90 day checks

## 2018-06-30 NOTE — Assessment & Plan Note (Signed)
Stable

## 2018-06-30 NOTE — Assessment & Plan Note (Signed)
The current medical regimen is effective;  continue present plan and medications.  

## 2018-06-30 NOTE — Patient Instructions (Signed)
Td Vaccine (Tetanus and Diphtheria): What You Need to Know 1. Why get vaccinated? Tetanus  and diphtheria are very serious diseases. They are rare in the [place] today, but people who do become infected often have severe complications. Td vaccine is used to protect adolescents and adults from both of these diseases. Both tetanus and diphtheria are infections caused by bacteria. Diphtheria spreads from person to person through coughing or sneezing. Tetanus-causing bacteria enter the body through cuts, scratches, or wounds. TETANUS (Lockjaw) causes painful muscle tightening and stiffness, usually all over the body.  It can lead to tightening of muscles in the head and neck so you can't open your mouth, swallow, or sometimes even breathe. Tetanus kills about 1 out of every 10 people who are infected even after receiving the best medical care. DIPHTHERIA can cause a thick coating to form in the back of the throat.  It can lead to breathing problems, paralysis, heart failure, and death. Before vaccines, as many as 200,000 cases of diphtheria and hundreds of cases of tetanus were reported in the [place] each year. Since vaccination began, reports of cases for both diseases have dropped by about 99%. 2. Td vaccine Td vaccine can protect adolescents and adults from tetanus and diphtheria. Td is usually given as a booster dose every 10 years but it can also be given earlier after a severe and dirty wound or burn. Another vaccine, called Tdap, which protects against pertussis in addition to tetanus and diphtheria, is sometimes recommended instead of Td vaccine. Your doctor or the person giving you the vaccine can give you more information. Td may safely be given at the same time as other vaccines. 3. Some people should not get this vaccine  A person who has ever had a life-threatening allergic reaction after a previous dose of any tetanus or diphtheria containing vaccine, OR has a severe allergy  to any part of this vaccine, should not get Td vaccine. Tell the person giving the vaccine about any severe allergies.  Talk to your doctor if you: ? had severe pain or swelling after any vaccine containing diphtheria or tetanus, ? ever had a condition called Guillain Barr Syndrome (GBS), ? aren't feeling well on the day the shot is scheduled. 4. Risks of a vaccine reaction With any medicine, including vaccines, there is a chance of side effects. These are usually mild and go away on their own. Serious reactions are also possible but are rare. Most people who get Td vaccine do not have any problems with it. Mild Problems following Td vaccine: (Did not interfere with activities)  Pain where the shot was given (about 8 people in 10)  Redness or swelling where the shot was given (about 1 person in 4)  Mild fever (rare)  Headache (about 1 person in 4)  Tiredness (about 1 person in 4) Moderate Problems following Td vaccine: (Interfered with activities, but did not require medical attention)  Fever over 102F (rare) Severe Problems following Td vaccine: (Unable to perform usual activities; required medical attention)  Swelling, severe pain, bleeding and/or redness in the arm where the shot was given (rare). Problems that could happen after any vaccine:  People sometimes faint after a medical procedure, including vaccination. Sitting or lying down for about 15 minutes can help prevent fainting, and injuries caused by a fall. Tell your doctor if you feel dizzy, or have vision changes or ringing in the ears.  Some people get severe pain in the shoulder and have   difficulty moving the arm where a shot was given. This happens very rarely.  Any medication can cause a severe allergic reaction. Such reactions from a vaccine are very rare, estimated at fewer than 1 in a million doses, and would happen within a few minutes to a few hours after the vaccination. As with any medicine, there is a  very remote chance of a vaccine causing a serious injury or death. The safety of vaccines is always being monitored. For more information, visit: www.cdc.gov/vaccinesafety/ 5. What if there is a serious reaction? What should I look for?  Look for anything that concerns you, such as signs of a severe allergic reaction, very high fever, or unusual behavior. Signs of a severe allergic reaction can include hives, swelling of the face and throat, difficulty breathing, a fast heartbeat, dizziness, and weakness. These would usually start a few minutes to a few hours after the vaccination. What should I do?  If you think it is a severe allergic reaction or other emergency that can't wait, call 9-1-1 or get the person to the nearest hospital. Otherwise, call your doctor.  Afterward, the reaction should be reported to the Vaccine Adverse Event Reporting System (VAERS). Your doctor might file this report, or you can do it yourself through the VAERS web site at www.vaers.hhs.gov, or by calling 1-800-822-7967. VAERS does not give medical advice. 6. The National Vaccine Injury Compensation Program The National Vaccine Injury Compensation Program (VICP) is a federal program that was created to compensate people who may have been injured by certain vaccines. Persons who believe they may have been injured by a vaccine can learn about the program and about filing a claim by calling 1-800-338-2382 or visiting the VICP website at www.hrsa.gov/vaccinecompensation. There is a time limit to file a claim for compensation. 7. How can I learn more?  Ask your doctor. He or she can give you the vaccine package insert or suggest other sources of information.  Call your local or state health department.  Contact the Centers for Disease Control and Prevention (CDC): ? Call 1-800-232-4636 (1-800-CDC-INFO) ? Visit CDC's website at www.cdc.gov/vaccines Vaccine Information Statement Td Vaccine (09/20/15) This information is  not intended to replace advice given to you by your health care provider. Make sure you discuss any questions you have with your health care provider. Document Released: 03/25/2006 Document Revised: 01/13/2018 Document Reviewed: 01/13/2018 Elsevier Interactive Patient Education  2019 [employer]  

## 2018-06-30 NOTE — Progress Notes (Signed)
BP 132/78 (BP Location: Left Arm)   Pulse 84   Ht 5' 8.9" (1.75 m)   Wt 220 lb (99.8 kg)   SpO2 98%   BMI 32.58 kg/m    Subjective:    Patient ID: Randy Harper, male    DOB: [birth year], 68 y.o.   MRN: [id number]  HPI: Randy Harper is a 68 y.o. male  Chief Complaint  Patient presents with  . Annual Exam  . Health Maintenance    Td needed - given, A1C & Microalbumin ordered  Patient follow-up doing well no complaints taking metformin for diabetes and no low blood sugar spells or issues. Blood pressure good control with diltiazem no problems Taking atorvastatin for cholesterol with good control or issues. Does have what sounds like a ventral hernia are either incisional hernia with liver surgery which is non-bothersome.  Relevant past medical, surgical, family and social history reviewed and updated as indicated. Interim medical history since our last visit reviewed. Allergies and medications reviewed and updated.  Review of Systems  Constitutional: Negative.   HENT: Negative.   Eyes: Negative.   Respiratory: Negative.   Cardiovascular: Negative.   Gastrointestinal: Negative.   Endocrine: Negative.   Genitourinary: Negative.   Musculoskeletal: Negative.   Skin: Negative.   Allergic/Immunologic: Negative.   Neurological: Negative.   Hematological: Negative.   Psychiatric/Behavioral: Negative.     Per HPI unless specifically indicated above     Objective:    BP 132/78 (BP Location: Left Arm)   Pulse 84   Ht 5' 8.9" (1.75 m)   Wt 220 lb (99.8 kg)   SpO2 98%   BMI 32.58 kg/m   Wt Readings from Last 3 Encounters:  06/30/18 220 lb (99.8 kg)  12/23/17 217 lb (98.4 kg)  12/23/17 217 lb 14.4 oz (98.8 kg)    Physical Exam Constitutional:      Appearance: He is well-developed.  HENT:     Head: Normocephalic and atraumatic.     Right Ear: External ear normal.     Left Ear: External ear normal.  Eyes:     Conjunctiva/sclera: Conjunctivae normal.   Pupils: Pupils are equal, round, and reactive to light.  Neck:     Musculoskeletal: Normal range of motion and neck supple.  Cardiovascular:     Rate and Rhythm: Normal rate and regular rhythm.     Heart sounds: Normal heart sounds.  Pulmonary:     Effort: Pulmonary effort is normal.     Breath sounds: Normal breath sounds.  Abdominal:     General: Bowel sounds are normal.     Palpations: Abdomen is soft. There is no hepatomegaly or splenomegaly.  Genitourinary:    Penis: Normal.      Rectum: Normal.     Comments: Mild BPH changes Musculoskeletal: Normal range of motion.  Skin:    Findings: No erythema or rash.     Comments: Scarring of abdomen from surgery with ventral hernia  Neurological:     Mental Status: He is alert and oriented to person, place, and time.     Deep Tendon Reflexes: Reflexes are normal and symmetric.  Psychiatric:        Behavior: Behavior normal.        Thought Content: Thought content normal.        Judgment: Judgment normal.     Results for orders placed or performed in visit on 03/19/18  HM DIABETES EYE EXAM  Result Value Ref Range   HM  Diabetic Eye Exam No Retinopathy No Retinopathy      Assessment & Plan:   Problem List Items Addressed This Visit      Cardiovascular and Mediastinum   Hypertension - Primary   Relevant Medications   diltiazem (CARDIZEM CD) 240 MG 24 hr capsule   atorvastatin (LIPITOR) 20 MG tablet   Other Relevant Orders   Bayer DCA Hb A1c Waived   CBC with Differential/Platelet   Comprehensive metabolic panel   Lipid panel   Urinalysis, Routine w reflex microscopic   Microalbumin, Urine Waived   Benign essential HTN    The current medical regimen is effective;  continue present plan and medications.       Relevant Medications   diltiazem (CARDIZEM CD) 240 MG 24 hr capsule   atorvastatin (LIPITOR) 20 MG tablet     Digestive   Neuroendocrine carcinoma metastatic to liver (HCC)    No chemo radiation, just 90 day  checks        Endocrine   Diabetes ([place])    The current medical regimen is effective;  continue present plan and medications.       Relevant Medications   metFORMIN (GLUCOPHAGE) 500 MG tablet   atorvastatin (LIPITOR) 20 MG tablet     Genitourinary   BPH (benign prostatic hyperplasia)    Stable       Relevant Orders   PSA     Other   Hyperlipidemia    The current medical regimen is effective;  continue present plan and medications.       Relevant Medications   diltiazem (CARDIZEM CD) 240 MG 24 hr capsule   atorvastatin (LIPITOR) 20 MG tablet   Other Relevant Orders   Bayer DCA Hb A1c Waived   CBC with Differential/Platelet   Comprehensive metabolic panel   Lipid panel   Urinalysis, Routine w reflex microscopic   Microalbumin, Urine Waived   Advanced care planning/counseling discussion    A voluntary discussion about advanced care planning including explanation and discussion of advanced directives was extentively discussed with the patient.  Explained about the healthcare proxy and living will was reviewed and packet with forms with expiration of how to fill them out was given.  Time spent: Encounter 16+ min individuals present: Patient       Other Visit Diagnoses    Diabetes mellitus without complication ([place])       Relevant Medications   metFORMIN (GLUCOPHAGE) 500 MG tablet   atorvastatin (LIPITOR) 20 MG tablet   Other Relevant Orders   Bayer DCA Hb A1c Waived   CBC with Differential/Platelet   Comprehensive metabolic panel   Lipid panel   Urinalysis, Routine w reflex microscopic   Microalbumin, Urine Waived   Thyroid disorder screen       Relevant Orders   TSH   PE (physical exam), annual       Need for Tdap vaccination       Relevant Orders   Td : Tetanus/diphtheria >7yo Preservative  free (Completed)       Follow up plan: Return in about 6 months (around 12/29/2018) for BMP,  Lipids, ALT, AST.

## 2018-07-03 LAB — LIPID PANEL
Chol/HDL Ratio: 3.2 ratio (ref 0.0–5.0)
Cholesterol, Total: 133 mg/dL (ref 100–199)
HDL: 42 mg/dL (ref >39)
LDL CALC: 68 mg/dL (ref 0–99)
Triglycerides: 117 mg/dL (ref 0–149)
VLDL CHOLESTEROL CAL: 23 mg/dL (ref 5–40)

## 2018-07-03 LAB — TSH: TSH: 0.969 u[IU]/mL (ref 0.450–4.500)

## 2018-07-03 LAB — CBC WITH DIFFERENTIAL/PLATELET
BASOS ABS: 0 10*3/uL (ref 0.0–0.2)
Basos: 1 %
EOS (ABSOLUTE): 0.1 10*3/uL (ref 0.0–0.4)
Eos: 2 %
Hematocrit: 42.4 % (ref 37.5–51.0)
Hemoglobin: 14.6 g/dL (ref 13.0–17.7)
Immature Grans (Abs): 0 10*3/uL (ref 0.0–0.1)
Immature Granulocytes: 0 %
LYMPHS ABS: 1 10*3/uL (ref 0.7–3.1)
Lymphs: 25 %
MCH: 32 pg (ref 26.6–33.0)
MCHC: 34.4 g/dL (ref 31.5–35.7)
MCV: 93 fL (ref 79–97)
MONOS ABS: 0.4 10*3/uL (ref 0.1–0.9)
Monocytes: 10 %
Neutrophils Absolute: 2.4 10*3/uL (ref 1.4–7.0)
Neutrophils: 62 %
PLATELETS: 185 10*3/uL (ref 150–450)
RBC: 4.56 x10E6/uL (ref 4.14–5.80)
RDW: 11.8 % (ref 11.6–15.4)
WBC: 3.8 10*3/uL (ref 3.4–10.8)

## 2018-07-03 LAB — COMPREHENSIVE METABOLIC PANEL
ALK PHOS: 82 IU/L (ref 39–117)
ALT: 23 IU/L (ref 0–44)
AST: 17 IU/L (ref 0–40)
Albumin/Globulin Ratio: 2.2 (ref 1.2–2.2)
Albumin: 4.4 g/dL (ref 3.8–4.8)
BUN / CREAT RATIO: 13 (ref 10–24)
BUN: 12 mg/dL (ref 8–27)
Bilirubin Total: 0.4 mg/dL (ref 0.0–1.2)
CHLORIDE: 100 mmol/L (ref 96–106)
CO2: 22 mmol/L (ref 20–29)
CREATININE: 0.9 mg/dL (ref 0.76–1.27)
Calcium: 9 mg/dL (ref 8.6–10.2)
GFR calc non Af Amer: 87 mL/min/{1.73_m2} (ref >59)
GFR, EST AFRICAN AMERICAN: 100 mL/min/{1.73_m2} (ref >59)
GLUCOSE: 156 mg/dL — AB (ref 65–99)
Globulin, Total: 2 g/dL (ref 1.5–4.5)
Potassium: 4 mmol/L (ref 3.5–5.2)
Sodium: 138 mmol/L (ref 134–144)
Total Protein: 6.4 g/dL (ref 6.0–8.5)

## 2018-07-03 LAB — PSA: Prostate Specific Ag, Serum: 0.7 ng/mL (ref 0.0–4.0)

## 2018-07-06 ENCOUNTER — Encounter: Payer: Self-pay | Admitting: Family Medicine

## 2018-07-22 DIAGNOSIS — C7A8 Other malignant neuroendocrine tumors: Secondary | ICD-10-CM | POA: Diagnosis not present

## 2018-07-22 DIAGNOSIS — C7B8 Other secondary neuroendocrine tumors: Secondary | ICD-10-CM | POA: Diagnosis not present

## 2018-08-06 DIAGNOSIS — I10 Essential (primary) hypertension: Secondary | ICD-10-CM | POA: Diagnosis not present

## 2018-08-06 DIAGNOSIS — E119 Type 2 diabetes mellitus without complications: Secondary | ICD-10-CM | POA: Diagnosis not present

## 2018-08-06 DIAGNOSIS — E782 Mixed hyperlipidemia: Secondary | ICD-10-CM | POA: Diagnosis not present

## 2018-08-06 DIAGNOSIS — I4892 Unspecified atrial flutter: Secondary | ICD-10-CM | POA: Diagnosis not present

## 2018-10-21 DIAGNOSIS — Z9049 Acquired absence of other specified parts of digestive tract: Secondary | ICD-10-CM | POA: Diagnosis not present

## 2018-10-21 DIAGNOSIS — C7A8 Other malignant neuroendocrine tumors: Secondary | ICD-10-CM | POA: Diagnosis not present

## 2018-10-21 DIAGNOSIS — Z8505 Personal history of malignant neoplasm of liver: Secondary | ICD-10-CM | POA: Diagnosis not present

## 2018-10-21 DIAGNOSIS — C7B8 Other secondary neuroendocrine tumors: Secondary | ICD-10-CM | POA: Diagnosis not present

## 2018-10-21 DIAGNOSIS — Z08 Encounter for follow-up examination after completed treatment for malignant neoplasm: Secondary | ICD-10-CM | POA: Diagnosis not present

## 2018-11-20 ENCOUNTER — Telehealth: Payer: Self-pay | Admitting: Family Medicine

## 2018-11-20 NOTE — Chronic Care Management (AMB) (Signed)
°  Chronic Care Management   Outreach Note  11/20/2018 Name: Randy Harper MRN: [id number] DOB: 1950/08/16  Referred by: [name], MD Reason for referral : Chronic Care Management (Initial CCM outreach was unsccessful.)   An unsuccessful telephone outreach was attempted today. The patient was referred to the case management team by for assistance with chronic care management and care coordination.   Follow Up Plan: A HIPPA compliant phone message was left for the patient providing contact information and requesting a return call.  The care management team will reach out to the patient again over the next 7 days.  If patient returns call to provider office, please advise to call [place] at [place]  [email or web address]   ??[phone]

## 2018-11-21 NOTE — Chronic Care Management (AMB) (Signed)
Chronic Care Management   Note  11/21/2018 Name: Randy Harper MRN: [id number] DOB: 1950-10-28  Randy Harper is a 68 y.o. year [place] male who is a primary care patient of Crissman, Jeannette How, MD. I reached out to [place] by phone today in response to a referral sent by [name] health plan.    [name] was given information about Chronic Care Management services today including:  1. CCM service includes personalized support from designated clinical staff supervised by his physician, including individualized plan of care and coordination with other care providers 2. 24/7 contact phone numbers for assistance for urgent and routine care needs. 3. Service will only be billed when office clinical staff spend 20 minutes or more in a month to coordinate care. 4. Only one practitioner may furnish and bill the service in a calendar month. 5. The patient may stop CCM services at any time (effective at the end of the month) by phone call to the office staff. 6. The patient will be responsible for cost sharing (co-pay) of up to 20% of the service fee (after annual deductible is met).  Patient agreed to services and verbal consent obtained.   Follow up plan: Telephone appointment with CCM team member scheduled for: 12/09/2018  [place]  [email or web address]   ??[phone]

## 2018-12-09 ENCOUNTER — Ambulatory Visit: Payer: Self-pay | Admitting: Pharmacist

## 2018-12-09 ENCOUNTER — Telehealth: Payer: Medicare Other

## 2018-12-09 NOTE — Chronic Care Management (AMB) (Signed)
  Chronic Care Management   Note  12/09/2018 Name: CHISOM AUST MRN: [id number] DOB: 25-Nov-1950  Georg Ruddle Sima is a 68 y.o. year [place] male who is a primary care patient of Crissman, Jeannette How, MD. The CCM team was consulted for assistance with chronic disease management and care coordination needs.     Received referral for chronic care management services from the patient's insurance plan. Contacted patient as scheduled for appointment; left HIPAA compliant message for patient to return my call at his convenience.   Follow up plan: - If I do not hear back, will outreach again in 2-4 weeks  [name], PharmD Clinical Pharmacist [place] [phone]

## 2018-12-10 ENCOUNTER — Ambulatory Visit (INDEPENDENT_AMBULATORY_CARE_PROVIDER_SITE_OTHER): Payer: Medicare Other | Admitting: Pharmacist

## 2018-12-10 DIAGNOSIS — E119 Type 2 diabetes mellitus without complications: Secondary | ICD-10-CM | POA: Diagnosis not present

## 2018-12-10 DIAGNOSIS — I10 Essential (primary) hypertension: Secondary | ICD-10-CM

## 2018-12-10 NOTE — Chronic Care Management (AMB) (Signed)
Chronic Care Management   Note  12/10/2018 Name: Randy Harper MRN: [id number] DOB: September 19, 1950   Subjective:  Randy Harper is a 68 y.o. year Randy Harper male who is a primary care patient of Crissman, Jeannette How, MD. The CCM team was consulted for assistance with chronic disease management and care coordination needs.    Received referral from patient's insurance plan due to high risk status.   Review of patient status, including review of consultants reports, laboratory and other test data, was performed as part of comprehensive evaluation and provision of chronic care management services.   Objective:  Lab Results  Component Value Date   CREATININE 0.90 06/30/2018   CREATININE 0.80 12/23/2017   CREATININE 0.80 06/18/2017    Lab Results  Component Value Date   HGBA1C 5.5 06/30/2018       Component Value Date/Time   CHOL 133 06/30/2018 0000   CHOL 141 12/23/2017 0951   TRIG 117 06/30/2018 0000   TRIG 151 (H) 12/23/2017 0951   HDL 42 06/30/2018 0000   CHOLHDL 3.2 06/30/2018 0000   VLDL 30 (H) 12/23/2017 0951   LDLCALC 68 06/30/2018 0000    Clinical ASCVD: No  The 10-year ASCVD risk score Randy Harper DC Jr., et al., 2013) is: 35.2%   Values used to calculate the score:     Age: 53 years     Sex: Male     Is Non-Hispanic African American: No     Diabetic: Yes     Tobacco smoker: No     Systolic Blood Pressure: 330 mmHg     Is BP treated: Yes     HDL Cholesterol: 42 mg/dL     Total Cholesterol: 133 mg/dL    BP Readings from Last 3 Encounters:  06/30/18 132/78  12/23/17 118/70  12/23/17 118/70    Medications Reviewed Today    Reviewed by De Hollingshead, [place] (Pharmacist) on 12/10/18 at 27  Med List Status: <None>  Medication Order Taking? Sig Documenting Provider Last Dose Status Informant  aspirin EC 81 MG tablet [id number] Yes Take 81 mg by mouth daily. [provider] Taking Active Pharmacy Records  atorvastatin (LIPITOR) 20 MG tablet [id number] Yes Take 1  tablet (20 mg total) by mouth daily. [name], MD Taking Active   brimonidine (ALPHAGAN) 0.2 % ophthalmic solution [id number] Yes Place 1 drop into the left eye daily.  [provider] Taking Active Pharmacy Records           Med Note Randy Harper, Randy Harper Dec 30, 2016  9:08 AM)    diltiazem (CARDIZEM CD) 240 MG 24 hr capsule [id number] Yes Take 1 capsule (240 mg total) by mouth daily. [name], MD Taking Active   ferrous sulfate 325 (65 FE) MG tablet [id number] Yes Take 325 mg by mouth daily with breakfast. [provider] Taking Active   metFORMIN (GLUCOPHAGE) 500 MG tablet [id number] Yes Take 1 tablet (500 mg total) by mouth daily with breakfast. [name], MD Taking Active   Multiple Vitamin (MULTIVITAMIN) tablet [id number] Yes Take 1 tablet by mouth daily. [provider] Taking Active            Assessment:   Goals Addressed            This Visit's Progress     Patient Stated   . "I want to stay in good health" (pt-stated)       Current Barriers:  . Patient with hx  T2DM, HTN, HLD, and more recent dx of neuroendocrine carcinoma with liver mets. Followed Q3 months by [facility] oncology for lab work and CT scans . Chronic conditions: o T2DM: metformin 500 mg QAM; most recent A1c 5.5% o HTN: diltiazem CD 240 mg QAM; recent office BP 110-130s/70-80s, HR 70-90s o HLD: atorvastatin 20 mg QPM; last LDL 68  o Noted previous iron deficiency; taking iron supplement daily, denies any GI complaints   Pharmacist Clinical Goal(s):  Randy Harper Over the next 90 days, patient will work with PharmD and primary care provider to address needs related to optimized health maintenance  Interventions: . Comprehensive medication review performed. Epic medication list updated . Encouraged continued medication adherence, physical activity. Patient denies any questions or concerns at this time o T2DM well controlled goal <7%, though goal <6.5% appropriate for this  patient; recommend continued metformin 500 mg QAM o HTN well controlled goal <140/90, recommend continued diltiazem CD 240 mg daily w/ continued monitoring  o HLD well controlled goal LDL <70, recommend continued atorvastatin 20 mg daily w/ continued monitoring  Patient Self Care Activities:  . Self administers medications as prescribed . Calls pharmacy for medication refills . Calls provider office for new concerns or questions  Initial goal documentation        Plan: - PharmD will follow up with patient in 6-8 weeks for continued medication management support   Randy Harper, PharmD Clinical Pharmacist [place] [phone]

## 2018-12-10 NOTE — Patient Instructions (Signed)
Visit Information  Goals Addressed            This Visit's Progress     Patient Stated   . "I want to stay in good health" (pt-stated)       Current Barriers:  . Patient with hx T2DM, HTN, HLD, and more recent dx of neuroendocrine carcinoma with liver mets. Followed Q3 months by [facility] oncology for lab work and CT scans . Chronic conditions: o T2DM: metformin 500 mg QAM; most recent A1c 5.5% o HTN: diltiazem CD 240 mg QAM; recent office BP 110-130s/70-80s, HR 70-90s o HLD: atorvastatin 20 mg QPM; last LDL 68  o Noted previous iron deficiency; taking iron supplement daily, denies any GI complaints   Pharmacist Clinical Goal(s):  [name] Over the next 90 days, patient will work with PharmD and primary care provider to address needs related to optimized health maintenance  Interventions: . Comprehensive medication review performed. Epic medication list updated . Encouraged continued medication adherence, physical activity. Patient denies any questions or concerns at this time o T2DM well controlled goal <7%, though goal <6.5% appropriate for this patient; recommend continued metformin 500 mg QAM o HTN well controlled goal <140/90, recommend continued diltiazem CD 240 mg daily w/ continued monitoring  o HLD well controlled goal LDL <70, recommend continued atorvastatin 20 mg daily w/ continued monitoring  Patient Self Care Activities:  . Self administers medications as prescribed . Calls pharmacy for medication refills . Calls provider office for new concerns or questions  Initial goal documentation        The patient verbalized understanding of instructions provided today and declined a print copy of patient instruction materials.    Plan: - PharmD will follow up with patient in 6-8 weeks for continued medication management support   [name], PharmD Clinical Pharmacist [place] [phone]

## 2018-12-17 ENCOUNTER — Telehealth: Payer: Self-pay

## 2018-12-30 ENCOUNTER — Ambulatory Visit: Payer: Medicare Other | Admitting: Family Medicine

## 2019-01-14 ENCOUNTER — Ambulatory Visit: Payer: Medicare Other | Admitting: Family Medicine

## 2019-01-14 ENCOUNTER — Other Ambulatory Visit: Payer: Self-pay

## 2019-01-14 ENCOUNTER — Ambulatory Visit (INDEPENDENT_AMBULATORY_CARE_PROVIDER_SITE_OTHER): Payer: Medicare Other

## 2019-01-14 VITALS — BP 118/72 | HR 62 | Temp 97.5°F | Resp 16 | Ht 69.0 in | Wt 221.7 lb

## 2019-01-14 DIAGNOSIS — Z1211 Encounter for screening for malignant neoplasm of colon: Secondary | ICD-10-CM | POA: Diagnosis not present

## 2019-01-14 DIAGNOSIS — E782 Mixed hyperlipidemia: Secondary | ICD-10-CM

## 2019-01-14 DIAGNOSIS — I10 Essential (primary) hypertension: Secondary | ICD-10-CM | POA: Diagnosis not present

## 2019-01-14 DIAGNOSIS — Z0000 Encounter for general adult medical examination without abnormal findings: Secondary | ICD-10-CM | POA: Diagnosis not present

## 2019-01-14 DIAGNOSIS — E119 Type 2 diabetes mellitus without complications: Secondary | ICD-10-CM | POA: Diagnosis not present

## 2019-01-14 LAB — LP+ALT+AST PICCOLO, WAIVED
ALT (SGPT) Piccolo, Waived: 26 U/L (ref 10–47)
AST (SGOT) Piccolo, Waived: 32 U/L (ref 11–38)
Chol/HDL Ratio Piccolo,Waive: 3.1 mg/dL
Cholesterol Piccolo, Waived: 147 mg/dL (ref <200)
HDL Chol Piccolo, Waived: 47 mg/dL — ABNORMAL LOW (ref >59)
LDL Chol Calc Piccolo Waived: 77 mg/dL (ref <100)
Triglycerides Piccolo,Waived: 113 mg/dL (ref <150)
VLDL Chol Calc Piccolo,Waive: 23 mg/dL (ref <30)

## 2019-01-14 LAB — BAYER DCA HB A1C WAIVED: HB A1C (BAYER DCA - WAIVED): 5.6 % (ref <7.0)

## 2019-01-14 NOTE — Progress Notes (Signed)
Subjective:   Randy Harper is a 68 y.o. male who presents for Medicare Annual/Subsequent preventive examination.    Review of Systems:   Cardiac Risk Factors include: hypertension;male gender;advanced age (>62men, >59 women);dyslipidemia;diabetes mellitus;obesity (BMI >30kg/m2)     Objective:    Vitals: BP 118/72 (BP Location: Right Arm, Patient Position: Sitting, Cuff Size: Normal)   Pulse 62   Temp (!) 97.5 F (36.4 C) (Temporal)   Resp 16   Ht 5\' 9"  (1.753 m)   Wt 221 lb 11.2 oz (100.6 kg)   BMI 32.74 kg/m   Body mass index is 32.74 kg/m.  Advanced Directives 01/14/2019 12/23/2017 12/19/2016  Does Patient Have a Medical Advance Directive? Yes Yes Yes  Type of Advance Directive Living will;Healthcare Power of Attorney Living will;Healthcare Power of [place];Living will  Copy of [place] in Chart? No - copy requested No - copy requested No - copy requested    Tobacco Social History   Tobacco Use  Smoking Status Never Smoker  Smokeless Tobacco Never Used     Counseling given: Not Answered   Clinical Intake:  Pre-visit preparation completed: Yes  Pain : No/denies pain     Nutritional Risks: None Diabetes: Yes CBG done?: No Did pt. bring in CBG monitor from home?: No  How often do you need to have someone help you when you read instructions, pamphlets, or other written materials from your doctor or pharmacy?: 1 - Never  Nutrition Risk Assessment:  Has the patient had any N/V/D within the last 2 months?  No  Does the patient have any non-healing wounds?  No  Has the patient had any unintentional weight loss or weight gain?  No   Diabetes:  Is the patient diabetic?  Yes  If diabetic, was a CBG obtained today?  No  Did the patient bring in their glucometer from home?  No  How often do you monitor your CBG's? Occasionally .   Financial Strains and Diabetes Management:  Are you having any financial  strains with the device, your supplies or your medication? No .  Does the patient want to be seen by Chronic Care Management for management of their diabetes?  Yes  Would the patient like to be referred to a Nutritionist or for Diabetic Management?  Yes    Already in contact with CCM team.   Diabetic Exams:  Diabetic Eye Exam: Completed 03/18/2018. Scheduled to see Dr.Woodard in September.   Diabetic Foot Exam: Completed 12/23/2017.   Interpreter Needed?: No  Information entered by :: Lysette Lindenbaum,LPN  Past Medical History:  Diagnosis Date  . Blood transfusion without reported diagnosis 01/2017  . Cancer ([place]) 01/2017   metastic neuroendocrine - liver  . Diabetes mellitus without complication ([place])   . Glaucoma 2015  . Hyperlipidemia   . Hypertension    Past Surgical History:  Procedure Laterality Date  . CHOLECYSTECTOMY  01/2017   related to liver cancer  . SMALL INTESTINE SURGERY  01/2017   cancer surgery  . TENDON GRAFT Right    right thumb tendon graft at age 43    Family History  Problem Relation Age of Onset  . Heart disease Father   . Diabetes Maternal Uncle   . Diabetes Maternal Uncle   . Diabetes Maternal Uncle   . Diabetes Maternal Uncle    Social History   Socioeconomic History  . Marital status: Married    Spouse name: Not on file  .  Number of children: Not on file  . Years of education: Not on file  . Highest education level: Master's degree (e.g., MA, MS, MEng, MEd, MSW, MBA)  Occupational History  . Not on file  Social Needs  . Financial resource strain: Not hard at all  . Food insecurity    Worry: Never true    Inability: Never true  . Transportation needs    Medical: No    Non-medical: No  Tobacco Use  . Smoking status: Never Smoker  . Smokeless tobacco: Never Used  Substance and Sexual Activity  . Alcohol use: No  . Drug use: No  . Sexual activity: Yes    Birth control/protection: None  Lifestyle  . Physical activity    Days per week:  5 days    Minutes per session: 30 min  . Stress: Not at all  Relationships  . Social connections    Talks on phone: More than three times a week    Gets together: More than three times a week    Attends religious service: More than 4 times per year    Active member of club or organization: No    Attends meetings of clubs or organizations: Never    Relationship status: Married  Other Topics Concern  . Not on file  Social History Narrative  . Not on file    Outpatient Encounter Medications as of 01/14/2019  Medication Sig  . aspirin EC 81 MG tablet Take 81 mg by mouth daily.  [name] atorvastatin (LIPITOR) 20 MG tablet Take 1 tablet (20 mg total) by mouth daily.  . brimonidine (ALPHAGAN) 0.2 % ophthalmic solution Place 1 drop into the left eye daily.   [name] diltiazem (CARDIZEM CD) 240 MG 24 hr capsule Take 1 capsule (240 mg total) by mouth daily.  . ferrous sulfate 325 (65 FE) MG tablet Take 325 mg by mouth daily with breakfast.  . metFORMIN (GLUCOPHAGE) 500 MG tablet Take 1 tablet (500 mg total) by mouth daily with breakfast.  . Multiple Vitamin (MULTIVITAMIN) tablet Take 1 tablet by mouth daily.   No facility-administered encounter medications on file as of 01/14/2019.     Activities of Daily Living In your present state of health, do you have any difficulty performing the following activities: 01/14/2019  Hearing? N  Comment no hearing aids  Vision? N  Comment eyeglasses  Difficulty concentrating or making decisions? N  Walking or climbing stairs? N  Dressing or bathing? N  Doing errands, shopping? N  Preparing Food and eating ? N  Using the Toilet? N  In the past six months, have you accidently leaked urine? N  Do you have problems with loss of bowel control? N  Managing your Medications? N  Managing your Finances? N  Housekeeping or managing your Housekeeping? N  Some recent data might be hidden    Patient Care Team: [name], MD as PCP - General (Family Medicine)  Anell Barr, [place] (Optometry) Earnestine Leys, MD (Specialist) Barbette Merino, NP as Nurse Practitioner (Nurse Practitioner) Corey Skains, MD as Consulting Physician (Cardiology) Blanca Friend, MD as Referring Physician (Oncology) De Hollingshead, [facility] as Pharmacist (Pharmacist)   Assessment:   This is a routine wellness examination for [facility].  Exercise Activities and Dietary recommendations Current Exercise Habits: Home exercise routine, Type of exercise: walking, Time (Minutes): 30, Frequency (Times/Week): 4, Weekly Exercise (Minutes/Week): 120, Intensity: Mild, Exercise limited by: None identified  Goals    . "I want  to stay in good health" (pt-stated)     Current Barriers:  . Patient with hx T2DM, HTN, HLD, and more recent dx of neuroendocrine carcinoma with liver mets. Followed Q3 months by [facility] oncology for lab work and CT scans . Chronic conditions: o T2DM: metformin 500 mg QAM; most recent A1c 5.5% o HTN: diltiazem CD 240 mg QAM; recent office BP 110-130s/70-80s, HR 70-90s o HLD: atorvastatin 20 mg QPM; last LDL 68  o Noted previous iron deficiency; taking iron supplement daily, denies any GI complaints   Pharmacist Clinical Goal(s):  [name] Over the next 90 days, patient will work with PharmD and primary care provider to address needs related to optimized health maintenance  Interventions: . Comprehensive medication review performed. Epic medication list updated . Encouraged continued medication adherence, physical activity. Patient denies any questions or concerns at this time o T2DM well controlled goal <7%, though goal <6.5% appropriate for this patient; recommend continued metformin 500 mg QAM o HTN well controlled goal <140/90, recommend continued diltiazem CD 240 mg daily w/ continued monitoring  o HLD well controlled goal LDL <70, recommend continued atorvastatin 20 mg daily w/ continued monitoring  Patient Self Care Activities:  . Self administers  medications as prescribed . Calls pharmacy for medication refills . Calls provider office for new concerns or questions  Initial goal documentation     . DIET - INCREASE WATER INTAKE     Recommend drinking at least 6-8 glasses of water a day     . Increase water intake     Recommend drinking at least 4-5 glasses of water a day        Fall Risk Fall Risk  01/14/2019 06/30/2018 12/23/2017 06/18/2017 02/19/2017  Falls in the past year? 0 0 No No No  Number falls in past yr: - - - - -  Injury with Fall? - - - - -  Comment - - - - -  Follow up - Falls evaluation completed - - -   FALL RISK PREVENTION PERTAINING TO THE HOME:  Any stairs in or around the home? Yes  If so, are there any without handrails? No   Home free of loose throw rugs in walkways, pet beds, electrical cords, etc? Yes  Adequate lighting in your home to reduce risk of falls? Yes   ASSISTIVE DEVICES UTILIZED TO PREVENT FALLS:  Life alert? No  Use of a cane, walker or w/c? No  Grab bars in the bathroom? Yes  Shower chair or bench in shower? No  Elevated toilet seat or a handicapped toilet? No    TIMED UP AND GO:  Was the test performed? Yes .  Length of time to ambulate 10 feet: 11 sec.   GAIT:  Appearance of gait: Gait steady and fast without the use of an assistive device.  Education: Fall risk prevention has been discussed.  Intervention(s) required? No   DME/home health order needed?  No    Depression Screen PHQ 2/9 Scores 01/14/2019 12/23/2017 06/18/2017 02/19/2017  PHQ - 2 Score 0 0 0 0    Cognitive Function     6CIT Screen 01/14/2019 12/23/2017 12/19/2016  What Year? 0 points 0 points 0 points  What month? 0 points 0 points 0 points  What time? 0 points 0 points 0 points  Count back from 20 0 points 0 points 0 points  Months in reverse 0 points 0 points 0 points  Repeat phrase 0 points 0 points 2 points  Total Score 0  0 2    Immunization History  Administered Date(s) Administered  . Influenza,  High Dose Seasonal PF 02/19/2017, 03/05/2018  . Influenza,inj,Quad PF,6+ Mos 03/15/2015  . Influenza-Unspecified 04/11/2016  . Pneumococcal Conjugate-13 02/03/2014  . Pneumococcal Polysaccharide-23 01/19/2016  . Pneumococcal-Unspecified 11/29/2010  . Td 04/20/2008, 06/30/2018  . Zoster 05/30/2009  . Zoster Recombinat (Shingrix) 12/09/2017, 06/24/2018    Qualifies for Shingles Vaccine? Completes shingrix series   Tdap: up to date   Flu Vaccine: Due 02/2019  Pneumococcal Vaccine: up to date   Screening Tests Health Maintenance  Topic Date Due  . FOOT EXAM  12/24/2018  . HEMOGLOBIN A1C  12/29/2018  . INFLUENZA VACCINE  01/10/2019  . OPHTHALMOLOGY EXAM  03/19/2019  . COLONOSCOPY  05/25/2019  . URINE MICROALBUMIN  07/01/2019  . TETANUS/TDAP  06/30/2028  . Hepatitis C Screening  Completed  . PNA vac Low Risk Adult  Completed   Cancer Screenings:  Colorectal Screening: Completed 05/24/2009. Repeat every 10 years;  Lung Cancer Screening: (Low Dose CT Chest recommended if Age 50-80 years, 30 pack-year currently smoking OR have quit w/in 15years.) does not qualify.    Additional Screening:  Hepatitis C Screening: does qualify; Completed 06/15/2015  Dental Screening: Recommended annual dental exams for proper oral hygiene  Community Resource Referral:  CRR required this visit?  No        Plan:  I have personally reviewed and addressed the Medicare Annual Wellness questionnaire and have noted the following in the patient's chart:  A. Medical and social history B. Use of alcohol, tobacco or illicit drugs  C. Current medications and supplements D. Functional ability and status E.  Nutritional status F.  Physical activity G. Advance directives H. List of other physicians I.  Hospitalizations, surgeries, and ER visits in previous 12 months J.  [place] such as hearing and vision if needed, cognitive and depression L. Referrals and appointments   In addition,  I have reviewed and discussed with patient certain preventive protocols, quality metrics, and best practice recommendations. A written personalized care plan for preventive services as well as general preventive health recommendations were provided to patient.   Signed,   Bevelyn Ngo, LPN  09/13/[id number] Nurse Health Advisor  Nurse Notes:

## 2019-01-14 NOTE — Patient Instructions (Signed)
Randy Harper , Thank you for taking time to come for your Medicare Wellness Visit. I appreciate your ongoing commitment to your health goals. Please review the following plan we discussed and let me know if I can assist you in the future.   Screening recommendations/referrals: Colonoscopy: completed 05/24/2009 Recommended yearly ophthalmology/optometry visit for glaucoma screening and checkup Recommended yearly dental visit for hygiene and checkup  Vaccinations: Influenza vaccine: due 02/2019 Pneumococcal vaccine: up to date Tdap vaccine: up to date Shingles vaccine: shingrix completed     Advanced directives: Please bring a copy of your health care power of attorney and living will to the office at your convenience.  Conditions/risks identified: diabetic, discussed chronic care management team- already in contact   Next appointment: Follow up in one year for your annual wellness visit.   Preventive Care 8 Years and Older, Male Preventive care refers to lifestyle choices and visits with your health care provider that can promote health and wellness. What does preventive care include?  A yearly physical exam. This is also called an annual well check.  Dental exams once or twice a year.  Routine eye exams. Ask your health care provider how often you should have your eyes checked.  Personal lifestyle choices, including:  Daily care of your teeth and gums.  Regular physical activity.  Eating a healthy diet.  Avoiding tobacco and drug use.  Limiting alcohol use.  Practicing safe sex.  Taking low doses of aspirin every day.  Taking vitamin and mineral supplements as recommended by your health care provider. What happens during an annual well check? The services and screenings done by your health care provider during your annual well check will depend on your age, overall health, lifestyle risk factors, and family history of disease. Counseling  Your health care provider may ask  you questions about your:  Alcohol use.  Tobacco use.  Drug use.  Emotional well-being.  Home and relationship well-being.  Sexual activity.  Eating habits.  History of falls.  Memory and ability to understand (cognition).  Work and work Statistician. Screening  You may have the following tests or measurements:  Height, weight, and BMI.  Blood pressure.  Lipid and cholesterol levels. These may be checked every 5 years, or more frequently if you are over 55 years [place].  Skin check.  Lung cancer screening. You may have this screening every year starting at age 73 if you have a 30-pack-year history of smoking and currently smoke or have quit within the past 15 years.  Fecal occult blood test (FOBT) of the stool. You may have this test every year starting at age 8.  Flexible sigmoidoscopy or colonoscopy. You may have a sigmoidoscopy every 5 years or a colonoscopy every 10 years starting at age 78.  Prostate cancer screening. Recommendations will vary depending on your family history and other risks.  Hepatitis C blood test.  Hepatitis B blood test.  Sexually transmitted disease (STD) testing.  Diabetes screening. This is done by checking your blood sugar (glucose) after you have not eaten for a while (fasting). You may have this done every 1-3 years.  Abdominal aortic aneurysm (AAA) screening. You may need this if you are a current or former smoker.  Osteoporosis. You may be screened starting at age 77 if you are at high risk. Talk with your health care provider about your test results, treatment options, and if necessary, the need for more tests. Vaccines  Your health care provider may recommend certain vaccines,  such as:  Influenza vaccine. This is recommended every year.  Tetanus, diphtheria, and acellular pertussis (Tdap, Td) vaccine. You may need a Td booster every 10 years.  Zoster vaccine. You may need this after age 33.  Pneumococcal 13-valent conjugate  (PCV13) vaccine. One dose is recommended after age 21.  Pneumococcal polysaccharide (PPSV23) vaccine. One dose is recommended after age 107. Talk to your health care provider about which screenings and vaccines you need and how often you need them. This information is not intended to replace advice given to you by your health care provider. Make sure you discuss any questions you have with your health care provider. Document Released: 06/24/2015 Document Revised: 02/15/2016 Document Reviewed: 03/29/2015 Elsevier Interactive Patient Education  2017 [place] Prevention in the Home Falls can cause injuries. They can happen to people of all ages. There are many things you can do to make your home safe and to help prevent falls. What can I do on the outside of my home?  Regularly fix the edges of walkways and driveways and fix any cracks.  Remove anything that might make you trip as you walk through a door, such as a raised step or threshold.  Trim any bushes or trees on the path to your home.  Use bright outdoor lighting.  Clear any walking paths of anything that might make someone trip, such as rocks or tools.  Regularly check to see if handrails are loose or broken. Make sure that both sides of any steps have handrails.  Any raised decks and porches should have guardrails on the edges.  Have any leaves, snow, or ice cleared regularly.  Use sand or salt on walking paths during winter.  Clean up any spills in your garage right away. This includes oil or grease spills. What can I do in the bathroom?  Use night lights.  Install grab bars by the toilet and in the tub and shower. Do not use towel bars as grab bars.  Use non-skid mats or decals in the tub or shower.  If you need to sit down in the shower, use a plastic, non-slip stool.  Keep the floor dry. Clean up any water that spills on the floor as soon as it happens.  Remove soap buildup in the tub or shower  regularly.  Attach bath mats securely with double-sided non-slip rug tape.  Do not have throw rugs and other things on the floor that can make you trip. What can I do in the bedroom?  Use night lights.  Make sure that you have a light by your bed that is easy to reach.  Do not use any sheets or blankets that are too big for your bed. They should not hang down onto the floor.  Have a firm chair that has side arms. You can use this for support while you get dressed.  Do not have throw rugs and other things on the floor that can make you trip. What can I do in the kitchen?  Clean up any spills right away.  Avoid walking on wet floors.  Keep items that you use a lot in easy-to-reach places.  If you need to reach something above you, use a strong step stool that has a grab bar.  Keep electrical cords out of the way.  Do not use floor polish or wax that makes floors slippery. If you must use wax, use non-skid floor wax.  Do not have throw rugs and other things on  the floor that can make you trip. What can I do with my stairs?  Do not leave any items on the stairs.  Make sure that there are handrails on both sides of the stairs and use them. Fix handrails that are broken or loose. Make sure that handrails are as long as the stairways.  Check any carpeting to make sure that it is firmly attached to the stairs. Fix any carpet that is loose or worn.  Avoid having throw rugs at the top or bottom of the stairs. If you do have throw rugs, attach them to the floor with carpet tape.  Make sure that you have a light switch at the top of the stairs and the bottom of the stairs. If you do not have them, ask someone to add them for you. What else can I do to help prevent falls?  Wear shoes that:  Do not have high heels.  Have rubber bottoms.  Are comfortable and fit you well.  Are closed at the toe. Do not wear sandals.  If you use a stepladder:  Make sure that it is fully  opened. Do not climb a closed stepladder.  Make sure that both sides of the stepladder are locked into place.  Ask someone to hold it for you, if possible.  Clearly mark and make sure that you can see:  Any grab bars or handrails.  First and last steps.  Where the edge of each step is.  Use tools that help you move around (mobility aids) if they are needed. These include:  Canes.  Walkers.  Scooters.  Crutches.  Turn on the lights when you go into a dark area. Replace any light bulbs as soon as they burn out.  Set up your furniture so you have a clear path. Avoid moving your furniture around.  If any of your floors are uneven, fix them.  If there are any pets around you, be aware of where they are.  Review your medicines with your doctor. Some medicines can make you feel dizzy. This can increase your chance of falling. Ask your doctor what other things that you can do to help prevent falls. This information is not intended to replace advice given to you by your health care provider. Make sure you discuss any questions you have with your health care provider. Document Released: 03/24/2009 Document Revised: 11/03/2015 Document Reviewed: 07/02/2014 Elsevier Interactive Patient Education  2017 [employer].

## 2019-01-15 LAB — BASIC METABOLIC PANEL
BUN/Creatinine Ratio: 16 (ref 10–24)
BUN: 12 mg/dL (ref 8–27)
CO2: 26 mmol/L (ref 20–29)
Calcium: 9.2 mg/dL (ref 8.6–10.2)
Chloride: 100 mmol/L (ref 96–106)
Creatinine, Ser: 0.77 mg/dL (ref 0.76–1.27)
GFR calc Af Amer: 106 mL/min/{1.73_m2} (ref >59)
GFR calc non Af Amer: 92 mL/min/{1.73_m2} (ref >59)
Glucose: 114 mg/dL — ABNORMAL HIGH (ref 65–99)
Potassium: 4.9 mmol/L (ref 3.5–5.2)
Sodium: 140 mmol/L (ref 134–144)

## 2019-01-19 ENCOUNTER — Encounter: Payer: Self-pay | Admitting: Family Medicine

## 2019-01-20 DIAGNOSIS — C7A8 Other malignant neuroendocrine tumors: Secondary | ICD-10-CM | POA: Diagnosis not present

## 2019-01-20 DIAGNOSIS — C7B8 Other secondary neuroendocrine tumors: Secondary | ICD-10-CM | POA: Diagnosis not present

## 2019-01-21 ENCOUNTER — Encounter: Payer: Self-pay | Admitting: Family Medicine

## 2019-01-21 ENCOUNTER — Ambulatory Visit (INDEPENDENT_AMBULATORY_CARE_PROVIDER_SITE_OTHER): Payer: Medicare Other | Admitting: Family Medicine

## 2019-01-21 ENCOUNTER — Other Ambulatory Visit: Payer: Self-pay

## 2019-01-21 DIAGNOSIS — C7B8 Other secondary neuroendocrine tumors: Secondary | ICD-10-CM | POA: Diagnosis not present

## 2019-01-21 DIAGNOSIS — C7A8 Other malignant neuroendocrine tumors: Secondary | ICD-10-CM | POA: Diagnosis not present

## 2019-01-21 DIAGNOSIS — I10 Essential (primary) hypertension: Secondary | ICD-10-CM

## 2019-01-21 DIAGNOSIS — E782 Mixed hyperlipidemia: Secondary | ICD-10-CM

## 2019-01-21 DIAGNOSIS — E1169 Type 2 diabetes mellitus with other specified complication: Secondary | ICD-10-CM

## 2019-01-21 NOTE — Assessment & Plan Note (Signed)
The current medical regimen is effective;  continue present plan and medications.  

## 2019-01-21 NOTE — Progress Notes (Addendum)
There were no vitals taken for this visit.   Subjective:    Patient ID: Randy Harper, male    DOB: 1950-10-01, 68 y.o.   MRN: [id number]  HPI: Randy Harper is a 68 y.o. male  Med check Patient follow-up doing well got good news on metastatic cancer to liver and has been graduated from 34-month follow-up to 72-month follow-up. Diabetes cholesterol blood pressure all doing well with no complaints. Taking medications without problems no low blood sugar spells or issues.  Relevant past medical, surgical, family and social history reviewed and updated as indicated. Interim medical history since our last visit reviewed. Allergies and medications reviewed and updated.  Review of Systems  Constitutional: Negative.   Respiratory: Negative.   Cardiovascular: Negative.     Per HPI unless specifically indicated above     Objective:    There were no vitals taken for this visit.  Wt Readings from Last 3 Encounters:  01/14/19 221 lb 11.2 oz (100.6 kg)  06/30/18 220 lb (99.8 kg)  12/23/17 217 lb (98.4 kg)    Physical Exam  Results for orders placed or performed in visit on 79/15/05  Basic Metabolic Panel (BMET)  Result Value Ref Range   Glucose 114 (H) 65 - 99 mg/dL   BUN 12 8 - 27 mg/dL   Creatinine, Ser 0.77 0.76 - 1.27 mg/dL   GFR calc non Af Amer 92 >59 mL/min/1.73   GFR calc Af Amer 106 >59 mL/min/1.73   BUN/Creatinine Ratio 16 10 - 24   Sodium 140 134 - 144 mmol/L   Potassium 4.9 3.5 - 5.2 mmol/L   Chloride 100 96 - 106 mmol/L   CO2 26 20 - 29 mmol/L   Calcium 9.2 8.6 - 10.2 mg/dL  Bayer DCA Hb A1c Waived (STAT)  Result Value Ref Range   HB A1C (BAYER DCA - WAIVED) 5.6 <7.0 %  LP+ALT+AST Piccolo, Waived (STAT)  Result Value Ref Range   ALT (SGPT) Piccolo, Waived 26 10 - 47 U/L   AST (SGOT) Piccolo, Waived 32 11 - 38 U/L   Cholesterol Piccolo, Waived 147 <200 mg/dL   HDL Chol Piccolo, Waived 47 (L) >59 mg/dL   Triglycerides Piccolo,Waived 113 <150 mg/dL   Chol/HDL  Ratio Piccolo,Waive 3.1 mg/dL   LDL Chol Calc Piccolo Waived 77 <100 mg/dL   VLDL Chol Calc Piccolo,Waive 23 <30 mg/dL      Assessment & Plan:   Problem List Items Addressed This Visit      Cardiovascular and Mediastinum   Benign essential HTN    The current medical regimen is effective;  continue present plan and medications.         Digestive   Neuroendocrine carcinoma metastatic to liver [facility])    The current medical regimen is effective;  continue present plan and medications.         Endocrine   Diabetes mellitus associated with hormonal etiology ([place])    The current medical regimen is effective;  continue present plan and medications.         Other   Hyperlipidemia    The current medical regimen is effective;  continue present plan and medications.         Telemedicine using audio/video telecommunications for a synchronous communication visit. Today's visit due to COVID-19 isolation precautions I connected with and verified that I am speaking with the correct person using two identifiers.   I discussed the limitations, risks, security and privacy concerns of performing an  evaluation and management service by telecommunication and the availability of in person appointments. I also discussed with the patient that there may be a patient responsible charge related to this service. The patient expressed understanding and agreed to proceed. The patient's location is home. I am at home.   I discussed the assessment and treatment plan with the patient. The patient was provided an opportunity to ask questions and all were answered. The patient agreed with the plan and demonstrated an understanding of the instructions.   The patient was advised to call back or seek an in-person evaluation if the symptoms worsen or if the condition fails to improve as anticipated.   I provided 21+ minutes of time during this encounter.  Follow up plan: Return in about 6 months (around  07/24/2019) for Physical Exam.

## 2019-01-28 DIAGNOSIS — Z1211 Encounter for screening for malignant neoplasm of colon: Secondary | ICD-10-CM | POA: Diagnosis not present

## 2019-01-28 DIAGNOSIS — C7A8 Other malignant neuroendocrine tumors: Secondary | ICD-10-CM | POA: Diagnosis not present

## 2019-01-28 DIAGNOSIS — C7B8 Other secondary neuroendocrine tumors: Secondary | ICD-10-CM | POA: Diagnosis not present

## 2019-02-03 ENCOUNTER — Ambulatory Visit: Payer: Medicare Other | Admitting: Pharmacist

## 2019-02-03 ENCOUNTER — Other Ambulatory Visit: Payer: Self-pay

## 2019-02-03 DIAGNOSIS — E1169 Type 2 diabetes mellitus with other specified complication: Secondary | ICD-10-CM

## 2019-02-03 DIAGNOSIS — I10 Essential (primary) hypertension: Secondary | ICD-10-CM

## 2019-02-03 NOTE — Patient Instructions (Signed)
Visit Information  Goals Addressed            This Visit's Progress     Patient Stated   . COMPLETED: "I want to stay in good health" (pt-stated)       Current Barriers:  . Patient with hx T2DM, HTN, HLD, and more recent dx of neuroendocrine carcinoma with liver mets. Previously followed Q3 months by [facility] oncology for lab work and CT scans, but has recently been extended to Q6 months . Chronic conditions: o T2DM: metformin 500 mg QAM; most recent A1c 5.6%; fill history up to date o HTN: diltiazem CD 240 mg QAM; recent office BP 110-130s/70-80s, HR 70-90s o HLD: atorvastatin 20 mg QPM; last LDL 77; fill history up to date o Noted previous iron deficiency; taking iron supplement daily  Pharmacist Clinical Goal(s):  [name] Over the next 90 days, patient will work with PharmD and primary care provider to address needs related to optimized health maintenance  Interventions: . Comprehensive medication review performed. Epic medication list updated . Encouraged continued medication adherence. Patient denies any further questions or needs at this time.  Patient Self Care Activities:  . Self administers medications as prescribed . Calls pharmacy for medication refills . Calls provider office for new concerns or questions  Please see past updates related to this goal by clicking on the "Past Updates" button in the selected goal         The patient verbalized understanding of instructions provided today and declined a print copy of patient instruction materials.   Plan:  - Patient has my contact information for any future questions or concerns.  - If future CCM support needed, the team would be happy to re-engage with a new referral.  [name], PharmD Clinical Pharmacist [place] [phone]

## 2019-02-03 NOTE — Chronic Care Management (AMB) (Signed)
  Chronic Care Management   Follow Up Note   02/03/2019 Name: RAYLEN LEDDON MRN: [id number] DOB: [birth year]  Referred by: [name], MD Reason for referral : Chronic Care Management (Medication Management)   Vladislav Wetzell Baysinger is a 68 y.o. year [place] male who is a primary care patient of Crissman, Jeannette How, MD. The CCM team was consulted for assistance with chronic disease management and care coordination needs.    Contacted patient telephonically for medication management review.  Review of patient status, including review of consultants reports, relevant laboratory and other test results, and collaboration with appropriate care team members and the patient's provider was performed as part of comprehensive patient evaluation and provision of chronic care management services.    SDOH (Social Determinants of Health) screening performed today: None. See Care Plan for related entries.   Outpatient Encounter Medications as of 02/03/2019  Medication Sig  . aspirin EC 81 MG tablet Take 81 mg by mouth daily.  [name] atorvastatin (LIPITOR) 20 MG tablet Take 1 tablet (20 mg total) by mouth daily.  . brimonidine (ALPHAGAN) 0.2 % ophthalmic solution Place 1 drop into the left eye daily.   [name] diltiazem (CARDIZEM CD) 240 MG 24 hr capsule Take 1 capsule (240 mg total) by mouth daily.  . ferrous sulfate 325 (65 FE) MG tablet Take 325 mg by mouth daily with breakfast.  . metFORMIN (GLUCOPHAGE) 500 MG tablet Take 1 tablet (500 mg total) by mouth daily with breakfast.  . Multiple Vitamin (MULTIVITAMIN) tablet Take 1 tablet by mouth daily.   No facility-administered encounter medications on file as of 02/03/2019.      Goals Addressed            This Visit's Progress     Patient Stated   . "I want to stay in good health" (pt-stated)       Current Barriers:  . Patient with hx T2DM, HTN, HLD, and more recent dx of neuroendocrine carcinoma with liver mets. Previously followed Q3 months by [facility] oncology for  lab work and CT scans, but has recently been extended to Q6 months . Chronic conditions: o T2DM: metformin 500 mg QAM; most recent A1c 5.6% o HTN: diltiazem CD 240 mg QAM; recent office BP 110-130s/70-80s, HR 70-90s o HLD: atorvastatin 20 mg QPM; last LDL 77 o Noted previous iron deficiency; taking iron supplement daily  Pharmacist Clinical Goal(s):  [name] Over the next 90 days, patient will work with PharmD and primary care provider to address needs related to optimized health maintenance  Interventions: . Comprehensive medication review performed. Epic medication list updated . Encouraged continued medication adherence. Patient denies any further questions or needs at this time  Patient Self Care Activities:  . Self administers medications as prescribed . Calls pharmacy for medication refills . Calls provider office for new concerns or questions  Please see past updates related to this goal by clicking on the "Past Updates" button in the selected goal         Plan:  - Patient has my contact information for any future questions or concerns.  - If future CCM support needed, the team would be happy to re-engage with a new referral.  [name], PharmD Clinical Pharmacist [place] [phone]

## 2019-02-20 DIAGNOSIS — H2513 Age-related nuclear cataract, bilateral: Secondary | ICD-10-CM | POA: Diagnosis not present

## 2019-02-20 DIAGNOSIS — E119 Type 2 diabetes mellitus without complications: Secondary | ICD-10-CM | POA: Diagnosis not present

## 2019-02-20 DIAGNOSIS — H353221 Exudative age-related macular degeneration, left eye, with active choroidal neovascularization: Secondary | ICD-10-CM | POA: Diagnosis not present

## 2019-02-20 DIAGNOSIS — H43821 Vitreomacular adhesion, right eye: Secondary | ICD-10-CM | POA: Diagnosis not present

## 2019-02-20 DIAGNOSIS — H353112 Nonexudative age-related macular degeneration, right eye, intermediate dry stage: Secondary | ICD-10-CM | POA: Diagnosis not present

## 2019-02-20 DIAGNOSIS — H353210 Exudative age-related macular degeneration, right eye, stage unspecified: Secondary | ICD-10-CM | POA: Diagnosis not present

## 2019-02-20 DIAGNOSIS — H35712 Central serous chorioretinopathy, left eye: Secondary | ICD-10-CM | POA: Diagnosis not present

## 2019-02-20 DIAGNOSIS — H401131 Primary open-angle glaucoma, bilateral, mild stage: Secondary | ICD-10-CM | POA: Diagnosis not present

## 2019-02-20 LAB — HM DIABETES EYE EXAM

## 2019-03-13 ENCOUNTER — Other Ambulatory Visit
Admission: RE | Admit: 2019-03-13 | Discharge: 2019-03-13 | Disposition: A | Discharge status: Home / Self Care | Payer: Medicare Other | Source: Ambulatory Visit | Attending: Gastroenterology | Admitting: Gastroenterology

## 2019-03-13 ENCOUNTER — Other Ambulatory Visit: Payer: Self-pay

## 2019-03-13 DIAGNOSIS — Z01812 Encounter for preprocedural laboratory examination: Secondary | ICD-10-CM | POA: Insufficient documentation

## 2019-03-13 DIAGNOSIS — Z20828 Contact with and (suspected) exposure to other viral communicable diseases: Secondary | ICD-10-CM | POA: Insufficient documentation

## 2019-03-13 LAB — SARS CORONAVIRUS 2 (TAT 6-24 HRS): SARS Coronavirus 2: NEGATIVE

## 2019-03-16 ENCOUNTER — Encounter: Payer: Self-pay | Admitting: *Deleted

## 2019-03-17 ENCOUNTER — Encounter
Admission: RE | Disposition: A | Discharge status: Home / Self Care | Payer: Self-pay | Source: Home / Self Care | Attending: Gastroenterology

## 2019-03-17 ENCOUNTER — Ambulatory Visit: Payer: Medicare Other | Admitting: Certified Registered"

## 2019-03-17 ENCOUNTER — Other Ambulatory Visit: Payer: Self-pay

## 2019-03-17 ENCOUNTER — Ambulatory Visit
Admission: RE | Admit: 2019-03-17 | Discharge: 2019-03-17 | Disposition: A | Discharge status: Home / Self Care | Payer: Medicare Other | Attending: Gastroenterology | Admitting: Gastroenterology

## 2019-03-17 ENCOUNTER — Encounter: Payer: Self-pay | Admitting: Gastroenterology

## 2019-03-17 DIAGNOSIS — D128 Benign neoplasm of rectum: Secondary | ICD-10-CM | POA: Diagnosis not present

## 2019-03-17 DIAGNOSIS — Z1211 Encounter for screening for malignant neoplasm of colon: Secondary | ICD-10-CM | POA: Diagnosis not present

## 2019-03-17 DIAGNOSIS — Q438 Other specified congenital malformations of intestine: Secondary | ICD-10-CM | POA: Diagnosis not present

## 2019-03-17 DIAGNOSIS — K621 Rectal polyp: Secondary | ICD-10-CM | POA: Insufficient documentation

## 2019-03-17 DIAGNOSIS — D123 Benign neoplasm of transverse colon: Secondary | ICD-10-CM | POA: Insufficient documentation

## 2019-03-17 DIAGNOSIS — Z79899 Other long term (current) drug therapy: Secondary | ICD-10-CM | POA: Insufficient documentation

## 2019-03-17 DIAGNOSIS — Z86711 Personal history of pulmonary embolism: Secondary | ICD-10-CM | POA: Insufficient documentation

## 2019-03-17 DIAGNOSIS — K5730 Diverticulosis of large intestine without perforation or abscess without bleeding: Secondary | ICD-10-CM | POA: Insufficient documentation

## 2019-03-17 DIAGNOSIS — I10 Essential (primary) hypertension: Secondary | ICD-10-CM | POA: Diagnosis not present

## 2019-03-17 DIAGNOSIS — K635 Polyp of colon: Secondary | ICD-10-CM | POA: Insufficient documentation

## 2019-03-17 DIAGNOSIS — Z7982 Long term (current) use of aspirin: Secondary | ICD-10-CM | POA: Diagnosis not present

## 2019-03-17 DIAGNOSIS — E785 Hyperlipidemia, unspecified: Secondary | ICD-10-CM | POA: Insufficient documentation

## 2019-03-17 DIAGNOSIS — K5790 Diverticulosis of intestine, part unspecified, without perforation or abscess without bleeding: Secondary | ICD-10-CM | POA: Diagnosis not present

## 2019-03-17 DIAGNOSIS — Z7984 Long term (current) use of oral hypoglycemic drugs: Secondary | ICD-10-CM | POA: Insufficient documentation

## 2019-03-17 DIAGNOSIS — E119 Type 2 diabetes mellitus without complications: Secondary | ICD-10-CM | POA: Insufficient documentation

## 2019-03-17 DIAGNOSIS — Z538 Procedure and treatment not carried out for other reasons: Secondary | ICD-10-CM | POA: Diagnosis not present

## 2019-03-17 HISTORY — DX: Cardiac arrhythmia, unspecified: I49.9

## 2019-03-17 HISTORY — PX: COLONOSCOPY WITH PROPOFOL: SHX5780

## 2019-03-17 HISTORY — DX: Strain of muscle and tendon of front wall of thorax, initial encounter: S29.011A

## 2019-03-17 HISTORY — DX: Other pulmonary embolism without acute cor pulmonale: I26.99

## 2019-03-17 LAB — GLUCOSE, CAPILLARY: Glucose-Capillary: 100 mg/dL — ABNORMAL HIGH (ref 70–99)

## 2019-03-17 SURGERY — COLONOSCOPY WITH PROPOFOL
Anesthesia: General

## 2019-03-17 MED ORDER — PHENYLEPHRINE HCL (PRESSORS) 10 MG/ML IV SOLN
INTRAVENOUS | Status: DC | PRN
  Administered 2019-03-17 (×2): 100 ug via INTRAVENOUS
  Administered 2019-03-17: 50 ug via INTRAVENOUS

## 2019-03-17 MED ORDER — PROPOFOL 10 MG/ML IV BOLUS
INTRAVENOUS | Status: DC | PRN
  Administered 2019-03-17: 50 mg via INTRAVENOUS

## 2019-03-17 MED ORDER — PROPOFOL 500 MG/50ML IV EMUL
INTRAVENOUS | Status: DC | PRN
  Administered 2019-03-17: 130 ug/kg/min via INTRAVENOUS

## 2019-03-17 MED ORDER — SODIUM CHLORIDE 0.9 % IV SOLN
INTRAVENOUS | Status: DC
  Administered 2019-03-17: 07:00:00 1000 mL via INTRAVENOUS

## 2019-03-17 MED ORDER — PROPOFOL 500 MG/50ML IV EMUL
INTRAVENOUS | Status: AC
  Filled 2019-03-17: qty 50

## 2019-03-17 NOTE — Transfer of Care (Signed)
Immediate Anesthesia Transfer of Care Note  Patient: Randy Harper  Procedure(s) Performed: COLONOSCOPY WITH PROPOFOL (N/A )  Patient Location: PACU  Anesthesia Type:General  Level of Consciousness: awake and drowsy  Airway & Oxygen Therapy: Patient Spontanous Breathing  Post-op Assessment: Report given to RN  Post vital signs: Reviewed and stable  Last Vitals:  Vitals Value Taken Time  BP    Temp    Pulse    Resp    SpO2      Last Pain:  Vitals:   03/17/19 0712  TempSrc: Tympanic  PainSc: 0-No pain         Complications: No apparent anesthesia complications

## 2019-03-17 NOTE — Anesthesia Preprocedure Evaluation (Signed)
Anesthesia Evaluation  Patient identified by MRN, date of birth, ID band Patient awake    Reviewed: Allergy & Precautions, H&P , NPO status , Patient's Chart, lab work & pertinent test results, reviewed documented beta blocker date and time   Airway Mallampati: II   Neck ROM: full    Dental  (+) Poor Dentition   Pulmonary neg pulmonary ROS,    Pulmonary exam normal        Cardiovascular Exercise Tolerance: Good hypertension, On Medications + dysrhythmias Supra Ventricular Tachycardia  Rhythm:regular Rate:Normal     Neuro/Psych  Neuromuscular disease negative psych ROS   GI/Hepatic negative GI ROS, Neg liver ROS,   Endo/Other  negative endocrine ROSdiabetes  Renal/GU negative Renal ROS  negative genitourinary   Musculoskeletal   Abdominal   Peds  Hematology negative hematology ROS (+)   Anesthesia Other Findings Past Medical History: 01/2017: Blood transfusion without reported diagnosis 01/2017: Cancer ([place])     Comment:  metastic neuroendocrine - liver No date: Diabetes mellitus without complication ([place]) No date: Dysrhythmia     Comment:  supraventricular Tach 2015: Glaucoma No date: Hyperlipidemia No date: Hypertension No date: Muscle strain of chest wall No date: PE (pulmonary thromboembolism) ([place]) Past Surgical History: 01/2017: CHOLECYSTECTOMY     Comment:  related to liver cancer No date: COLONOSCOPY No date: LAPAROSCOPY ABDOMEN DIAGNOSTIC 12/2016: resection liver total right lobe 01/2017: SMALL INTESTINE SURGERY     Comment:  cancer surgery No date: TENDON GRAFT; Right     Comment:  right thumb tendon graft at age 1  BMI    Body Mass Index: 33.52 kg/m     Reproductive/Obstetrics negative OB ROS                             Anesthesia Physical Anesthesia Plan  ASA: III  Anesthesia Plan: General   Post-op Pain Management:    Induction:   PONV Risk Score and  Plan:   Airway Management Planned:   Additional Equipment:   Intra-op Plan:   Post-operative Plan:   Informed Consent: I have reviewed the patients History and Physical, chart, labs and discussed the procedure including the risks, benefits and alternatives for the proposed anesthesia with the patient or authorized representative who has indicated his/her understanding and acceptance.     Dental Advisory Given  Plan Discussed with: CRNA  Anesthesia Plan Comments:         Anesthesia Quick Evaluation

## 2019-03-17 NOTE — Op Note (Signed)
[facility] Gastroenterology Patient Name: Randy Harper Procedure Date: 03/17/2019 7:19 AM MRN: [id number] Account #: [id number] Date of Birth: 1951/05/31 Admit Type: Outpatient Age: 68 Room: [facility] ENDO ROOM 3 Gender: Male Note Status: Finalized Procedure:            Colonoscopy Indications:          Screening for colorectal malignant neoplasm Providers:            Lollie Sails, MD Medicines:            Monitored Anesthesia Care Complications:        No immediate complications. Procedure:            Pre-Anesthesia Assessment:                       - ASA Grade Assessment: III - A patient with severe                        systemic disease.                       After obtaining informed consent, the colonoscope was                        passed under direct vision. Throughout the procedure,                        the patient's blood pressure, pulse, and oxygen                        saturations were monitored continuously. The                        Colonoscope was introduced through the anus with the                        intention of advancing to the cecum. The scope was                        advanced to the ascending colon before the procedure                        was aborted. Medications were given. The colonoscopy                        was extremely difficult due to a redundant colon and                        significant looping. Successful completion of the                        procedure was aided by applying abdominal pressure. The                        patient tolerated the procedure well. Findings:      Multiple small and large-mouthed diverticula were found in the sigmoid       colon, descending colon, transverse colon and ascending colon.      The sigmoid colon, descending colon and transverse colon were       significantly redundant.      Two sessile polyps  were found in the rectum. The polyps were 1 to 2 mm       in size. These polyps  were removed with a cold biopsy forceps. Resection       and retrieval were complete.      Two sessile polyps were found in the distal transverse colon. The polyps       were 2 to 5 mm in size. These polyps were removed with a cold snare.       Resection and retrieval were complete.      A 3 mm polyp was found in the proximal transverse colon. The polyp was       sessile. The polyp was removed with a cold biopsy forceps. Resection and       retrieval were complete.      I advanced to the mid to proximal ascending colon, the valve and most of       the base of the cecum were easily appreciated with several different       angles of observation and no larger lesions were seen, however due to       looping and redundancy of the pathway, I was unable to intubate the       cecum, despite several efforts and position changes.      The retroflexed view of the distal rectum and anal verge was normal and       showed no anal or rectal abnormalities.      The digital rectal exam was normal. Impression:           - Diverticulosis in the sigmoid colon, in the                        descending colon, in the transverse colon and in the                        ascending colon.                       - Redundant colon.                       - Two 1 to 2 mm polyps in the rectum, removed with a                        cold biopsy forceps. Resected and retrieved.                       - Two 2 to 5 mm polyps in the distal transverse colon,                        removed with a cold snare. Resected and retrieved.                       - One 3 mm polyp in the proximal transverse colon,                        removed with a cold biopsy forceps. Resected and                        retrieved.                       -  The distal rectum and anal verge are normal on                        retroflexion view. Recommendation:       - Discharge patient to home.                       - Soft diet today, then advance as  tolerated to advance                        diet as tolerated.                       - Await pathology results.                       - Telephone GI clinic for pathology results in 5 days. Procedure Code(s):    --- Professional ---                       [phone], 82, Colonoscopy, flexible; with removal of                        tumor(s), polyp(s), or other lesion(s) by snare                        technique                       45380, 59,52, Colonoscopy, flexible; with biopsy,                        single or multiple Diagnosis Code(s):    --- Professional ---                       Z12.11, Encounter for screening for malignant neoplasm                        of colon                       K62.1, Rectal polyp                       K63.5, Polyp of colon                       K57.30, Diverticulosis of large intestine without                        perforation or abscess without bleeding                       Q43.8, Other specified congenital malformations of                        intestine CPT copyright 2019 American Medical Association. All rights reserved. The codes documented in this report are preliminary and upon coder review may  be revised to meet current compliance requirements. Lollie Sails, MD 03/17/2019 9:04:25 AM This report has been signed electronically. Number of Addenda: 0 Note Initiated On: 03/17/2019 7:19 AM Scope Withdrawal Time: 0 hours 4 minutes 40 seconds  Total Procedure Duration: 0 hours 52 minutes 23  seconds       [facility]

## 2019-03-17 NOTE — Anesthesia Post-op Follow-up Note (Signed)
Anesthesia QCDR form completed.        

## 2019-03-17 NOTE — H&P (Signed)
Outpatient short stay form Pre-procedure 03/17/2019 7:51 AM Randy Sails MD  Primary Physician: Randy Gully PA  Reason for visit: Colonoscopy  History of present illness: Patient is a 43 year Randy Harper male presenting today for colonoscopy.  This is for screening purposes.  He tolerated his prep well.  He takes a 81 mg aspirin daily that is been held.  He takes no other aspirin product or blood thinning agent.  He has a history of metastatic neuroendocrine tumor and has undergone a liver lobectomy as well as a partial small bowel resection in this regard.  Is done well from this.  Had no chemotherapy.  He tolerated his prep well.    Current Facility-Administered Medications:  .  0.9 %  sodium chloride infusion, , Intravenous, Continuous, Randy Sails, MD, Last Rate: 20 mL/hr at 03/17/19 0723, 1,000 mL at 03/17/19 [id number]  Medications Prior to Admission  Medication Sig Dispense Refill Last Dose  . aspirin EC 81 MG tablet Take 81 mg by mouth daily.   Past Month at Unknown time  . atorvastatin (LIPITOR) 20 MG tablet Take 1 tablet (20 mg total) by mouth daily. 90 tablet 4 03/16/2019 at Unknown time  . brimonidine (ALPHAGAN) 0.2 % ophthalmic solution Place 1 drop into the left eye daily.   3 Past Week at Unknown time  . diltiazem (CARDIZEM CD) 240 MG 24 hr capsule Take 1 capsule (240 mg total) by mouth daily. 90 capsule 4 03/16/2019 at Unknown time  . ferrous sulfate 325 (65 FE) MG tablet Take 325 mg by mouth daily with breakfast.   Past Week at Unknown time  . metFORMIN (GLUCOPHAGE) 500 MG tablet Take 1 tablet (500 mg total) by mouth daily with breakfast. 90 tablet 4 Past Week at Unknown time  . Multiple Vitamin (MULTIVITAMIN) tablet Take 1 tablet by mouth daily.   Past Week at Unknown time     No Known Allergies   Past Medical History:  Diagnosis Date  . Blood transfusion without reported diagnosis 01/2017  . Cancer ([place]) 01/2017   metastic neuroendocrine - liver  . Diabetes  mellitus without complication ([place])   . Dysrhythmia    supraventricular Tach  . Glaucoma 2015  . Hyperlipidemia   . Hypertension   . Muscle strain of chest wall   . PE (pulmonary thromboembolism) ([place])     Review of systems:      Physical Exam    Heart and lungs: Regular rate and rhythm without rub or gallop lungs are bilaterally clear    HEENT: Normocephalic atraumatic eyes are anicteric    Other:    Pertinant exam for procedure: Soft nontender nondistended bowel sounds positive normoactive    Planned proceedures: Colonoscopy and indicated procedures. I have discussed the risks benefits and complications of procedures to include not limited to bleeding, infection, perforation and the risk of sedation and the patient wishes to proceed.    Randy Sails, MD Gastroenterology 03/17/2019  7:51 AM

## 2019-03-17 NOTE — Anesthesia Postprocedure Evaluation (Signed)
Anesthesia Post Note  Patient: Randy Harper  Procedure(s) Performed: COLONOSCOPY WITH PROPOFOL (N/A )  Patient location during evaluation: PACU Anesthesia Type: General Level of consciousness: awake and alert Pain management: pain level controlled Vital Signs Assessment: post-procedure vital signs reviewed and stable Respiratory status: spontaneous breathing, nonlabored ventilation, respiratory function stable and patient connected to nasal cannula oxygen Cardiovascular status: blood pressure returned to baseline and stable Postop Assessment: no apparent nausea or vomiting Anesthetic complications: no     Last Vitals:  Vitals:   03/17/19 0910 03/17/19 0920  BP: 108/65 120/72  Pulse: 80 80  Resp: 17 17  Temp:    SpO2: 98% 98%    Last Pain:  Vitals:   03/17/19 0850  TempSrc: Tympanic  PainSc:                  Molli Barrows

## 2019-03-18 LAB — SURGICAL PATHOLOGY

## 2019-03-20 DIAGNOSIS — H43821 Vitreomacular adhesion, right eye: Secondary | ICD-10-CM | POA: Diagnosis not present

## 2019-03-20 DIAGNOSIS — H353221 Exudative age-related macular degeneration, left eye, with active choroidal neovascularization: Secondary | ICD-10-CM | POA: Diagnosis not present

## 2019-03-20 DIAGNOSIS — H353112 Nonexudative age-related macular degeneration, right eye, intermediate dry stage: Secondary | ICD-10-CM | POA: Diagnosis not present

## 2019-03-20 DIAGNOSIS — E119 Type 2 diabetes mellitus without complications: Secondary | ICD-10-CM | POA: Diagnosis not present

## 2019-04-17 DIAGNOSIS — E119 Type 2 diabetes mellitus without complications: Secondary | ICD-10-CM | POA: Diagnosis not present

## 2019-04-17 DIAGNOSIS — H353112 Nonexudative age-related macular degeneration, right eye, intermediate dry stage: Secondary | ICD-10-CM | POA: Diagnosis not present

## 2019-04-17 DIAGNOSIS — H43811 Vitreous degeneration, right eye: Secondary | ICD-10-CM | POA: Diagnosis not present

## 2019-04-17 DIAGNOSIS — H353221 Exudative age-related macular degeneration, left eye, with active choroidal neovascularization: Secondary | ICD-10-CM | POA: Diagnosis not present

## 2019-06-01 DIAGNOSIS — H353112 Nonexudative age-related macular degeneration, right eye, intermediate dry stage: Secondary | ICD-10-CM | POA: Diagnosis not present

## 2019-06-01 DIAGNOSIS — H43811 Vitreous degeneration, right eye: Secondary | ICD-10-CM | POA: Diagnosis not present

## 2019-06-01 DIAGNOSIS — H43821 Vitreomacular adhesion, right eye: Secondary | ICD-10-CM | POA: Diagnosis not present

## 2019-06-01 DIAGNOSIS — H353221 Exudative age-related macular degeneration, left eye, with active choroidal neovascularization: Secondary | ICD-10-CM | POA: Diagnosis not present

## 2019-06-22 ENCOUNTER — Encounter: Payer: Medicare Other | Admitting: Family Medicine

## 2019-06-23 ENCOUNTER — Ambulatory Visit: Payer: Medicare Other | Attending: Internal Medicine

## 2019-06-23 DIAGNOSIS — Z20822 Contact with and (suspected) exposure to covid-19: Secondary | ICD-10-CM

## 2019-06-25 LAB — NOVEL CORONAVIRUS, NAA: SARS-CoV-2, NAA: NOT DETECTED

## 2019-06-29 ENCOUNTER — Other Ambulatory Visit: Payer: Self-pay

## 2019-06-29 DIAGNOSIS — E119 Type 2 diabetes mellitus without complications: Secondary | ICD-10-CM

## 2019-06-29 NOTE — Telephone Encounter (Signed)
Please see if he has enough to get him to his appointment in 1 month.

## 2019-06-29 NOTE — Telephone Encounter (Addendum)
Medication refill request for Cardizem and Glucophage. Upcoming appointment 08-07-19

## 2019-06-29 NOTE — Telephone Encounter (Signed)
Called and spoke to patient. He stated he has enough medication to make it to his appt

## 2019-07-02 ENCOUNTER — Ambulatory Visit: Payer: Medicare Other | Attending: Internal Medicine

## 2019-07-02 DIAGNOSIS — Z23 Encounter for immunization: Secondary | ICD-10-CM | POA: Insufficient documentation

## 2019-07-02 NOTE — Progress Notes (Signed)
   Covid-19 Vaccination Clinic  Name:  Randy Harper    MRN: [id number] DOB: 11-04-1950  07/02/2019  Randy Harper was observed post Covid-19 immunization for 15 minutes without incidence. He was provided with Vaccine Information Sheet and instruction to access the V-Safe system.   Randy Harper was instructed to call 911 with any severe reactions post vaccine: [name] Difficulty breathing  . Swelling of your face and throat  . A fast heartbeat  . A bad rash all over your body  . Dizziness and weakness    Immunizations Administered    Name Date Dose VIS Date Route   Pfizer COVID-19 Vaccine 07/02/2019  9:31 AM 0.3 mL 05/22/2019 Intramuscular   Manufacturer: [place]   Lot: [id number]   [place]: [id number]

## 2019-07-13 DIAGNOSIS — E119 Type 2 diabetes mellitus without complications: Secondary | ICD-10-CM | POA: Diagnosis not present

## 2019-07-13 DIAGNOSIS — H353221 Exudative age-related macular degeneration, left eye, with active choroidal neovascularization: Secondary | ICD-10-CM | POA: Diagnosis not present

## 2019-07-13 DIAGNOSIS — H353112 Nonexudative age-related macular degeneration, right eye, intermediate dry stage: Secondary | ICD-10-CM | POA: Diagnosis not present

## 2019-07-13 DIAGNOSIS — H43821 Vitreomacular adhesion, right eye: Secondary | ICD-10-CM | POA: Diagnosis not present

## 2019-07-14 ENCOUNTER — Other Ambulatory Visit: Payer: Self-pay

## 2019-07-14 DIAGNOSIS — E119 Type 2 diabetes mellitus without complications: Secondary | ICD-10-CM

## 2019-07-14 MED ORDER — DILTIAZEM HCL ER COATED BEADS 240 MG PO CP24: 240.0000 mg | ORAL_CAPSULE | Freq: Every day | ORAL | 1 refills | Status: DC

## 2019-07-14 MED ORDER — METFORMIN HCL 500 MG PO TABS: 500.0000 mg | ORAL_TABLET | Freq: Every day | ORAL | 1 refills | Status: DC

## 2019-07-14 NOTE — Telephone Encounter (Signed)
Patient last seen 01/21/19 and has appointment 08/07/19

## 2019-07-20 ENCOUNTER — Other Ambulatory Visit: Payer: Self-pay

## 2019-07-20 NOTE — Telephone Encounter (Signed)
Refill request for Atovastatin LOV: 01/21/2019 Next Appt: 08/07/2019

## 2019-07-20 NOTE — Telephone Encounter (Signed)
Please see iif he has enough to make it to his appointment with Apolonio Schneiders

## 2019-07-20 NOTE — Telephone Encounter (Signed)
Patient states he has enough medication to get to his appointment.

## 2019-07-21 DIAGNOSIS — C7A8 Other malignant neuroendocrine tumors: Secondary | ICD-10-CM | POA: Diagnosis not present

## 2019-07-21 DIAGNOSIS — C7B8 Other secondary neuroendocrine tumors: Secondary | ICD-10-CM | POA: Diagnosis not present

## 2019-07-21 DIAGNOSIS — K5730 Diverticulosis of large intestine without perforation or abscess without bleeding: Secondary | ICD-10-CM | POA: Diagnosis not present

## 2019-07-23 ENCOUNTER — Ambulatory Visit: Payer: Medicare Other | Attending: Internal Medicine

## 2019-07-23 ENCOUNTER — Other Ambulatory Visit: Payer: Self-pay

## 2019-07-23 DIAGNOSIS — Z23 Encounter for immunization: Secondary | ICD-10-CM | POA: Insufficient documentation

## 2019-07-23 NOTE — Progress Notes (Signed)
   Covid-19 Vaccination Clinic  Name:  DEBORAH BRANSCOME    MRN: [id number] DOB: [birth year]  07/23/2019  Mr. Stanforth was observed post Covid-19 immunization for 15 minutes without incidence. He was provided with Vaccine Information Sheet and instruction to access the V-Safe system.   Mr. Barriere was instructed to call 911 with any severe reactions post vaccine: [name] Difficulty breathing  . Swelling of your face and throat  . A fast heartbeat  . A bad rash all over your body  . Dizziness and weakness    Immunizations Administered    Name Date Dose VIS Date Route   Pfizer COVID-19 Vaccine 07/23/2019  9:03 AM 0.3 mL 05/22/2019 Intramuscular   Manufacturer: [place]   Lot: [id number]   [place]: [id number]

## 2019-08-03 ENCOUNTER — Other Ambulatory Visit: Payer: Self-pay

## 2019-08-03 MED ORDER — ATORVASTATIN CALCIUM 20 MG PO TABS: 20.0000 mg | ORAL_TABLET | Freq: Every day | ORAL | 1 refills | Status: DC

## 2019-08-03 NOTE — Telephone Encounter (Signed)
Fax from [facility] for refills on atorvastatin tab 20 mg

## 2019-08-05 ENCOUNTER — Ambulatory Visit: Payer: Medicare Other

## 2019-08-06 ENCOUNTER — Encounter: Payer: Medicare Other | Admitting: Family Medicine

## 2019-08-06 DIAGNOSIS — I4892 Unspecified atrial flutter: Secondary | ICD-10-CM | POA: Diagnosis not present

## 2019-08-06 DIAGNOSIS — E782 Mixed hyperlipidemia: Secondary | ICD-10-CM | POA: Diagnosis not present

## 2019-08-06 DIAGNOSIS — I10 Essential (primary) hypertension: Secondary | ICD-10-CM | POA: Diagnosis not present

## 2019-08-06 DIAGNOSIS — I480 Paroxysmal atrial fibrillation: Secondary | ICD-10-CM | POA: Diagnosis not present

## 2019-08-06 DIAGNOSIS — I700 Atherosclerosis of aorta: Secondary | ICD-10-CM | POA: Diagnosis not present

## 2019-08-07 ENCOUNTER — Ambulatory Visit (INDEPENDENT_AMBULATORY_CARE_PROVIDER_SITE_OTHER): Payer: Medicare Other | Admitting: Family Medicine

## 2019-08-07 ENCOUNTER — Other Ambulatory Visit: Payer: Self-pay

## 2019-08-07 ENCOUNTER — Encounter: Payer: Self-pay | Admitting: Family Medicine

## 2019-08-07 VITALS — BP 133/84 | HR 72 | Temp 97.7°F | Ht 68.6 in | Wt 224.0 lb

## 2019-08-07 DIAGNOSIS — Z0000 Encounter for general adult medical examination without abnormal findings: Secondary | ICD-10-CM

## 2019-08-07 DIAGNOSIS — E782 Mixed hyperlipidemia: Secondary | ICD-10-CM

## 2019-08-07 DIAGNOSIS — E1159 Type 2 diabetes mellitus with other circulatory complications: Secondary | ICD-10-CM | POA: Diagnosis not present

## 2019-08-07 DIAGNOSIS — E1169 Type 2 diabetes mellitus with other specified complication: Secondary | ICD-10-CM

## 2019-08-07 DIAGNOSIS — N400 Enlarged prostate without lower urinary tract symptoms: Secondary | ICD-10-CM

## 2019-08-07 DIAGNOSIS — C7B8 Other secondary neuroendocrine tumors: Secondary | ICD-10-CM

## 2019-08-07 DIAGNOSIS — C7A8 Other malignant neuroendocrine tumors: Secondary | ICD-10-CM

## 2019-08-07 DIAGNOSIS — I10 Essential (primary) hypertension: Secondary | ICD-10-CM | POA: Diagnosis not present

## 2019-08-07 DIAGNOSIS — E119 Type 2 diabetes mellitus without complications: Secondary | ICD-10-CM

## 2019-08-07 LAB — UA/M W/RFLX CULTURE, ROUTINE
Bilirubin, UA: NEGATIVE
Glucose, UA: NEGATIVE
Ketones, UA: NEGATIVE
Leukocytes,UA: NEGATIVE
Nitrite, UA: NEGATIVE
Protein,UA: NEGATIVE
RBC, UA: NEGATIVE
Specific Gravity, UA: 1.02 (ref 1.005–1.030)
Urobilinogen, Ur: 2 mg/dL — ABNORMAL HIGH (ref 0.2–1.0)
pH, UA: 7 (ref 5.0–7.5)

## 2019-08-07 LAB — MICROALBUMIN, URINE WAIVED
Creatinine, Urine Waived: 100 mg/dL (ref 10–300)
Microalb, Ur Waived: 10 mg/L (ref 0–19)
Microalb/Creat Ratio: <30 mg/g (ref <30)

## 2019-08-07 NOTE — Assessment & Plan Note (Signed)
Followed by Oncology, continue per their recommendations

## 2019-08-07 NOTE — Assessment & Plan Note (Signed)
BPs stable and WNL, continue current regimen 

## 2019-08-07 NOTE — Assessment & Plan Note (Signed)
Stable without new concerns, continue to monitor and recheck PSA

## 2019-08-07 NOTE — Assessment & Plan Note (Signed)
Recheck lipids, adjust as needed. Continue current regimen and lifestyle habits

## 2019-08-07 NOTE — Progress Notes (Signed)
BP 133/84   Pulse 72   Temp 97.7 F (36.5 C) (Oral)   Ht 5' 8.6" (1.742 m)   Wt 224 lb (101.6 kg)   SpO2 97%   BMI 33.47 kg/m    Subjective:    Patient ID: Randy Harper, male    DOB: 04-26-1951, 69 y.o.   MRN: [id number]  HPI: Randy Harper is a 69 y.o. male presenting on 08/07/2019 for comprehensive medical examination. Current medical complaints include:see below  HTN - Followed by Cardiology, BPs stable on home checks around 120-130/80. Denies CP, SOB, HAs, dizziness. Taking medications faithfully. Trying to stay active and eat healthy diet.   DM - Not checking home BSs. No low blood sugar spells, tolerating metformin well. Following healthy diet.   BPH - No new urinary concerns.   He currently lives with: Interim Problems from his last visit: no  Depression Screen done today and results listed below:  Depression screen Randy Harper 2/9 01/14/2019 12/23/2017 06/18/2017 02/19/2017 12/19/2016  Decreased Interest 0 0 0 0 0  Down, Depressed, Hopeless 0 0 0 0 0  PHQ - 2 Score 0 0 0 0 0    The patient does not have a history of falls. I did complete a risk assessment for falls. A plan of care for falls was documented.   Past Medical History:  Past Medical History:  Diagnosis Date  . Blood transfusion without reported diagnosis 01/2017  . Cancer ([place]) 01/2017   metastic neuroendocrine - liver  . Diabetes mellitus without complication ([place])   . Dysrhythmia    supraventricular Tach  . Glaucoma 2015  . Hyperlipidemia   . Hypertension   . Muscle strain of chest wall   . PE (pulmonary thromboembolism) ([place])     Surgical History:  Past Surgical History:  Procedure Laterality Date  . CHOLECYSTECTOMY  01/2017   related to liver cancer  . COLONOSCOPY    . COLONOSCOPY WITH PROPOFOL N/A 03/17/2019   Procedure: COLONOSCOPY WITH PROPOFOL;  Surgeon: Lollie Sails, MD;  Location: [facility] ENDOSCOPY;  Service: Endoscopy;  Laterality: N/A;  . LAPAROSCOPY ABDOMEN DIAGNOSTIC    . resection  liver total right lobe  12/2016  . SMALL INTESTINE SURGERY  01/2017   cancer surgery  . TENDON GRAFT Right    right thumb tendon graft at age 40     Medications:  Current Outpatient Medications on File Prior to Visit  Medication Sig  . Aflibercept 2 MG/0.05ML SOLN Apply to eye. Every 6 weeks injection  . atorvastatin (LIPITOR) 20 MG tablet Take 1 tablet (20 mg total) by mouth daily.  . brimonidine (ALPHAGAN) 0.2 % ophthalmic solution Place 1 drop into the left eye daily.   [name] diltiazem (CARDIZEM CD) 240 MG 24 hr capsule Take 1 capsule (240 mg total) by mouth daily.  . metFORMIN (GLUCOPHAGE) 500 MG tablet Take 1 tablet (500 mg total) by mouth daily with breakfast.  . Multiple Vitamin (MULTIVITAMIN) tablet Take 1 tablet by mouth daily.   No current facility-administered medications on file prior to visit.    Allergies:  No Known Allergies  Social History:  Social History   Socioeconomic History  . Marital status: Married    Spouse name: Not on file  . Number of children: Not on file  . Years of education: Not on file  . Highest education level: Master's degree (e.g., MA, MS, MEng, MEd, MSW, MBA)  Occupational History  . Not on file  Tobacco Use  . Smoking  status: Never Smoker  . Smokeless tobacco: Never Used  Substance and Sexual Activity  . Alcohol use: No  . Drug use: No  . Sexual activity: Yes    Birth control/protection: None  Other Topics Concern  . Not on file  Social History Narrative  . Not on file   Social Determinants of Health   Financial Resource Strain:   . Difficulty of Paying Living Expenses: Not on file  Food Insecurity:   . Worried About Charity fundraiser in the Last Year: Not on file  . Ran Out of Food in the Last Year: Not on file  Transportation Needs:   . Lack of Transportation (Medical): Not on file  . Lack of Transportation (Non-Medical): Not on file  Physical Activity:   . Days of Exercise per Week: Not on file  . Minutes of Exercise  per Session: Not on file  Stress:   . Feeling of Stress : Not on file  Social Connections:   . Frequency of Communication with Friends and Family: Not on file  . Frequency of Social Gatherings with Friends and Family: Not on file  . Attends Religious Services: Not on file  . Active Member of Clubs or Organizations: Not on file  . Attends Archivist Meetings: Not on file  . Marital Status: Not on file  Intimate Partner Violence:   . Fear of Current or Ex-Partner: Not on file  . Emotionally Abused: Not on file  . Physically Abused: Not on file  . Sexually Abused: Not on file   Social History   Tobacco Use  Smoking Status Never Smoker  Smokeless Tobacco Never Used   Social History   Substance and Sexual Activity  Alcohol Use No    Family History:  Family History  Problem Relation Age of Onset  . Heart disease Father   . Diabetes Maternal Uncle   . Diabetes Maternal Uncle   . Diabetes Maternal Uncle   . Diabetes Maternal Uncle     Past medical history, surgical history, medications, allergies, family history and social history reviewed with patient today and changes made to appropriate areas of the chart.   Review of Systems - General ROS: negative Psychological ROS: negative Ophthalmic ROS: negative ENT ROS: negative Allergy and Immunology ROS: negative Hematological and Lymphatic ROS: negative Endocrine ROS: negative Breast ROS: negative for breast lumps Respiratory ROS: no cough, shortness of breath, or wheezing Cardiovascular ROS: no chest pain or dyspnea on exertion Gastrointestinal ROS: no abdominal pain, change in bowel habits, or black or bloody stools Genito-Urinary ROS: no dysuria, trouble voiding, or hematuria Musculoskeletal ROS: negative Neurological ROS: no TIA or stroke symptoms Dermatological ROS: negative All other ROS negative except what is listed above and in the HPI.      Objective:    BP 133/84   Pulse 72   Temp 97.7 F (36.5  C) (Oral)   Ht 5' 8.6" (1.742 m)   Wt 224 lb (101.6 kg)   SpO2 97%   BMI 33.47 kg/m   Wt Readings from Last 3 Encounters:  08/07/19 224 lb (101.6 kg)  03/17/19 214 lb (97.1 kg)  01/14/19 221 lb 11.2 oz (100.6 kg)    Physical Exam Vitals and nursing note reviewed.  Constitutional:      General: He is not in acute distress.    Appearance: He is well-developed.  HENT:     Head: Atraumatic.     Right Ear: Tympanic membrane and external ear normal.  Left Ear: Tympanic membrane and external ear normal.     Nose: Nose normal.     Mouth/Throat:     Mouth: Mucous membranes are moist.     Pharynx: Oropharynx is clear.  Eyes:     General: No scleral icterus.    Conjunctiva/sclera: Conjunctivae normal.     Pupils: Pupils are equal, round, and reactive to light.  Cardiovascular:     Rate and Rhythm: Normal rate and regular rhythm.     Heart sounds: Normal heart sounds. No murmur.  Pulmonary:     Effort: Pulmonary effort is normal. No respiratory distress.     Breath sounds: Normal breath sounds.  Abdominal:     General: Bowel sounds are normal. There is no distension.     Palpations: Abdomen is soft. There is no mass.     Tenderness: There is no abdominal tenderness. There is no guarding.  Genitourinary:    Comments: GU exam declined Musculoskeletal:        General: No tenderness. Normal range of motion.     Cervical back: Normal range of motion and neck supple.  Skin:    General: Skin is warm and dry.     Findings: No rash.  Neurological:     General: No focal deficit present.     Mental Status: He is alert and oriented to person, place, and time.     Deep Tendon Reflexes: Reflexes are normal and symmetric.  Psychiatric:        Mood and Affect: Mood normal.        Behavior: Behavior normal.        Thought Content: Thought content normal.        Judgment: Judgment normal.     Results for orders placed or performed in visit on 06/23/19  Novel Coronavirus, NAA  ([facility])   Specimen: Nasopharyngeal(NP) swabs in vial transport medium   NASOPHARYNGE  TESTING  Result Value Ref Range   SARS-CoV-2, NAA Not Detected Not Detected      Assessment & Plan:   Problem List Items Addressed This Visit      Cardiovascular and Mediastinum   Hypertension associated with diabetes ([place])    BPs stable and WNL, continue current regimen      Relevant Orders   CBC with Differential/Platelet   Comprehensive metabolic panel   TSH   UA/M w/rflx Culture, Routine     Digestive   Neuroendocrine carcinoma metastatic to liver ([place])    Followed by Oncology, continue per their recommendations        Endocrine   Diabetes mellitus associated with hormonal etiology ([place])    Recheck A1C, adjust as needed. Continue current regimen and healthy lifestyle      Relevant Orders   HgB A1c   Microalbumin, Urine Waived     Genitourinary   BPH (benign prostatic hyperplasia)    Stable without new concerns, continue to monitor and recheck PSA      Relevant Orders   PSA     Other   Hyperlipidemia    Recheck lipids, adjust as needed. Continue current regimen and lifestyle habits      Relevant Orders   Lipid Panel w/o Chol/HDL Ratio    Other Visit Diagnoses    Annual physical exam    -  Primary   Diabetes mellitus without complication ([place])           Discussed aspirin prophylaxis for myocardial infarction prevention and decision was not currently taking  LABORATORY TESTING:  Health maintenance labs ordered today as discussed above.   The natural history of prostate cancer and ongoing controversy regarding screening and potential treatment outcomes of prostate cancer has been discussed with the patient. The meaning of a false positive PSA and a false negative PSA has been discussed. He indicates understanding of the limitations of this screening test and wishes to proceed with screening PSA testing.   IMMUNIZATIONS:   - Tdap: Tetanus vaccination status  reviewed: last tetanus booster within 10 years. - Influenza: Up to date - Pneumovax: Up to date - Prevnar: Up to date - HPV: Not applicable - Zostavax vaccine: Up to date  SCREENING: - Colonoscopy: Up to date  Discussed with patient purpose of the colonoscopy is to detect colon cancer at curable precancerous or early stages   PATIENT COUNSELING:    Sexuality: Discussed sexually transmitted diseases, partner selection, use of condoms, avoidance of unintended pregnancy  and contraceptive alternatives.   Advised to avoid cigarette smoking.  I discussed with the patient that most people either abstain from alcohol or drink within safe limits (<=14/week and <=4 drinks/occasion for males, <=7/weeks and <= 3 drinks/occasion for females) and that the risk for alcohol disorders and other health effects rises proportionally with the number of drinks per week and how often a drinker exceeds daily limits.  Discussed cessation/primary prevention of drug use and availability of treatment for abuse.   Diet: Encouraged to adjust caloric intake to maintain  or achieve ideal body weight, to reduce intake of dietary saturated fat and total fat, to limit sodium intake by avoiding high sodium foods and not adding table salt, and to maintain adequate dietary potassium and calcium preferably from fresh fruits, vegetables, and low-fat dairy products.    stressed the importance of regular exercise  Injury prevention: Discussed safety belts, safety helmets, smoke detector, smoking near bedding or upholstery.   Dental health: Discussed importance of regular tooth brushing, flossing, and dental visits.   Follow up plan: NEXT PREVENTATIVE PHYSICAL DUE IN 1 YEAR. Return in about 6 months (around 02/04/2020) for 6 month f/u.

## 2019-08-07 NOTE — Assessment & Plan Note (Signed)
Recheck A1C, adjust as needed. Continue current regimen and healthy lifestyle

## 2019-08-08 LAB — PSA: Prostate Specific Ag, Serum: 1.5 ng/mL (ref 0.0–4.0)

## 2019-08-08 LAB — COMPREHENSIVE METABOLIC PANEL
ALT: 14 IU/L (ref 0–44)
AST: 19 IU/L (ref 0–40)
Albumin/Globulin Ratio: 1.3 (ref 1.2–2.2)
Albumin: 3.9 g/dL (ref 3.8–4.8)
Alkaline Phosphatase: 93 IU/L (ref 39–117)
BUN/Creatinine Ratio: 11 (ref 10–24)
BUN: 10 mg/dL (ref 8–27)
Bilirubin Total: 0.4 mg/dL (ref 0.0–1.2)
CO2: 27 mmol/L (ref 20–29)
Calcium: 9 mg/dL (ref 8.6–10.2)
Chloride: 102 mmol/L (ref 96–106)
Creatinine, Ser: 0.89 mg/dL (ref 0.76–1.27)
GFR calc Af Amer: 100 mL/min/{1.73_m2} (ref >59)
GFR calc non Af Amer: 87 mL/min/{1.73_m2} (ref >59)
Globulin, Total: 3 g/dL (ref 1.5–4.5)
Glucose: 99 mg/dL (ref 65–99)
Potassium: 4.5 mmol/L (ref 3.5–5.2)
Sodium: 140 mmol/L (ref 134–144)
Total Protein: 6.9 g/dL (ref 6.0–8.5)

## 2019-08-08 LAB — CBC WITH DIFFERENTIAL/PLATELET
Basophils Absolute: 0 10*3/uL (ref 0.0–0.2)
Basos: 1 %
EOS (ABSOLUTE): 0.2 10*3/uL (ref 0.0–0.4)
Eos: 4 %
Hematocrit: 42.7 % (ref 37.5–51.0)
Hemoglobin: 14.8 g/dL (ref 13.0–17.7)
Immature Grans (Abs): 0 10*3/uL (ref 0.0–0.1)
Immature Granulocytes: 0 %
Lymphocytes Absolute: 1.1 10*3/uL (ref 0.7–3.1)
Lymphs: 24 %
MCH: 31.7 pg (ref 26.6–33.0)
MCHC: 34.7 g/dL (ref 31.5–35.7)
MCV: 91 fL (ref 79–97)
Monocytes Absolute: 0.6 10*3/uL (ref 0.1–0.9)
Monocytes: 12 %
Neutrophils Absolute: 2.9 10*3/uL (ref 1.4–7.0)
Neutrophils: 59 %
Platelets: 179 10*3/uL (ref 150–450)
RBC: 4.67 x10E6/uL (ref 4.14–5.80)
RDW: 11.7 % (ref 11.6–15.4)
WBC: 4.8 10*3/uL (ref 3.4–10.8)

## 2019-08-08 LAB — LIPID PANEL W/O CHOL/HDL RATIO
Cholesterol, Total: 138 mg/dL (ref 100–199)
HDL: 43 mg/dL (ref >39)
LDL Chol Calc (NIH): 75 mg/dL (ref 0–99)
Triglycerides: 108 mg/dL (ref 0–149)
VLDL Cholesterol Cal: 20 mg/dL (ref 5–40)

## 2019-08-08 LAB — HEMOGLOBIN A1C
Est. average glucose Bld gHb Est-mCnc: 117 mg/dL
Hgb A1c MFr Bld: 5.7 % — ABNORMAL HIGH (ref 4.8–5.6)

## 2019-08-08 LAB — TSH: TSH: 0.999 u[IU]/mL (ref 0.450–4.500)

## 2019-08-12 ENCOUNTER — Other Ambulatory Visit: Payer: Self-pay

## 2019-08-12 ENCOUNTER — Telehealth: Payer: Self-pay

## 2019-08-12 MED ORDER — DILTIAZEM HCL ER COATED BEADS 240 MG PO CP24: 240.0000 mg | ORAL_CAPSULE | Freq: Every day | ORAL | 1 refills | Status: DC

## 2019-08-12 NOTE — Telephone Encounter (Signed)
Fax from [facility] requesting new Rx for medication for mail delivery.

## 2019-08-12 NOTE — Telephone Encounter (Signed)
Copied from [place] [id number]. Topic: General - Other >> Aug 12, 2019 10:25 AM Rainey Pines A wrote: Patient would like a callback once prior authorization has been complete for diltiazem (CARDIZEM CD) 240 MG 24 hr capsule . [place], [place] [employer]  Phone:  [phone] Fax:  [phone]   PA initiated and submitted via cover my meds. Key: [id number]

## 2019-08-12 NOTE — Telephone Encounter (Signed)
PA not needed per Cover My Meds, states it is a covered medication. Called and let patient know this. Explained that we sent a new RX in last month and patient states he may have been using an order RX from Dr. Jeananne Rama that had no more refills. He states that he is going to reach back out to the pharmacy and see. Will let us know if he needs [place].

## 2019-08-13 DIAGNOSIS — H01132 Eczematous dermatitis of right lower eyelid: Secondary | ICD-10-CM | POA: Diagnosis not present

## 2019-08-13 DIAGNOSIS — D2261 Melanocytic nevi of right upper limb, including shoulder: Secondary | ICD-10-CM | POA: Diagnosis not present

## 2019-08-13 DIAGNOSIS — L729 Follicular cyst of the skin and subcutaneous tissue, unspecified: Secondary | ICD-10-CM | POA: Diagnosis not present

## 2019-08-13 DIAGNOSIS — D485 Neoplasm of uncertain behavior of skin: Secondary | ICD-10-CM | POA: Diagnosis not present

## 2019-08-13 DIAGNOSIS — H01135 Eczematous dermatitis of left lower eyelid: Secondary | ICD-10-CM | POA: Diagnosis not present

## 2019-08-13 DIAGNOSIS — D225 Melanocytic nevi of trunk: Secondary | ICD-10-CM | POA: Diagnosis not present

## 2019-08-13 DIAGNOSIS — D2262 Melanocytic nevi of left upper limb, including shoulder: Secondary | ICD-10-CM | POA: Diagnosis not present

## 2019-08-25 DIAGNOSIS — H43811 Vitreous degeneration, right eye: Secondary | ICD-10-CM | POA: Diagnosis not present

## 2019-08-25 DIAGNOSIS — H353112 Nonexudative age-related macular degeneration, right eye, intermediate dry stage: Secondary | ICD-10-CM | POA: Diagnosis not present

## 2019-08-25 DIAGNOSIS — H43821 Vitreomacular adhesion, right eye: Secondary | ICD-10-CM | POA: Diagnosis not present

## 2019-08-25 DIAGNOSIS — H353221 Exudative age-related macular degeneration, left eye, with active choroidal neovascularization: Secondary | ICD-10-CM | POA: Diagnosis not present

## 2019-10-20 DIAGNOSIS — H353221 Exudative age-related macular degeneration, left eye, with active choroidal neovascularization: Secondary | ICD-10-CM | POA: Diagnosis not present

## 2019-10-20 DIAGNOSIS — H353112 Nonexudative age-related macular degeneration, right eye, intermediate dry stage: Secondary | ICD-10-CM | POA: Diagnosis not present

## 2019-10-20 DIAGNOSIS — H43813 Vitreous degeneration, bilateral: Secondary | ICD-10-CM | POA: Diagnosis not present

## 2019-10-20 DIAGNOSIS — E119 Type 2 diabetes mellitus without complications: Secondary | ICD-10-CM | POA: Diagnosis not present

## 2019-10-27 DIAGNOSIS — Z86711 Personal history of pulmonary embolism: Secondary | ICD-10-CM | POA: Diagnosis not present

## 2019-10-27 DIAGNOSIS — C7B8 Other secondary neuroendocrine tumors: Secondary | ICD-10-CM | POA: Diagnosis not present

## 2019-10-27 DIAGNOSIS — D3A8 Other benign neuroendocrine tumors: Secondary | ICD-10-CM | POA: Diagnosis not present

## 2019-10-27 DIAGNOSIS — C7A8 Other malignant neuroendocrine tumors: Secondary | ICD-10-CM | POA: Diagnosis not present

## 2019-10-27 DIAGNOSIS — Z08 Encounter for follow-up examination after completed treatment for malignant neoplasm: Secondary | ICD-10-CM | POA: Diagnosis not present

## 2019-10-27 DIAGNOSIS — Z9089 Acquired absence of other organs: Secondary | ICD-10-CM | POA: Diagnosis not present

## 2019-10-27 DIAGNOSIS — Z85038 Personal history of other malignant neoplasm of large intestine: Secondary | ICD-10-CM | POA: Diagnosis not present

## 2019-10-27 DIAGNOSIS — Z9889 Other specified postprocedural states: Secondary | ICD-10-CM | POA: Diagnosis not present

## 2019-10-27 DIAGNOSIS — Z8505 Personal history of malignant neoplasm of liver: Secondary | ICD-10-CM | POA: Diagnosis not present

## 2019-10-28 DIAGNOSIS — D3A8 Other benign neuroendocrine tumors: Secondary | ICD-10-CM | POA: Insufficient documentation

## 2019-11-23 ENCOUNTER — Other Ambulatory Visit: Payer: Self-pay | Admitting: Family Medicine

## 2019-11-23 DIAGNOSIS — E119 Type 2 diabetes mellitus without complications: Secondary | ICD-10-CM

## 2019-11-23 NOTE — Telephone Encounter (Signed)
Requested Prescriptions  Pending Prescriptions Disp Refills  . metFORMIN (GLUCOPHAGE) 500 MG tablet [Pharmacy Med Name: MetFORMIN 500MG TABLET] 90 tablet 0    Sig: TAKE 1 TABLET BY MOUTH  DAILY WITH BREAKFAST     Endocrinology:  Diabetes - Biguanides Passed - 11/23/2019 10:10 PM      Passed - Cr in normal range and within 360 days    Creatinine, Ser  Date Value Ref Range Status  08/07/2019 0.89 0.76 - 1.27 mg/dL Final         Passed - HBA1C is between 0 and 7.9 and within 180 days    Hemoglobin A1C  Date Value Ref Range Status  01/19/2016 6.3  Final   HB A1C (BAYER DCA - WAIVED)  Date Value Ref Range Status  01/14/2019 5.6 <7.0 % Final    Comment:                                          Diabetic Adult            <7.0                                       Healthy Adult        4.3 - 5.7                                                           (DCCT/NGSP) American Diabetes Association's Summary of Glycemic Recommendations for Adults with Diabetes: Hemoglobin A1c <7.0%. More stringent glycemic goals (A1c <6.0%) may further reduce complications at the cost of increased risk of hypoglycemia.    Hgb A1c MFr Bld  Date Value Ref Range Status  08/07/2019 5.7 (H) 4.8 - 5.6 % Final    Comment:             Prediabetes: 5.7 - 6.4          Diabetes: >6.4          Glycemic control for adults with diabetes: <7.0          Passed - eGFR in normal range and within 360 days    GFR calc Af Amer  Date Value Ref Range Status  08/07/2019 100 >59 mL/min/1.73 Final   GFR calc non Af Amer  Date Value Ref Range Status  08/07/2019 87 >59 mL/min/1.73 Final         Passed - Valid encounter within last 6 months    Recent Outpatient Visits          3 months ago Annual physical exam   [place], [place]   10 months ago Benign essential HTN   [facility] [place], Jeannette How, MD   1 year ago Essential hypertension   [facility]  [place], Jeannette How, MD   1 year ago Essential hypertension   [place], Jeannette How, MD   2 years ago Essential hypertension   [place], Jeannette How, MD      Future Appointments            In 1 month  [place], [place]   In 2 months Orene Desanctis, Lilia Argue, PA-C [facility]

## 2019-12-13 ENCOUNTER — Other Ambulatory Visit: Payer: Self-pay | Admitting: Family Medicine

## 2019-12-29 DIAGNOSIS — E119 Type 2 diabetes mellitus without complications: Secondary | ICD-10-CM | POA: Diagnosis not present

## 2019-12-29 DIAGNOSIS — H353112 Nonexudative age-related macular degeneration, right eye, intermediate dry stage: Secondary | ICD-10-CM | POA: Diagnosis not present

## 2019-12-29 DIAGNOSIS — H353221 Exudative age-related macular degeneration, left eye, with active choroidal neovascularization: Secondary | ICD-10-CM | POA: Diagnosis not present

## 2019-12-29 DIAGNOSIS — H43813 Vitreous degeneration, bilateral: Secondary | ICD-10-CM | POA: Diagnosis not present

## 2020-01-06 ENCOUNTER — Telehealth: Payer: Self-pay | Admitting: Family Medicine

## 2020-01-06 NOTE — Telephone Encounter (Signed)
Copied from [place] [id number]. Topic: Medicare AWV >> Jan 06, 2020 11:31 AM Cher Nakai R wrote: Reason for CRM:  7/28 Left detailed message to let patient know that his AWVS on Jan 18, 2020 at 10:30 am, will be by phone instead of coming in.  Please verify number [facility] is to contact patient that day -srs

## 2020-01-12 ENCOUNTER — Ambulatory Visit: Payer: Medicare Other | Admitting: Family Medicine

## 2020-01-15 DIAGNOSIS — L84 Corns and callosities: Secondary | ICD-10-CM | POA: Diagnosis not present

## 2020-01-15 DIAGNOSIS — L298 Other pruritus: Secondary | ICD-10-CM | POA: Diagnosis not present

## 2020-01-15 DIAGNOSIS — L728 Other follicular cysts of the skin and subcutaneous tissue: Secondary | ICD-10-CM | POA: Diagnosis not present

## 2020-01-18 ENCOUNTER — Ambulatory Visit (INDEPENDENT_AMBULATORY_CARE_PROVIDER_SITE_OTHER): Payer: Medicare Other

## 2020-01-18 VITALS — Ht 68.0 in | Wt 200.0 lb

## 2020-01-18 DIAGNOSIS — Z0000 Encounter for general adult medical examination without abnormal findings: Secondary | ICD-10-CM

## 2020-01-18 NOTE — Progress Notes (Addendum)
I connected with Randy Harper today by telephone and verified that I am speaking with the correct person using two identifiers. Location patient: home Location provider: work Persons participating in the virtual visit: Randy Harper, [name] LPN.   I discussed the limitations, risks, security and privacy concerns of performing an evaluation and management service by telephone and the availability of in person appointments. I also discussed with the patient that there may be a patient responsible charge related to this service. The patient expressed understanding and verbally consented to this telephonic visit.    Interactive audio and video telecommunications were attempted between this provider and patient, however failed, due to patient having technical difficulties OR patient did not have access to video capability.  We continued and completed visit with audio only.    Vital signs may be patient reported or missing.    Subjective:   Randy Harper is a 69 y.o. male who presents for Medicare Annual/Subsequent preventive examination.  Review of Systems     Cardiac Risk Factors include: advanced age (>75men, >28 women);diabetes mellitus;dyslipidemia;hypertension;male gender;obesity (BMI >30kg/m2)     Objective:    Today's Vitals   01/18/20 1026  Weight: 200 lb (90.7 kg)  Height: 5\' 8"  (1.727 m)   Body mass index is 30.41 kg/m.  Advanced Directives 01/18/2020 03/17/2019 01/14/2019 12/23/2017 12/19/2016  Does Patient Have a Medical Advance Directive? Yes Yes Yes Yes Yes  Type of Paramedic of [place];Living will Living will Living will;Healthcare Power of [place];Living will  Copy of [place] in Chart? No - copy requested - No - copy requested No - copy requested No - copy requested    Current Medications (verified) Outpatient Encounter Medications as of 01/18/2020    Medication Sig  . Aflibercept 2 MG/0.05ML SOLN Apply to eye. Every 6 weeks injection  . atorvastatin (LIPITOR) 20 MG tablet TAKE 1 TABLET BY MOUTH  DAILY  . brimonidine (ALPHAGAN) 0.2 % ophthalmic solution Place 1 drop into the left eye daily.   [name] diltiazem (CARDIZEM CD) 240 MG 24 hr capsule Take 1 capsule (240 mg total) by mouth daily.  . metFORMIN (GLUCOPHAGE) 500 MG tablet TAKE 1 TABLET BY MOUTH  DAILY WITH BREAKFAST  . Multiple Vitamin (MULTIVITAMIN) tablet Take 1 tablet by mouth daily.   No facility-administered encounter medications on file as of 01/18/2020.    Allergies (verified) Patient has no known allergies.   History: Past Medical History:  Diagnosis Date  . Blood transfusion without reported diagnosis 01/2017  . 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 Diabetes mellitus without complication ([place])   . Dysrhythmia    supraventricular Tach  . Glaucoma 2015  . Hyperlipidemia   . Hypertension   . Muscle strain of chest wall   . PE (pulmonary thromboembolism) ([place])    Past Surgical History:  Procedure Laterality Date  . CHOLECYSTECTOMY  01/2017   related to liver cancer  . COLONOSCOPY    . COLONOSCOPY WITH PROPOFOL N/A 03/17/2019   Procedure: COLONOSCOPY WITH PROPOFOL;  Surgeon: Lollie Sails, MD;  [facility] ENDOSCOPY;  Service: Endoscopy;  Laterality: N/A;  . LAPAROSCOPY ABDOMEN DIAGNOSTIC    . resection liver total right lobe  12/2016  . SMALL INTESTINE SURGERY  01/2017   cancer surgery  . TENDON GRAFT Right    right thumb tendon graft at age 57    Family History  Problem Relation  Age of Onset  . Heart disease Father   . Diabetes Maternal Uncle   . Diabetes Maternal Uncle   . Diabetes Maternal Uncle   . Diabetes Maternal Uncle    Social History   Socioeconomic History  . Marital status: Married    Spouse name: Not on file  . Number of children: Not on file  . Years of education: Not on file  . Highest education level: Master's  degree (e.g., MA, MS, MEng, MEd, MSW, MBA)  Occupational History  . Occupation: retired  Tobacco Use  . Smoking status: Never Smoker  . Smokeless tobacco: Never Used  Vaping Use  . Vaping Use: Never used  Substance and Sexual Activity  . Alcohol use: No  . Drug use: No  . Sexual activity: Yes    Birth control/protection: None  Other Topics Concern  . Not on file  Social History Narrative  . Not on file   Social Determinants of Health   Financial Resource Strain: Low Risk   . Difficulty of Paying Living Expenses: Not hard at all  Food Insecurity: No Food Insecurity  . Worried About Charity fundraiser in the Last Year: Never true  . Ran Out of Food in the Last Year: Never true  Transportation Needs: No Transportation Needs  . Lack of Transportation (Medical): No  . Lack of Transportation (Non-Medical): No  Physical Activity: Insufficiently Active  . Days of Exercise per Week: 5 days  . Minutes of Exercise per Session: 20 min  Stress: No Stress Concern Present  . Feeling of Stress : Not at all  Social Connections:   . Frequency of Communication with Friends and Family:   . Frequency of Social Gatherings with Friends and Family:   . Attends Religious Services:   . Active Member of Clubs or Organizations:   . Attends Archivist Meetings:   [name] Marital Status:     Tobacco Counseling Counseling given: Not Answered   Clinical Intake:  Pre-visit preparation completed: Yes  Pain : No/denies pain     Nutritional Status: BMI > 30  Obese Nutritional Risks: None Diabetes: Yes  How often do you need to have someone help you when you read instructions, pamphlets, or other written materials from your doctor or pharmacy?: 1 - Never What is the last grade level you completed in school?: master's degree  Diabetic? Yes Nutrition Risk Assessment:  Has the patient had any N/V/D within the last 2 months?  No  Does the patient have any non-healing wounds?  No  Has  the patient had any unintentional weight loss or weight gain?  No   Diabetes:  Is the patient diabetic?  Yes  If diabetic, was a CBG obtained today?  No  Did the patient bring in their glucometer from home?  No  How often do you monitor your CBG's? Not regularly.   Financial Strains and Diabetes Management:  Are you having any financial strains with the device, your supplies or your medication? No .  Does the patient want to be seen by Chronic Care Management for management of their diabetes?  No  Would the patient like to be referred to a Nutritionist or for Diabetic Management?  No   Diabetic Exams:  Diabetic Eye Exam: Completed 02/20/2019 Diabetic Foot Exam: Overdue, Pt has been advised about the importance in completing this exam. Pt is scheduled for diabetic foot exam on next appointment.   Interpreter Needed?: No  Information entered by :: NAllen  LPN   Activities of Daily Living In your present state of health, do you have any difficulty performing the following activities: 01/18/2020  Hearing? N  Vision? N  Difficulty concentrating or making decisions? N  Walking or climbing stairs? N  Dressing or bathing? N  Doing errands, shopping? N  Preparing Food and eating ? N  Using the Toilet? N  In the past six months, have you accidently leaked urine? N  Do you have problems with loss of bowel control? N  Managing your Medications? N  Managing your Finances? N  Housekeeping or managing your Housekeeping? N  Some recent data might be hidden    Patient Care Team: Volney American, PA-C as PCP - General (Family Medicine) Anell Barr, [place] (Optometry) Earnestine Leys, MD (Specialist) Barbette Merino, NP as Nurse Practitioner (Nurse Practitioner) Corey Skains, MD as Consulting Physician (Cardiology) Blanca Friend, MD as Referring Physician (Oncology) De Hollingshead, [facility] as Pharmacist (Pharmacist)  Indicate any recent Medical Services you may have  received from other than [facility] providers in the past year (date may be approximate).     Assessment:   This is a routine wellness examination for [facility].  Hearing/Vision screen  Hearing Screening   125Hz  250Hz  500Hz  1000Hz  2000Hz  3000Hz  4000Hz  6000Hz  8000Hz   Right ear:           Left ear:           Vision Screening Comments: Regular eye exams, Dr. Ellin Mayhew  Dietary issues and exercise activities discussed: Current Exercise Habits: Home exercise routine, Type of exercise: walking, Time (Minutes): 20, Frequency (Times/Week): 5, Weekly Exercise (Minutes/Week): 100  Goals    . DIET - INCREASE WATER INTAKE     Recommend drinking at least 6-8 glasses of water a day     . Increase water intake     Recommend drinking at least 4-5 glasses of water a day     . Patient Stated     01/18/2020, keep diabetes under control      Depression Screen PHQ 2/9 Scores 01/18/2020 01/14/2019 12/23/2017 06/18/2017 02/19/2017 12/19/2016 05/17/2016  PHQ - 2 Score 0 0 0 0 0 0 0    Fall Risk Fall Risk  01/18/2020 01/14/2019 06/30/2018 12/23/2017 06/18/2017  Falls in the past year? 0 0 0 No No  Number falls in past yr: - - - - -  Injury with Fall? - - - - -  Comment - - - - -  Risk for fall due to : Medication side effect - - - -  Follow up Falls evaluation completed;Education provided;Falls prevention discussed - Falls evaluation completed - -    Any stairs in or around the home? Yes  If so, are there any without handrails? Yes  Home free of loose throw rugs in walkways, pet beds, electrical cords, etc? Yes  Adequate lighting in your home to reduce risk of falls? Yes   ASSISTIVE DEVICES UTILIZED TO PREVENT FALLS:  Life alert? No  Use of a cane, walker or w/c? No  Grab bars in the bathroom? Yes  Shower chair or bench in shower? Yes  Elevated toilet seat or a handicapped toilet? Yes   TIMED UP AND GO:  Was the test performed? No .    Cognitive Function:     6CIT Screen 01/18/2020 01/14/2019 12/23/2017 12/19/2016   What Year? 0 points 0 points 0 points 0 points  What month? 0 points 0 points 0 points 0 points  What time? 3 points 0 points 0 points 0 points  Count back from 20 0 points 0 points 0 points 0 points  Months in reverse 0 points 0 points 0 points 0 points  Repeat phrase 0 points 0 points 0 points 2 points  Total Score 3 0 0 2    Immunizations Immunization History  Administered Date(s) Administered  . Fluad Quad(high Dose 65+) 03/12/2019  . Influenza, High Dose Seasonal PF 02/19/2017, 03/05/2018  . Influenza,inj,Quad PF,6+ Mos 03/15/2015  . Influenza-Unspecified 04/11/2016, 03/12/2019  . PFIZER SARS-COV-2 Vaccination 07/02/2019, 07/23/2019  . Pneumococcal Conjugate-13 02/03/2014  . Pneumococcal Polysaccharide-23 01/19/2016  . Pneumococcal-Unspecified 11/29/2010  . Td 04/20/2008, 06/30/2018  . Zoster 05/30/2009  . Zoster Recombinat (Shingrix) 12/09/2017, 06/24/2018    TDAP status: Up to date Flu Vaccine status: Up to date Pneumococcal vaccine status: Up to date Covid-19 vaccine status: Completed vaccines  Qualifies for Shingles Vaccine? Yes   Zostavax completed Yes   Shingrix Completed?: Yes  Screening Tests Health Maintenance  Topic Date Due  . FOOT EXAM  12/24/2018  . INFLUENZA VACCINE  01/10/2020  . HEMOGLOBIN A1C  02/04/2020  . OPHTHALMOLOGY EXAM  02/20/2020  . URINE MICROALBUMIN  08/06/2020  . COLONOSCOPY  03/16/2024  . TETANUS/TDAP  06/30/2028  . COVID-19 Vaccine  Completed  . Hepatitis C Screening  Completed  . PNA vac Low Risk Adult  Completed    Health Maintenance  Health Maintenance Due  Topic Date Due  . FOOT EXAM  12/24/2018  . INFLUENZA VACCINE  01/10/2020    Colorectal cancer screening: Completed 03/17/2019. Repeat every 5 years  Lung Cancer Screening: (Low Dose CT Chest recommended if Age 28-80 years, 30 pack-year currently smoking OR have quit w/in 15years.) does not qualify.   Lung Cancer Screening Referral: no  Additional  Screening:  Hepatitis C Screening: does qualify; Completed 06/15/2015  Vision Screening: Recommended annual ophthalmology exams for early detection of glaucoma and other disorders of the eye. Is the patient up to date with their annual eye exam?  Yes  Who is the provider or what is the name of the office in which the patient attends annual eye exams? Dr. Ellin Mayhew If pt is not established with a provider, would they like to be referred to a provider to establish care? No .   Dental Screening: Recommended annual dental exams for proper oral hygiene  Community Resource Referral / Chronic Care Management: CRR required this visit?  No   CCM required this visit?  No      Plan:     I have personally reviewed and noted the following in the patient's chart:   . Medical and social history . Use of alcohol, tobacco or illicit drugs  . Current medications and supplements . Functional ability and status . Nutritional status . Physical activity . Advanced directives . List of other physicians . Hospitalizations, surgeries, and ER visits in previous 12 months . Vitals . Screenings to include cognitive, depression, and falls . Referrals and appointments  In addition, I have reviewed and discussed with patient certain preventive protocols, quality metrics, and best practice recommendations. A written personalized care plan for preventive services as well as general preventive health recommendations were provided to patient.   Due to this being a telephonic visit, the after visit summary with patients personalized plan was offered to patient via mail or my-chart.  Patient would like to access on my-chart  Kellie Simmering, LPN   10/14/8125   Nurse Notes:

## 2020-01-18 NOTE — Patient Instructions (Signed)
Randy Harper , Thank you for taking time to come for your Medicare Wellness Visit. I appreciate your ongoing commitment to your health goals. Please review the following plan we discussed and let me know if I can assist you in the future.   Screening recommendations/referrals: Colonoscopy: completed 03/17/2019 Recommended yearly ophthalmology/optometry visit for glaucoma screening and checkup Recommended yearly dental visit for hygiene and checkup  Vaccinations: Influenza vaccine: due Pneumococcal vaccine: completed 03/12/2019 Tdap vaccine: completed 06/30/2018 Shingles vaccine: completed   Covid-19: 07/23/2019, 07/02/2019  Advanced directives: Please bring a copy of your POA (Power of Attorney) and/or Living Will to your next appointment.    Conditions/risks identified: none  Next appointment: Follow up in one year for your annual wellness visit.   Preventive Care 71 Years and Older, Male Preventive care refers to lifestyle choices and visits with your health care provider that can promote health and wellness. What does preventive care include?  A yearly physical exam. This is also called an annual well check.  Dental exams once or twice a year.  Routine eye exams. Ask your health care provider how often you should have your eyes checked.  Personal lifestyle choices, including:  Daily care of your teeth and gums.  Regular physical activity.  Eating a healthy diet.  Avoiding tobacco and drug use.  Limiting alcohol use.  Practicing safe sex.  Taking low doses of aspirin every day.  Taking vitamin and mineral supplements as recommended by your health care provider. What happens during an annual well check? The services and screenings done by your health care provider during your annual well check will depend on your age, overall health, lifestyle risk factors, and family history of disease. Counseling  Your health care provider may ask you questions about your:  Alcohol  use.  Tobacco use.  Drug use.  Emotional well-being.  Home and relationship well-being.  Sexual activity.  Eating habits.  History of falls.  Memory and ability to understand (cognition).  Work and work Statistician. Screening  You may have the following tests or measurements:  Height, weight, and BMI.  Blood pressure.  Lipid and cholesterol levels. These may be checked every 5 years, or more frequently if you are over 88 years [place].  Skin check.  Lung cancer screening. You may have this screening every year starting at age 44 if you have a 30-pack-year history of smoking and currently smoke or have quit within the past 15 years.  Fecal occult blood test (FOBT) of the stool. You may have this test every year starting at age 28.  Flexible sigmoidoscopy or colonoscopy. You may have a sigmoidoscopy every 5 years or a colonoscopy every 10 years starting at age 64.  Prostate cancer screening. Recommendations will vary depending on your family history and other risks.  Hepatitis C blood test.  Hepatitis B blood test.  Sexually transmitted disease (STD) testing.  Diabetes screening. This is done by checking your blood sugar (glucose) after you have not eaten for a while (fasting). You may have this done every 1-3 years.  Abdominal aortic aneurysm (AAA) screening. You may need this if you are a current or former smoker.  Osteoporosis. You may be screened starting at age 70 if you are at high risk. Talk with your health care provider about your test results, treatment options, and if necessary, the need for more tests. Vaccines  Your health care provider may recommend certain vaccines, such as:  Influenza vaccine. This is recommended every year.  Tetanus,  diphtheria, and acellular pertussis (Tdap, Td) vaccine. You may need a Td booster every 10 years.  Zoster vaccine. You may need this after age 32.  Pneumococcal 13-valent conjugate (PCV13) vaccine. One dose is  recommended after age 83.  Pneumococcal polysaccharide (PPSV23) vaccine. One dose is recommended after age 62. Talk to your health care provider about which screenings and vaccines you need and how often you need them. This information is not intended to replace advice given to you by your health care provider. Make sure you discuss any questions you have with your health care provider. Document Released: 06/24/2015 Document Revised: 02/15/2016 Document Reviewed: 03/29/2015 Elsevier Interactive Patient Education  2017 [place] Prevention in the Home Falls can cause injuries. They can happen to people of all ages. There are many things you can do to make your home safe and to help prevent falls. What can I do on the outside of my home?  Regularly fix the edges of walkways and driveways and fix any cracks.  Remove anything that might make you trip as you walk through a door, such as a raised step or threshold.  Trim any bushes or trees on the path to your home.  Use bright outdoor lighting.  Clear any walking paths of anything that might make someone trip, such as rocks or tools.  Regularly check to see if handrails are loose or broken. Make sure that both sides of any steps have handrails.  Any raised decks and porches should have guardrails on the edges.  Have any leaves, snow, or ice cleared regularly.  Use sand or salt on walking paths during winter.  Clean up any spills in your garage right away. This includes oil or grease spills. What can I do in the bathroom?  Use night lights.  Install grab bars by the toilet and in the tub and shower. Do not use towel bars as grab bars.  Use non-skid mats or decals in the tub or shower.  If you need to sit down in the shower, use a plastic, non-slip stool.  Keep the floor dry. Clean up any water that spills on the floor as soon as it happens.  Remove soap buildup in the tub or shower regularly.  Attach bath mats  securely with double-sided non-slip rug tape.  Do not have throw rugs and other things on the floor that can make you trip. What can I do in the bedroom?  Use night lights.  Make sure that you have a light by your bed that is easy to reach.  Do not use any sheets or blankets that are too big for your bed. They should not hang down onto the floor.  Have a firm chair that has side arms. You can use this for support while you get dressed.  Do not have throw rugs and other things on the floor that can make you trip. What can I do in the kitchen?  Clean up any spills right away.  Avoid walking on wet floors.  Keep items that you use a lot in easy-to-reach places.  If you need to reach something above you, use a strong step stool that has a grab bar.  Keep electrical cords out of the way.  Do not use floor polish or wax that makes floors slippery. If you must use wax, use non-skid floor wax.  Do not have throw rugs and other things on the floor that can make you trip. What can I do with  my stairs?  Do not leave any items on the stairs.  Make sure that there are handrails on both sides of the stairs and use them. Fix handrails that are broken or loose. Make sure that handrails are as long as the stairways.  Check any carpeting to make sure that it is firmly attached to the stairs. Fix any carpet that is loose or worn.  Avoid having throw rugs at the top or bottom of the stairs. If you do have throw rugs, attach them to the floor with carpet tape.  Make sure that you have a light switch at the top of the stairs and the bottom of the stairs. If you do not have them, ask someone to add them for you. What else can I do to help prevent falls?  Wear shoes that:  Do not have high heels.  Have rubber bottoms.  Are comfortable and fit you well.  Are closed at the toe. Do not wear sandals.  If you use a stepladder:  Make sure that it is fully opened. Do not climb a closed  stepladder.  Make sure that both sides of the stepladder are locked into place.  Ask someone to hold it for you, if possible.  Clearly mark and make sure that you can see:  Any grab bars or handrails.  First and last steps.  Where the edge of each step is.  Use tools that help you move around (mobility aids) if they are needed. These include:  Canes.  Walkers.  Scooters.  Crutches.  Turn on the lights when you go into a dark area. Replace any light bulbs as soon as they burn out.  Set up your furniture so you have a clear path. Avoid moving your furniture around.  If any of your floors are uneven, fix them.  If there are any pets around you, be aware of where they are.  Review your medicines with your doctor. Some medicines can make you feel dizzy. This can increase your chance of falling. Ask your doctor what other things that you can do to help prevent falls. This information is not intended to replace advice given to you by your health care provider. Make sure you discuss any questions you have with your health care provider. Document Released: 03/24/2009 Document Revised: 11/03/2015 Document Reviewed: 07/02/2014 Elsevier Interactive Patient Education  2017 [employer].

## 2020-02-04 ENCOUNTER — Other Ambulatory Visit: Payer: Self-pay

## 2020-02-04 ENCOUNTER — Encounter: Payer: Self-pay | Admitting: Family Medicine

## 2020-02-04 ENCOUNTER — Ambulatory Visit (INDEPENDENT_AMBULATORY_CARE_PROVIDER_SITE_OTHER): Payer: Medicare Other | Admitting: Family Medicine

## 2020-02-04 VITALS — BP 123/79 | HR 66 | Temp 97.8°F | Wt 205.0 lb

## 2020-02-04 DIAGNOSIS — E1159 Type 2 diabetes mellitus with other circulatory complications: Secondary | ICD-10-CM | POA: Diagnosis not present

## 2020-02-04 DIAGNOSIS — E119 Type 2 diabetes mellitus without complications: Secondary | ICD-10-CM

## 2020-02-04 DIAGNOSIS — I10 Essential (primary) hypertension: Secondary | ICD-10-CM | POA: Diagnosis not present

## 2020-02-04 DIAGNOSIS — E782 Mixed hyperlipidemia: Secondary | ICD-10-CM

## 2020-02-04 DIAGNOSIS — E1169 Type 2 diabetes mellitus with other specified complication: Secondary | ICD-10-CM

## 2020-02-04 DIAGNOSIS — C7A8 Other malignant neuroendocrine tumors: Secondary | ICD-10-CM

## 2020-02-04 DIAGNOSIS — C7B8 Other secondary neuroendocrine tumors: Secondary | ICD-10-CM

## 2020-02-04 MED ORDER — METFORMIN HCL 500 MG PO TABS: 500.0000 mg | ORAL_TABLET | Freq: Every day | ORAL | 1 refills | Status: DC

## 2020-02-04 MED ORDER — DILTIAZEM HCL ER COATED BEADS 240 MG PO CP24: 240.0000 mg | ORAL_CAPSULE | Freq: Every day | ORAL | 1 refills | Status: DC

## 2020-02-04 NOTE — Assessment & Plan Note (Signed)
Recheck lipids, adjust as needed. Continue present medication and lifestyle regimen

## 2020-02-04 NOTE — Progress Notes (Signed)
BP 123/79   Pulse 66   Temp 97.8 F (36.6 C) (Oral)   Wt 205 lb (93 kg)   SpO2 97%   BMI 31.17 kg/m    Subjective:    Patient ID: Randy Harper, male    DOB: 06/14/1950, 69 y.o.   MRN: [id number]  HPI: Randy Harper is a 69 y.o. male  Chief Complaint  Patient presents with  . Diabetes  . Hypertension  . Hyperlipidemia   Here today for 6 month f/u chronic conditions.   DM - Does not check BS at home. On metformin once daily and tolerating well. Denies low blood sugar spells, polyuria, polydipsia, polyphagia. Eating healthy diet and staying very active.   HTN - Not checking home BPs. On cardizem regimen, DASH diet. Denies CP, SOB, HAs, dizziness.   HLD - Takes lipitor faithfully, no side effects. Denies myalgias, claudication.   The 10-year ASCVD risk score [name], et al., 2013) is: 33.1%   Values used to calculate the score:     Age: 53 years     Sex: Male     Is Non-Hispanic African American: No     Diabetic: Yes     Tobacco smoker: No     Systolic Blood Pressure: 101 mmHg     Is BP treated: Yes     HDL Cholesterol: 43 mg/dL     Total Cholesterol: 138 mg/dL  Relevant past medical, surgical, family and social history reviewed and updated as indicated. Interim medical history since our last visit reviewed. Allergies and medications reviewed and updated.  Review of Systems  Per HPI unless specifically indicated above     Objective:    BP 123/79   Pulse 66   Temp 97.8 F (36.6 C) (Oral)   Wt 205 lb (93 kg)   SpO2 97%   BMI 31.17 kg/m   Wt Readings from Last 3 Encounters:  02/04/20 205 lb (93 kg)  01/18/20 200 lb (90.7 kg)  08/07/19 224 lb (101.6 kg)    Physical Exam Vitals and nursing note reviewed.  Constitutional:      Appearance: Normal appearance.  HENT:     Head: Atraumatic.  Eyes:     Extraocular Movements: Extraocular movements intact.     Conjunctiva/sclera: Conjunctivae normal.  Cardiovascular:     Rate and Rhythm: Normal rate  and regular rhythm.  Pulmonary:     Effort: Pulmonary effort is normal.     Breath sounds: Normal breath sounds.  Musculoskeletal:        General: Normal range of motion.     Cervical back: Normal range of motion and neck supple.  Skin:    General: Skin is warm and dry.  Neurological:     General: No focal deficit present.     Mental Status: He is oriented to person, place, and time.  Psychiatric:        Mood and Affect: Mood normal.        Thought Content: Thought content normal.        Judgment: Judgment normal.     Results for orders placed or performed in visit on 08/07/19  HM DIABETES EYE EXAM  Result Value Ref Range   HM Diabetic Eye Exam No Retinopathy No Retinopathy      Assessment & Plan:   Problem List Items Addressed This Visit      Cardiovascular and Mediastinum   Hypertension associated with diabetes ([name]) - Primary    BPs stable  and well controlled, continue present medications, DASH diet.       Relevant Medications   metFORMIN (GLUCOPHAGE) 500 MG tablet   diltiazem (CARDIZEM CD) 240 MG 24 hr capsule   Other Relevant Orders   Comprehensive metabolic panel     Digestive   Neuroendocrine carcinoma metastatic to liver [name]    Following closely with Oncology, continue per their recommendations        Endocrine   Diabetes mellitus associated with hormonal etiology ([name])    Recheck A1C, adjust as needed. Continue present medication and lifestyle regimen      Relevant Medications   metFORMIN (GLUCOPHAGE) 500 MG tablet   Other Relevant Orders   HgB A1c     Other   Hyperlipidemia    Recheck lipids, adjust as needed. Continue present medication and lifestyle regimen      Relevant Medications   diltiazem (CARDIZEM CD) 240 MG 24 hr capsule   Other Relevant Orders   Lipid Panel w/o Chol/HDL Ratio    Other Visit Diagnoses    Diabetes mellitus without complication (HCC)       Relevant Medications   metFORMIN (GLUCOPHAGE) 500 MG tablet        Follow up plan: Return in about 6 months (around 08/06/2020) for CPE.

## 2020-02-04 NOTE — Assessment & Plan Note (Signed)
Recheck A1C, adjust as needed. Continue present medication and lifestyle regimen. 

## 2020-02-04 NOTE — Assessment & Plan Note (Signed)
BPs stable and well controlled, continue present medications, DASH diet.  

## 2020-02-04 NOTE — Assessment & Plan Note (Signed)
Following closely with Oncology, continue per their recommendations

## 2020-02-05 LAB — COMPREHENSIVE METABOLIC PANEL
ALT: 13 IU/L (ref 0–44)
AST: 18 IU/L (ref 0–40)
Albumin/Globulin Ratio: 1.9 (ref 1.2–2.2)
Albumin: 4.4 g/dL (ref 3.7–4.7)
Alkaline Phosphatase: 98 IU/L (ref 48–121)
BUN/Creatinine Ratio: 22 (ref 10–24)
BUN: 15 mg/dL (ref 8–27)
Bilirubin Total: 0.4 mg/dL (ref 0.0–1.2)
CO2: 26 mmol/L (ref 20–29)
Calcium: 9.2 mg/dL (ref 8.6–10.2)
Chloride: 102 mmol/L (ref 96–106)
Creatinine, Ser: 0.69 mg/dL — ABNORMAL LOW (ref 0.76–1.27)
GFR calc Af Amer: 110 mL/min/{1.73_m2} (ref >59)
GFR calc non Af Amer: 96 mL/min/{1.73_m2} (ref >59)
Globulin, Total: 2.3 g/dL (ref 1.5–4.5)
Glucose: 129 mg/dL — ABNORMAL HIGH (ref 65–99)
Potassium: 4.2 mmol/L (ref 3.5–5.2)
Sodium: 140 mmol/L (ref 134–144)
Total Protein: 6.7 g/dL (ref 6.0–8.5)

## 2020-02-05 LAB — LIPID PANEL W/O CHOL/HDL RATIO
Cholesterol, Total: 143 mg/dL (ref 100–199)
HDL: 44 mg/dL (ref >39)
LDL Chol Calc (NIH): 82 mg/dL (ref 0–99)
Triglycerides: 88 mg/dL (ref 0–149)
VLDL Cholesterol Cal: 17 mg/dL (ref 5–40)

## 2020-02-05 LAB — HEMOGLOBIN A1C
Est. average glucose Bld gHb Est-mCnc: 114 mg/dL
Hgb A1c MFr Bld: 5.6 % (ref 4.8–5.6)

## 2020-02-23 DIAGNOSIS — H43813 Vitreous degeneration, bilateral: Secondary | ICD-10-CM | POA: Diagnosis not present

## 2020-02-23 DIAGNOSIS — E119 Type 2 diabetes mellitus without complications: Secondary | ICD-10-CM | POA: Diagnosis not present

## 2020-02-23 DIAGNOSIS — H353112 Nonexudative age-related macular degeneration, right eye, intermediate dry stage: Secondary | ICD-10-CM | POA: Diagnosis not present

## 2020-02-23 DIAGNOSIS — H353221 Exudative age-related macular degeneration, left eye, with active choroidal neovascularization: Secondary | ICD-10-CM | POA: Diagnosis not present

## 2020-02-24 DIAGNOSIS — H2513 Age-related nuclear cataract, bilateral: Secondary | ICD-10-CM | POA: Diagnosis not present

## 2020-02-24 DIAGNOSIS — E119 Type 2 diabetes mellitus without complications: Secondary | ICD-10-CM | POA: Diagnosis not present

## 2020-02-24 DIAGNOSIS — H35712 Central serous chorioretinopathy, left eye: Secondary | ICD-10-CM | POA: Diagnosis not present

## 2020-02-24 DIAGNOSIS — H353221 Exudative age-related macular degeneration, left eye, with active choroidal neovascularization: Secondary | ICD-10-CM | POA: Diagnosis not present

## 2020-02-24 DIAGNOSIS — H40013 Open angle with borderline findings, low risk, bilateral: Secondary | ICD-10-CM | POA: Diagnosis not present

## 2020-02-24 LAB — HM DIABETES EYE EXAM

## 2020-04-12 DIAGNOSIS — H353221 Exudative age-related macular degeneration, left eye, with active choroidal neovascularization: Secondary | ICD-10-CM | POA: Diagnosis not present

## 2020-04-12 DIAGNOSIS — H353112 Nonexudative age-related macular degeneration, right eye, intermediate dry stage: Secondary | ICD-10-CM | POA: Diagnosis not present

## 2020-04-12 DIAGNOSIS — H43813 Vitreous degeneration, bilateral: Secondary | ICD-10-CM | POA: Diagnosis not present

## 2020-04-12 DIAGNOSIS — H43821 Vitreomacular adhesion, right eye: Secondary | ICD-10-CM | POA: Diagnosis not present

## 2020-04-26 DIAGNOSIS — C787 Secondary malignant neoplasm of liver and intrahepatic bile duct: Secondary | ICD-10-CM | POA: Diagnosis not present

## 2020-04-26 DIAGNOSIS — D3A8 Other benign neuroendocrine tumors: Secondary | ICD-10-CM | POA: Diagnosis not present

## 2020-04-26 DIAGNOSIS — Z8505 Personal history of malignant neoplasm of liver: Secondary | ICD-10-CM | POA: Diagnosis not present

## 2020-04-26 DIAGNOSIS — Z08 Encounter for follow-up examination after completed treatment for malignant neoplasm: Secondary | ICD-10-CM | POA: Diagnosis not present

## 2020-04-26 DIAGNOSIS — C7B8 Other secondary neuroendocrine tumors: Secondary | ICD-10-CM | POA: Diagnosis not present

## 2020-04-26 DIAGNOSIS — C7A8 Other malignant neuroendocrine tumors: Secondary | ICD-10-CM | POA: Diagnosis not present

## 2020-04-26 DIAGNOSIS — Z85068 Personal history of other malignant neoplasm of small intestine: Secondary | ICD-10-CM | POA: Diagnosis not present

## 2020-04-26 DIAGNOSIS — Z9049 Acquired absence of other specified parts of digestive tract: Secondary | ICD-10-CM | POA: Diagnosis not present

## 2020-05-31 ENCOUNTER — Other Ambulatory Visit: Payer: Self-pay | Admitting: Family Medicine

## 2020-05-31 DIAGNOSIS — H43392 Other vitreous opacities, left eye: Secondary | ICD-10-CM | POA: Diagnosis not present

## 2020-05-31 DIAGNOSIS — H353221 Exudative age-related macular degeneration, left eye, with active choroidal neovascularization: Secondary | ICD-10-CM | POA: Diagnosis not present

## 2020-05-31 DIAGNOSIS — H353112 Nonexudative age-related macular degeneration, right eye, intermediate dry stage: Secondary | ICD-10-CM | POA: Diagnosis not present

## 2020-05-31 DIAGNOSIS — H43813 Vitreous degeneration, bilateral: Secondary | ICD-10-CM | POA: Diagnosis not present

## 2020-05-31 NOTE — Telephone Encounter (Signed)
Pt has appt with Marnee Guarneri 08/08/20

## 2020-07-19 DIAGNOSIS — E119 Type 2 diabetes mellitus without complications: Secondary | ICD-10-CM | POA: Diagnosis not present

## 2020-07-19 DIAGNOSIS — H353221 Exudative age-related macular degeneration, left eye, with active choroidal neovascularization: Secondary | ICD-10-CM | POA: Diagnosis not present

## 2020-07-19 DIAGNOSIS — H353112 Nonexudative age-related macular degeneration, right eye, intermediate dry stage: Secondary | ICD-10-CM | POA: Diagnosis not present

## 2020-07-19 DIAGNOSIS — H43821 Vitreomacular adhesion, right eye: Secondary | ICD-10-CM | POA: Diagnosis not present

## 2020-08-02 ENCOUNTER — Other Ambulatory Visit: Payer: Self-pay | Admitting: Family Medicine

## 2020-08-02 DIAGNOSIS — E119 Type 2 diabetes mellitus without complications: Secondary | ICD-10-CM

## 2020-08-02 NOTE — Telephone Encounter (Signed)
Requested medications are due for refill today.  Yes  Requested medications are on the active medications list.  yes  Last refill. 02/04/2020  Future visit scheduled.   yes  Notes to clinic.  Has not established care with a new PCP. He has an appointment with Jolene in a few days.

## 2020-08-03 NOTE — Telephone Encounter (Signed)
Has CPE scheduled

## 2020-08-08 ENCOUNTER — Encounter: Payer: Self-pay | Admitting: Nurse Practitioner

## 2020-08-08 ENCOUNTER — Ambulatory Visit (INDEPENDENT_AMBULATORY_CARE_PROVIDER_SITE_OTHER): Payer: Medicare Other | Admitting: Nurse Practitioner

## 2020-08-08 ENCOUNTER — Other Ambulatory Visit: Payer: Self-pay

## 2020-08-08 VITALS — BP 130/80 | HR 73 | Temp 97.8°F | Ht 68.7 in | Wt 207.1 lb

## 2020-08-08 DIAGNOSIS — E1169 Type 2 diabetes mellitus with other specified complication: Secondary | ICD-10-CM

## 2020-08-08 DIAGNOSIS — I152 Hypertension secondary to endocrine disorders: Secondary | ICD-10-CM

## 2020-08-08 DIAGNOSIS — E782 Mixed hyperlipidemia: Secondary | ICD-10-CM

## 2020-08-08 DIAGNOSIS — H3530 Unspecified macular degeneration: Secondary | ICD-10-CM | POA: Diagnosis not present

## 2020-08-08 DIAGNOSIS — E785 Hyperlipidemia, unspecified: Secondary | ICD-10-CM | POA: Diagnosis not present

## 2020-08-08 DIAGNOSIS — I10 Essential (primary) hypertension: Secondary | ICD-10-CM | POA: Diagnosis not present

## 2020-08-08 DIAGNOSIS — C7A8 Other malignant neuroendocrine tumors: Secondary | ICD-10-CM | POA: Diagnosis not present

## 2020-08-08 DIAGNOSIS — E1159 Type 2 diabetes mellitus with other circulatory complications: Secondary | ICD-10-CM

## 2020-08-08 DIAGNOSIS — Z0000 Encounter for general adult medical examination without abnormal findings: Secondary | ICD-10-CM | POA: Diagnosis not present

## 2020-08-08 DIAGNOSIS — C7B8 Other secondary neuroendocrine tumors: Secondary | ICD-10-CM

## 2020-08-08 LAB — URINALYSIS, ROUTINE W REFLEX MICROSCOPIC
Bilirubin, UA: NEGATIVE
Glucose, UA: NEGATIVE
Ketones, UA: NEGATIVE
Leukocytes,UA: NEGATIVE
Nitrite, UA: NEGATIVE
Protein,UA: NEGATIVE
RBC, UA: NEGATIVE
Specific Gravity, UA: 1.015 (ref 1.005–1.030)
Urobilinogen, Ur: 4 mg/dL — ABNORMAL HIGH (ref 0.2–1.0)
pH, UA: 7 (ref 5.0–7.5)

## 2020-08-08 MED ORDER — DILTIAZEM HCL ER COATED BEADS 240 MG PO CP24
240.0000 mg | ORAL_CAPSULE | Freq: Every day | ORAL | 1 refills | Status: DC
Start: 2020-08-08 — End: 2021-01-31

## 2020-08-08 MED ORDER — ATORVASTATIN CALCIUM 80 MG PO TABS: 80.0000 mg | ORAL_TABLET | Freq: Every day | ORAL | 1 refills | Status: DC

## 2020-08-08 NOTE — Assessment & Plan Note (Addendum)
Stable.  Ongoing.  Increased Atorvastatin from 20mg  to 80mg .  Recheck lipid today.

## 2020-08-08 NOTE — Assessment & Plan Note (Signed)
Recheck A1C, adjust as needed. Continue present medication and lifestyle regimen.

## 2020-08-08 NOTE — Progress Notes (Signed)
BP 130/80   Pulse 73   Temp 97.8 F (36.6 C)   Ht 5' 8.7" (1.745 m)   Wt 207 lb 2 oz (94 kg)   SpO2 97%   BMI 30.85 kg/m    Subjective:    Patient ID: Randy Harper, male    DOB: 1950-07-08, 70 y.o.   MRN: [id number]  HPI: Randy Harper is a 70 y.o. male presenting on 08/08/2020 for comprehensive medical examination. Current medical complaints include:none  He currently lives with: Interim Problems from his last visit: no  DIABETES Hypoglycemic episodes:no Polydipsia/polyuria: no Visual disturbance: no Chest pain: no Paresthesias: no Glucose Monitoring: no  Accucheck frequency: Not Checking  Fasting glucose:  Post prandial:  Evening:  Before meals: Taking Insulin?: no  Long acting insulin:  Short acting insulin: Blood Pressure Monitoring: not checking Retinal Examination: Up to Date Foot Exam: Up to Date Diabetic Education: Not Completed Pneumovax: Up to Date Influenza: Up to Date Aspirin: no  HYPERTENSION / [place] Satisfied with current treatment? yes Duration of hypertension: years BP monitoring frequency: not checking BP range:  BP medication side effects: no Past BP meds: diltiazem Duration of hyperlipidemia: years Cholesterol medication side effects: no Cholesterol supplements: none Past cholesterol medications: atorvastain (lipitor) Medication compliance: excellent compliance Aspirin: no Recent stressors: no Recurrent headaches: no Visual changes: no Palpitations: no Dyspnea: no Chest pain: no Lower extremity edema: no Dizzy/lightheaded: no   The 10-year ASCVD risk score [name] DC Jr., et al., 2013) is: 36.1%   Values used to calculate the score:     Age: 31 years     Sex: Male     Is Non-Hispanic African American: No     Diabetic: Yes     Tobacco smoker: No     Systolic Blood Pressure: 540 mmHg     Is BP treated: Yes     HDL Cholesterol: 44 mg/dL     Total Cholesterol: 143 mg/dL    FALL RISK: Fall Risk  01/18/2020 01/14/2019  06/30/2018 12/23/2017 06/18/2017  Falls in the past year? 0 0 0 No No  Number falls in past yr: - - - - -  Injury with Fall? - - - - -  Comment - - - - -  Risk for fall due to : Medication side effect - - - -  Follow up Falls evaluation completed;Education provided;Falls prevention discussed - Falls evaluation completed - -    Depression Screen Depression screen [facility] 2/9 01/18/2020 01/14/2019 12/23/2017 06/18/2017 02/19/2017  Decreased Interest 0 0 0 0 0  Down, Depressed, Hopeless 0 0 0 0 0  PHQ - 2 Score 0 0 0 0 0    Advanced Directives <no information>  Past Medical History:  Past Medical History:  Diagnosis Date  . Blood transfusion without reported diagnosis 01/2017  . Cancer ([place]) 01/2017   metastic neuroendocrine - liver  . Diabetes mellitus without complication ([place])   . Dysrhythmia    supraventricular Tach  . Glaucoma 2015  . Hyperlipidemia   . Hypertension   . Muscle strain of chest wall   . PE (pulmonary thromboembolism) ([place])     Surgical History:  Past Surgical History:  Procedure Laterality Date  . CHOLECYSTECTOMY  01/2017   related to liver cancer  . COLONOSCOPY    . COLONOSCOPY WITH PROPOFOL N/A 03/17/2019   Procedure: COLONOSCOPY WITH PROPOFOL;  Surgeon: Lollie Sails, MD;  Location: [facility] ENDOSCOPY;  Service: Endoscopy;  Laterality: N/A;  . LAPAROSCOPY ABDOMEN DIAGNOSTIC    .  resection liver total right lobe  12/2016  . SMALL INTESTINE SURGERY  01/2017   cancer surgery  . TENDON GRAFT Right    right thumb tendon graft at age 51     Medications:  Current Outpatient Medications on File Prior to Visit  Medication Sig  . Aflibercept 2 MG/0.05ML SOLN Apply to eye. Every 6 weeks injection  . brimonidine (ALPHAGAN) 0.2 % ophthalmic solution Place 1 drop into the left eye daily.   . metFORMIN (GLUCOPHAGE) 500 MG tablet TAKE 1 TABLET BY MOUTH  DAILY WITH BREAKFAST  . Multiple Vitamin (MULTIVITAMIN) tablet Take 1 tablet by mouth daily.   No current  facility-administered medications on file prior to visit.    Allergies:  No Known Allergies  Social History:  Social History   Socioeconomic History  . Marital status: Married    Spouse name: Not on file  . Number of children: Not on file  . Years of education: Not on file  . Highest education level: Master's degree (e.g., MA, MS, MEng, MEd, MSW, MBA)  Occupational History  . Occupation: retired  Tobacco Use  . Smoking status: Never Smoker  . Smokeless tobacco: Never Used  Vaping Use  . Vaping Use: Never used  Substance and Sexual Activity  . Alcohol use: No  . Drug use: No  . Sexual activity: Yes    Birth control/protection: None  Other Topics Concern  . Not on file  Social History Narrative  . Not on file   Social Determinants of Health   Financial Resource Strain: Low Risk   . Difficulty of Paying Living Expenses: Not hard at all  Food Insecurity: No Food Insecurity  . Worried About Charity fundraiser in the Last Year: Never true  . Ran Out of Food in the Last Year: Never true  Transportation Needs: No Transportation Needs  . Lack of Transportation (Medical): No  . Lack of Transportation (Non-Medical): No  Physical Activity: Insufficiently Active  . Days of Exercise per Week: 5 days  . Minutes of Exercise per Session: 20 min  Stress: No Stress Concern Present  . Feeling of Stress : Not at all  Social Connections: Not on file  Intimate Partner Violence: Not on file   Social History   Tobacco Use  Smoking Status Never Smoker  Smokeless Tobacco Never Used   Social History   Substance and Sexual Activity  Alcohol Use No    Family History:  Family History  Problem Relation Age of Onset  . Heart disease Father   . Diabetes Maternal Uncle   . Diabetes Maternal Uncle   . Diabetes Maternal Uncle   . Diabetes Maternal Uncle   . Diabetes Maternal Grandmother     Past medical history, surgical history, medications, allergies, family history and social  history reviewed with patient today and changes made to appropriate areas of the chart.   Review of Systems - Negative. All other ROS negative except what is listed above and in the HPI.      Objective:    BP 130/80   Pulse 73   Temp 97.8 F (36.6 C)   Ht 5' 8.7" (1.745 m)   Wt 207 lb 2 oz (94 kg)   SpO2 97%   BMI 30.85 kg/m   Wt Readings from Last 3 Encounters:  08/08/20 207 lb 2 oz (94 kg)  02/04/20 205 lb (93 kg)  01/18/20 200 lb (90.7 kg)    Physical Exam Vitals and nursing note reviewed.  Constitutional:      General: He is not in acute distress.    Appearance: Normal appearance. He is not ill-appearing, toxic-appearing or diaphoretic.  HENT:     Head: Normocephalic.     Right Ear: Tympanic membrane, ear canal and external ear normal.     Left Ear: Tympanic membrane, ear canal and external ear normal.     Nose: Nose normal. No congestion or rhinorrhea.     Mouth/Throat:     Mouth: Mucous membranes are moist.  Eyes:     General:        Right eye: No discharge.        Left eye: No discharge.     Extraocular Movements: Extraocular movements intact.     Conjunctiva/sclera: Conjunctivae normal.     Pupils: Pupils are equal, round, and reactive to light.  Cardiovascular:     Rate and Rhythm: Normal rate and regular rhythm.     Heart sounds: No murmur heard.   Pulmonary:     Effort: Pulmonary effort is normal. No respiratory distress.     Breath sounds: Normal breath sounds. No wheezing, rhonchi or rales.  Abdominal:     General: Abdomen is flat. Bowel sounds are normal. There is no distension.     Palpations: Abdomen is soft.     Tenderness: There is no abdominal tenderness. There is no guarding.  Musculoskeletal:     Cervical back: Normal range of motion and neck supple.  Skin:    General: Skin is warm and dry.     Capillary Refill: Capillary refill takes less than 2 seconds.  Neurological:     General: No focal deficit present.     Mental Status: He is  alert and oriented to person, place, and time.     Cranial Nerves: No cranial nerve deficit.     Motor: No weakness.     Deep Tendon Reflexes: Reflexes normal.  Psychiatric:        Mood and Affect: Mood normal.        Behavior: Behavior normal.        Thought Content: Thought content normal.        Judgment: Judgment normal.      Results for orders placed or performed in visit on 02/04/20  Comprehensive metabolic panel  Result Value Ref Range   Glucose 129 (H) 65 - 99 mg/dL   BUN 15 8 - 27 mg/dL   Creatinine, Ser 0.69 (L) 0.76 - 1.27 mg/dL   GFR calc non Af Amer 96 >59 mL/min/1.73   GFR calc Af Amer 110 >59 mL/min/1.73   BUN/Creatinine Ratio 22 10 - 24   Sodium 140 134 - 144 mmol/L   Potassium 4.2 3.5 - 5.2 mmol/L   Chloride 102 96 - 106 mmol/L   CO2 26 20 - 29 mmol/L   Calcium 9.2 8.6 - 10.2 mg/dL   Total Protein 6.7 6.0 - 8.5 g/dL   Albumin 4.4 3.7 - 4.7 g/dL   Globulin, Total 2.3 1.5 - 4.5 g/dL   Albumin/Globulin Ratio 1.9 1.2 - 2.2   Bilirubin Total 0.4 0.0 - 1.2 mg/dL   Alkaline Phosphatase 98 48 - 121 IU/L   AST 18 0 - 40 IU/L   ALT 13 0 - 44 IU/L  Lipid Panel w/o Chol/HDL Ratio  Result Value Ref Range   Cholesterol, Total 143 100 - 199 mg/dL   Triglycerides 88 0 - 149 mg/dL   HDL 44 >39 mg/dL   VLDL Cholesterol  Cal 17 5 - 40 mg/dL   LDL Chol Calc (NIH) 82 0 - 99 mg/dL  HgB A1c  Result Value Ref Range   Hgb A1c MFr Bld 5.6 4.8 - 5.6 %   Est. average glucose Bld gHb Est-mCnc 114 mg/dL      Assessment & Plan:   Problem List Items Addressed This Visit      Cardiovascular and Mediastinum   Hypertension associated with diabetes ([place]) - Primary    BPs stable and well controlled, continue present medications, DASH diet.       Relevant Medications   diltiazem (CARDIZEM CD) 240 MG 24 hr capsule   atorvastatin (LIPITOR) 80 MG tablet   Other Relevant Orders   Ambulatory referral to Ophthalmology   Microalbumin, Urine Waived     Digestive   RESOLVED:  Neuroendocrine carcinoma metastatic to liver [facility])     Endocrine   Diabetes mellitus associated with hormonal etiology ([place])    Recheck A1C, adjust as needed. Continue present medication and lifestyle regimen.      Relevant Medications   atorvastatin (LIPITOR) 80 MG tablet     Other   Hyperlipidemia    Stable.  Ongoing.  Increased Atorvastatin from 20mg  to 80mg .  Recheck lipid today.        Relevant Medications   diltiazem (CARDIZEM CD) 240 MG 24 hr capsule   atorvastatin (LIPITOR) 80 MG tablet   Macular degeneration disease    Stable.  Continues to follow up with specialist.        Other Visit Diagnoses    Annual physical exam       Relevant Orders   Comprehensive metabolic panel   Lipid Profile   HgB A1c   Urinalysis, Routine w reflex microscopic   TSH   CBC   PSA       Preventative Services:  Colon Cancer Screening: Up to Date- Next one is due 2025 Depression Screening: Done Today Diabetes Screening: Done Today  Glaucoma Screening: Ordered Today Hepatitis C screening: Up to Date HIV Screening: Up to Date Flu Vaccine: Lung cancer Screening: NA Obesity Screening: Done Today Pneumonia Vaccines (2): Up to Date PSA screening: Done Today  Discussed aspirin prophylaxis for myocardial infarction prevention and decision was it was not indicated  LABORATORY TESTING:  Health maintenance labs ordered today as discussed above.   The natural history of prostate cancer and ongoing controversy regarding screening and potential treatment outcomes of prostate cancer has been discussed with the patient. The meaning of a false positive PSA and a false negative PSA has been discussed. He indicates understanding of the limitations of this screening test and wishes to proceed with screening PSA testing.   IMMUNIZATIONS:   - Tdap: Tetanus vaccination status reviewed: last tetanus booster within 10 years. - Influenza: Up to date - Pneumovax: Up to date - Prevnar: Up to date -  Zostavax vaccine: Discussed at visit today.  SCREENING: - Colonoscopy: Up to date  Discussed with patient purpose of the colonoscopy is to detect colon cancer at curable precancerous or early stages    PATIENT COUNSELING:    Sexuality: Discussed sexually transmitted diseases, partner selection, use of condoms, avoidance of unintended pregnancy  and contraceptive alternatives.   Advised to avoid cigarette smoking.  I discussed with the patient that most people either abstain from alcohol or drink within safe limits (<=14/week and <=4 drinks/occasion for males, <=7/weeks and <= 3 drinks/occasion for females) and that the risk for alcohol disorders and other health  effects rises proportionally with the number of drinks per week and how often a drinker exceeds daily limits.  Discussed cessation/primary prevention of drug use and availability of treatment for abuse.   Diet: Encouraged to adjust caloric intake to maintain  or achieve ideal body weight, to reduce intake of dietary saturated fat and total fat, to limit sodium intake by avoiding high sodium foods and not adding table salt, and to maintain adequate dietary potassium and calcium preferably from fresh fruits, vegetables, and low-fat dairy products.    stressed the importance of regular exercise  Injury prevention: Discussed safety belts, safety helmets, smoke detector, smoking near bedding or upholstery.   Dental health: Discussed importance of regular tooth brushing, flossing, and dental visits.   Follow up plan: NEXT PREVENTATIVE PHYSICAL DUE IN 1 YEAR. Return in about 6 months (around 02/05/2021) for HTN, HLD, DM2 FU.

## 2020-08-08 NOTE — Assessment & Plan Note (Signed)
BPs stable and well controlled, continue present medications, DASH diet.

## 2020-08-08 NOTE — Progress Notes (Signed)
Hi Randy Harper.  It was a pleasure meeting you this morning.  Your Urinalysis is consistent with prior.  We will continue to monitor this over time.  Please let me know if you have any questions.

## 2020-08-08 NOTE — Assessment & Plan Note (Signed)
Stable.  Continues to follow up with specialist.

## 2020-08-09 DIAGNOSIS — I4892 Unspecified atrial flutter: Secondary | ICD-10-CM | POA: Diagnosis not present

## 2020-08-09 DIAGNOSIS — I10 Essential (primary) hypertension: Secondary | ICD-10-CM | POA: Diagnosis not present

## 2020-08-09 DIAGNOSIS — I700 Atherosclerosis of aorta: Secondary | ICD-10-CM | POA: Diagnosis not present

## 2020-08-09 DIAGNOSIS — E782 Mixed hyperlipidemia: Secondary | ICD-10-CM | POA: Diagnosis not present

## 2020-08-09 LAB — LIPID PANEL
Chol/HDL Ratio: 3.9 ratio (ref 0.0–5.0)
Cholesterol, Total: 170 mg/dL (ref 100–199)
HDL: 44 mg/dL (ref >39)
LDL Chol Calc (NIH): 103 mg/dL — ABNORMAL HIGH (ref 0–99)
Triglycerides: 129 mg/dL (ref 0–149)
VLDL Cholesterol Cal: 23 mg/dL (ref 5–40)

## 2020-08-09 LAB — CBC
Hematocrit: 43.7 % (ref 37.5–51.0)
Hemoglobin: 14.7 g/dL (ref 13.0–17.7)
MCH: 30.6 pg (ref 26.6–33.0)
MCHC: 33.6 g/dL (ref 31.5–35.7)
MCV: 91 fL (ref 79–97)
Platelets: 174 10*3/uL (ref 150–450)
RBC: 4.81 x10E6/uL (ref 4.14–5.80)
RDW: 11.8 % (ref 11.6–15.4)
WBC: 4.2 10*3/uL (ref 3.4–10.8)

## 2020-08-09 LAB — HEMOGLOBIN A1C
Est. average glucose Bld gHb Est-mCnc: 114 mg/dL
Hgb A1c MFr Bld: 5.6 % (ref 4.8–5.6)

## 2020-08-09 LAB — PSA: Prostate Specific Ag, Serum: 0.8 ng/mL (ref 0.0–4.0)

## 2020-08-09 LAB — TSH: TSH: 1.29 u[IU]/mL (ref 0.450–4.500)

## 2020-08-09 NOTE — Progress Notes (Signed)
Hi Randy Harper.  It was a pleasure meeting you yesterday.  Your lab work looks great.  Keep up the good work.  Continue with your current medication regimen.  Follow up as we discussed in our visit.  I look forward to seeing you then.

## 2020-08-23 DIAGNOSIS — I4892 Unspecified atrial flutter: Secondary | ICD-10-CM | POA: Diagnosis not present

## 2020-08-23 DIAGNOSIS — I700 Atherosclerosis of aorta: Secondary | ICD-10-CM | POA: Diagnosis not present

## 2020-08-25 DIAGNOSIS — I4892 Unspecified atrial flutter: Secondary | ICD-10-CM | POA: Diagnosis not present

## 2020-09-06 DIAGNOSIS — H353112 Nonexudative age-related macular degeneration, right eye, intermediate dry stage: Secondary | ICD-10-CM | POA: Diagnosis not present

## 2020-09-06 DIAGNOSIS — H353221 Exudative age-related macular degeneration, left eye, with active choroidal neovascularization: Secondary | ICD-10-CM | POA: Diagnosis not present

## 2020-09-06 DIAGNOSIS — H3581 Retinal edema: Secondary | ICD-10-CM | POA: Diagnosis not present

## 2020-10-04 DIAGNOSIS — I4892 Unspecified atrial flutter: Secondary | ICD-10-CM | POA: Diagnosis not present

## 2020-10-04 DIAGNOSIS — I700 Atherosclerosis of aorta: Secondary | ICD-10-CM | POA: Diagnosis not present

## 2020-10-06 DIAGNOSIS — I10 Essential (primary) hypertension: Secondary | ICD-10-CM | POA: Diagnosis not present

## 2020-10-06 DIAGNOSIS — E119 Type 2 diabetes mellitus without complications: Secondary | ICD-10-CM | POA: Diagnosis not present

## 2020-10-06 DIAGNOSIS — I4892 Unspecified atrial flutter: Secondary | ICD-10-CM | POA: Diagnosis not present

## 2020-10-06 DIAGNOSIS — E782 Mixed hyperlipidemia: Secondary | ICD-10-CM | POA: Diagnosis not present

## 2020-10-06 DIAGNOSIS — I700 Atherosclerosis of aorta: Secondary | ICD-10-CM | POA: Diagnosis not present

## 2020-10-18 DIAGNOSIS — C7A8 Other malignant neuroendocrine tumors: Secondary | ICD-10-CM | POA: Diagnosis not present

## 2020-10-18 DIAGNOSIS — D3502 Benign neoplasm of left adrenal gland: Secondary | ICD-10-CM | POA: Diagnosis not present

## 2020-10-18 DIAGNOSIS — Z86012 Personal history of benign carcinoid tumor: Secondary | ICD-10-CM | POA: Diagnosis not present

## 2020-10-18 DIAGNOSIS — C7B8 Other secondary neuroendocrine tumors: Secondary | ICD-10-CM | POA: Diagnosis not present

## 2020-10-26 DIAGNOSIS — H43821 Vitreomacular adhesion, right eye: Secondary | ICD-10-CM | POA: Diagnosis not present

## 2020-10-26 DIAGNOSIS — H353112 Nonexudative age-related macular degeneration, right eye, intermediate dry stage: Secondary | ICD-10-CM | POA: Diagnosis not present

## 2020-10-26 DIAGNOSIS — H43813 Vitreous degeneration, bilateral: Secondary | ICD-10-CM | POA: Diagnosis not present

## 2020-10-26 DIAGNOSIS — H353221 Exudative age-related macular degeneration, left eye, with active choroidal neovascularization: Secondary | ICD-10-CM | POA: Diagnosis not present

## 2020-11-22 DIAGNOSIS — Z20822 Contact with and (suspected) exposure to covid-19: Secondary | ICD-10-CM | POA: Diagnosis not present

## 2020-12-02 DIAGNOSIS — Z20822 Contact with and (suspected) exposure to covid-19: Secondary | ICD-10-CM | POA: Diagnosis not present

## 2020-12-23 DIAGNOSIS — H43813 Vitreous degeneration, bilateral: Secondary | ICD-10-CM | POA: Diagnosis not present

## 2020-12-23 DIAGNOSIS — H353221 Exudative age-related macular degeneration, left eye, with active choroidal neovascularization: Secondary | ICD-10-CM | POA: Diagnosis not present

## 2020-12-23 DIAGNOSIS — H43821 Vitreomacular adhesion, right eye: Secondary | ICD-10-CM | POA: Diagnosis not present

## 2020-12-23 DIAGNOSIS — H353112 Nonexudative age-related macular degeneration, right eye, intermediate dry stage: Secondary | ICD-10-CM | POA: Diagnosis not present

## 2020-12-25 ENCOUNTER — Other Ambulatory Visit: Payer: Self-pay | Admitting: Nurse Practitioner

## 2020-12-25 DIAGNOSIS — E782 Mixed hyperlipidemia: Secondary | ICD-10-CM

## 2020-12-25 NOTE — Telephone Encounter (Signed)
last RF 08/08/20 #90 1 RF///too soon

## 2021-01-18 ENCOUNTER — Ambulatory Visit: Payer: Medicare Other

## 2021-01-19 ENCOUNTER — Other Ambulatory Visit: Payer: Self-pay | Admitting: Nurse Practitioner

## 2021-01-19 DIAGNOSIS — E119 Type 2 diabetes mellitus without complications: Secondary | ICD-10-CM

## 2021-01-19 NOTE — Telephone Encounter (Signed)
Requested Prescriptions  Pending Prescriptions Disp Refills  . metFORMIN (GLUCOPHAGE) 500 MG tablet [Pharmacy Med Name: metFORMIN HCl 500 MG Oral Tablet] 90 tablet 0    Sig: TAKE 1 TABLET BY MOUTH  DAILY WITH BREAKFAST     Endocrinology:  Diabetes - Biguanides Failed - 01/19/2021 12:04 AM      Failed - Cr in normal range and within 360 days    Creatinine, Ser  Date Value Ref Range Status  02/04/2020 0.69 (L) 0.76 - 1.27 mg/dL Final         Passed - HBA1C is between 0 and 7.9 and within 180 days    Hemoglobin A1C  Date Value Ref Range Status  01/19/2016 6.3  Final   HB A1C (BAYER DCA - WAIVED)  Date Value Ref Range Status  01/14/2019 5.6 <7.0 % Final    Comment:                                          Diabetic Adult            <7.0                                       Healthy Adult        4.3 - 5.7                                                           (DCCT/NGSP) American Diabetes Association's Summary of Glycemic Recommendations for Adults with Diabetes: Hemoglobin A1c <7.0%. More stringent glycemic goals (A1c <6.0%) may further reduce complications at the cost of increased risk of hypoglycemia.    Hgb A1c MFr Bld  Date Value Ref Range Status  08/08/2020 5.6 4.8 - 5.6 % Final    Comment:             Prediabetes: 5.7 - 6.4          Diabetes: >6.4          Glycemic control for adults with diabetes: <7.0          Passed - eGFR in normal range and within 360 days    GFR calc Af Amer  Date Value Ref Range Status  02/04/2020 110 >59 mL/min/1.73 Final    Comment:    **[facility] currently reports eGFR in compliance with the current**   recommendations of the [employer]. [facility] will   update reporting as new guidelines are published from the NKF-ASN   Task force.    GFR calc non Af Amer  Date Value Ref Range Status  02/04/2020 96 >59 mL/min/1.73 Final         Passed - Valid encounter within last 6 months    Recent Outpatient Visits          5  months ago Annual physical exam   [place], NP   11 months ago Hypertension associated with diabetes [employer])   [facility] [place], [place]   1 year ago Annual physical exam   [place], [place]   1 year ago Benign  essential HTN   [facility] [place], Jeannette How, MD   2 years ago Essential hypertension   [place], Jeannette How, MD      Future Appointments            Tomorrow  [facility], [place]   In 2 weeks Jon Billings, NP [facility], [place]

## 2021-01-20 ENCOUNTER — Ambulatory Visit (INDEPENDENT_AMBULATORY_CARE_PROVIDER_SITE_OTHER): Payer: Medicare Other

## 2021-01-20 VITALS — Ht 68.0 in | Wt 210.0 lb

## 2021-01-20 DIAGNOSIS — Z0000 Encounter for general adult medical examination without abnormal findings: Secondary | ICD-10-CM

## 2021-01-20 NOTE — Patient Instructions (Signed)
[name] , Thank you for taking time to come for your Medicare Wellness Visit. I appreciate your ongoing commitment to your health goals. Please review the following plan we discussed and let me know if I can assist you in the future.   Screening recommendations/referrals: Colonoscopy: completed 03/17/2019, due 03/16/2024 Recommended yearly ophthalmology/optometry visit for glaucoma screening and checkup Recommended yearly dental visit for hygiene and checkup  Vaccinations: Influenza vaccine: due Pneumococcal vaccine: completed 01/19/2016 Tdap vaccine: completed 06/30/2018, due 06/30/2028 Shingles vaccine: completed   Covid-19:  03/11/2020, 07/23/2019, 07/02/2019  Advanced directives: Please bring a copy of your POA (Power of Attorney) and/or Living Will to your next appointment.   Conditions/risks identified: none  Next appointment: Follow up in one year for your annual wellness visit.   Preventive Care 17 Years and Older, Male Preventive care refers to lifestyle choices and visits with your health care provider that can promote health and wellness. What does preventive care include? A yearly physical exam. This is also called an annual well check. Dental exams once or twice a year. Routine eye exams. Ask your health care provider how often you should have your eyes checked. Personal lifestyle choices, including: Daily care of your teeth and gums. Regular physical activity. Eating a healthy diet. Avoiding tobacco and drug use. Limiting alcohol use. Practicing safe sex. Taking low doses of aspirin every day. Taking vitamin and mineral supplements as recommended by your health care provider. What happens during an annual well check? The services and screenings done by your health care provider during your annual well check will depend on your age, overall health, lifestyle risk factors, and family history of disease. Counseling  Your health care provider may ask you questions about  your: Alcohol use. Tobacco use. Drug use. Emotional well-being. Home and relationship well-being. Sexual activity. Eating habits. History of falls. Memory and ability to understand (cognition). Work and work Statistician. Screening  You may have the following tests or measurements: Height, weight, and BMI. Blood pressure. Lipid and cholesterol levels. These may be checked every 5 years, or more frequently if you are over 56 years Randy Harper. Skin check. Lung cancer screening. You may have this screening every year starting at age 61 if you have a 30-pack-year history of smoking and currently smoke or have quit within the past 15 years. Fecal occult blood test (FOBT) of the stool. You may have this test every year starting at age 71. Flexible sigmoidoscopy or colonoscopy. You may have a sigmoidoscopy every 5 years or a colonoscopy every 10 years starting at age 24. Prostate cancer screening. Recommendations will vary depending on your family history and other risks. Hepatitis C blood test. Hepatitis B blood test. Sexually transmitted disease (STD) testing. Diabetes screening. This is done by checking your blood sugar (glucose) after you have not eaten for a while (fasting). You may have this done every 1-3 years. Abdominal aortic aneurysm (AAA) screening. You may need this if you are a current or former smoker. Osteoporosis. You may be screened starting at age 67 if you are at high risk. Talk with your health care provider about your test results, treatment options, and if necessary, the need for more tests. Vaccines  Your health care provider may recommend certain vaccines, such as: Influenza vaccine. This is recommended every year. Tetanus, diphtheria, and acellular pertussis (Tdap, Td) vaccine. You may need a Td booster every 10 years. Zoster vaccine. You may need this after age 43. Pneumococcal 13-valent conjugate (PCV13) vaccine. One dose is  recommended after age 66. Pneumococcal  polysaccharide (PPSV23) vaccine. One dose is recommended after age 43. Talk to your health care provider about which screenings and vaccines you need and how often you need them. This information is not intended to replace advice given to you by your health care provider. Make sure you discuss any questions you have with your health care provider. Document Released: 06/24/2015 Document Revised: 02/15/2016 Document Reviewed: 03/29/2015 Elsevier Interactive Patient Education  2017 [place] Prevention in the Home Falls can cause injuries. They can happen to people of all ages. There are many things you can do to make your home safe and to help prevent falls. What can I do on the outside of my home? Regularly fix the edges of walkways and driveways and fix any cracks. Remove anything that might make you trip as you walk through a door, such as a raised step or threshold. Trim any bushes or trees on the path to your home. Use bright outdoor lighting. Clear any walking paths of anything that might make someone trip, such as rocks or tools. Regularly check to see if handrails are loose or broken. Make sure that both sides of any steps have handrails. Any raised decks and porches should have guardrails on the edges. Have any leaves, snow, or ice cleared regularly. Use sand or salt on walking paths during winter. Clean up any spills in your garage right away. This includes oil or grease spills. What can I do in the bathroom? Use night lights. Install grab bars by the toilet and in the tub and shower. Do not use towel bars as grab bars. Use non-skid mats or decals in the tub or shower. If you need to sit down in the shower, use a plastic, non-slip stool. Keep the floor dry. Clean up any water that spills on the floor as soon as it happens. Remove soap buildup in the tub or shower regularly. Attach bath mats securely with double-sided non-slip rug tape. Do not have throw rugs and other  things on the floor that can make you trip. What can I do in the bedroom? Use night lights. Make sure that you have a light by your bed that is easy to reach. Do not use any sheets or blankets that are too big for your bed. They should not hang down onto the floor. Have a firm chair that has side arms. You can use this for support while you get dressed. Do not have throw rugs and other things on the floor that can make you trip. What can I do in the kitchen? Clean up any spills right away. Avoid walking on wet floors. Keep items that you use a lot in easy-to-reach places. If you need to reach something above you, use a strong step stool that has a grab bar. Keep electrical cords out of the way. Do not use floor polish or wax that makes floors slippery. If you must use wax, use non-skid floor wax. Do not have throw rugs and other things on the floor that can make you trip. What can I do with my stairs? Do not leave any items on the stairs. Make sure that there are handrails on both sides of the stairs and use them. Fix handrails that are broken or loose. Make sure that handrails are as long as the stairways. Check any carpeting to make sure that it is firmly attached to the stairs. Fix any carpet that is loose or worn. Avoid having  throw rugs at the top or bottom of the stairs. If you do have throw rugs, attach them to the floor with carpet tape. Make sure that you have a light switch at the top of the stairs and the bottom of the stairs. If you do not have them, ask someone to add them for you. What else can I do to help prevent falls? Wear shoes that: Do not have high heels. Have rubber bottoms. Are comfortable and fit you well. Are closed at the toe. Do not wear sandals. If you use a stepladder: Make sure that it is fully opened. Do not climb a closed stepladder. Make sure that both sides of the stepladder are locked into place. Ask someone to hold it for you, if possible. Clearly  mark and make sure that you can see: Any grab bars or handrails. First and last steps. Where the edge of each step is. Use tools that help you move around (mobility aids) if they are needed. These include: Canes. Walkers. Scooters. Crutches. Turn on the lights when you go into a dark area. Replace any light bulbs as soon as they burn out. Set up your furniture so you have a clear path. Avoid moving your furniture around. If any of your floors are uneven, fix them. If there are any pets around you, be aware of where they are. Review your medicines with your doctor. Some medicines can make you feel dizzy. This can increase your chance of falling. Ask your doctor what other things that you can do to help prevent falls. This information is not intended to replace advice given to you by your health care provider. Make sure you discuss any questions you have with your health care provider. Document Released: 03/24/2009 Document Revised: 11/03/2015 Document Reviewed: 07/02/2014 Elsevier Interactive Patient Education  2017 [employer].

## 2021-01-20 NOTE — Progress Notes (Signed)
I connected with [name] today by telephone and verified that I am speaking with the correct person using two identifiers. Location patient: home Location provider: work Persons participating in the virtual visit: Randy, [place] LPN.   I discussed the limitations, risks, security and privacy concerns of performing an evaluation and management service by telephone and the availability of in person appointments. I also discussed with the patient that there may be a patient responsible charge related to this service. The patient expressed understanding and verbally consented to this telephonic visit.    Interactive audio and video telecommunications were attempted between this provider and patient, however failed, due to patient having technical difficulties OR patient did not have access to video capability.  We continued and completed visit with audio only.     Vital signs may be patient reported or missing.  Subjective:   Randy Harper is a 70 y.o. male who presents for Medicare Annual/Subsequent preventive examination.  Review of Systems     Cardiac Risk Factors include: advanced age (>82mn, >>71women);diabetes mellitus;dyslipidemia;hypertension;male gender;obesity (BMI >30kg/m2)     Objective:    Today's Vitals   01/20/21 0941  Weight: 210 lb (95.3 kg)  Height: '5\' 8"'$  (1.727 m)   Body mass index is 31.93 kg/m.  Advanced Directives 01/20/2021 01/18/2020 03/17/2019 01/14/2019 12/23/2017 12/19/2016  Does Patient Have a Medical Advance Directive? Yes Yes Yes Yes Yes Yes  Type of AParamedicof AMontroseLiving will [place]Living will Living will Living will;Healthcare Power of ADonnellyLiving will  Copy of [place] Chart? No - copy requested No - copy requested - No - copy requested No - copy requested No - copy requested    Current  Medications (verified) Outpatient Encounter Medications as of 01/20/2021  Medication Sig   Aflibercept 2 MG/0.05ML SOLN Apply to eye. Every 6 weeks injection   atorvastatin (LIPITOR) 80 MG tablet Take 1 tablet (80 mg total) by mouth daily.   brimonidine (ALPHAGAN) 0.2 % ophthalmic solution Place 1 drop into the left eye daily.    diltiazem (CARDIZEM CD) 240 MG 24 hr capsule Take 1 capsule (240 mg total) by mouth daily.   metFORMIN (GLUCOPHAGE) 500 MG tablet TAKE 1 TABLET BY MOUTH  DAILY WITH BREAKFAST   Multiple Vitamin (MULTIVITAMIN) tablet Take 1 tablet by mouth daily.   No facility-administered encounter medications on file as of 01/20/2021.    Allergies (verified) Patient has no known allergies.   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   Diabetes mellitus without complication ([facility]    Dysrhythmia    supraventricular Tach   Glaucoma 2015   Hyperlipidemia    Hypertension    Muscle strain of chest wall    [place]    Past Surgical History:  Procedure Laterality Date   CHOLECYSTECTOMY  01/2017   related to liver cancer   COLONOSCOPY     COLONOSCOPY WITH PROPOFOL N/A 03/17/2019   Procedure: COLONOSCOPY WITH PROPOFOL;  Surgeon: SLollie Sails MD;  [facility];  Service: Endoscopy;  Laterality: N/A;   LAPAROSCOPY ABDOMEN DIAGNOSTIC     resection liver total right lobe  12/2016   SMALL INTESTINE SURGERY  01/2017   cancer surgery   TENDON GRAFT Right    right thumb tendon graft  at age 57    Family History  Problem Relation Age of Onset   Heart disease Father    Diabetes Maternal Uncle    Diabetes Maternal Uncle    Diabetes Maternal Uncle    Diabetes Maternal Uncle    Diabetes Maternal Grandmother    Social History   Socioeconomic History   Marital status: Married    Spouse name: Not on file   Number of  children: Not on file   Years of education: Not on file   Highest education level: Master's degree (e.g., MA, MS, MEng, MEd, MSW, MBA)  Occupational History   Occupation: retired  Tobacco Use   Smoking status: Never   Smokeless tobacco: Never  Vaping Use   Vaping Use: Never used  Substance and Sexual Activity   Alcohol use: No   Drug use: No   Sexual activity: Yes    Birth control/protection: None  Other Topics Concern   Not on file  Social History Narrative   Not on file   Social Determinants of Health   Financial Resource Strain: Low Risk    Difficulty of Paying Living Expenses: Not hard at all  Food Insecurity: No Food Insecurity   Worried About Charity fundraiser in the Last Year: Never true   [place] in the Last Year: Never true  Transportation Needs: No Transportation Needs   Lack of Transportation (Medical): No   Lack of Transportation (Non-Medical): No  Physical Activity: Sufficiently Active   Days of Exercise per Week: 6 days   Minutes of Exercise per Session: 30 min  Stress: No Stress Concern Present   Feeling of Stress : Not at all  Social Connections: Not on file    Tobacco Counseling Counseling given: Not Answered   Clinical Intake:  Pre-visit preparation completed: Yes  Pain : No/denies pain     Nutritional Status: BMI > 30  Obese Nutritional Risks: None Diabetes: Yes  How often do you need to have someone help you when you read instructions, pamphlets, or other written materials from your doctor or pharmacy?: 1 - Never What is the last grade level you completed in school?: masters degree  Diabetic? Yes Nutrition Risk Assessment:  Has the patient had any N/V/D within the last 2 months?  No  Does the patient have any non-healing wounds?  No  Has the patient had any unintentional weight loss or weight gain?  No   Diabetes:  Is the patient diabetic?  Yes  If diabetic, was a CBG obtained today?  No  Did the patient bring in their  glucometer from home?  No  How often do you monitor your CBG's? Does not check.   Financial Strains and Diabetes Management:  Are you having any financial strains with the device, your supplies or your medication? No .  Does the patient want to be seen by Chronic Care Management for management of their diabetes?  No  Would the patient like to be referred to a Nutritionist or for Diabetic Management?  No   Diabetic Exams:  Diabetic Eye Exam: Completed 02/24/2020 Diabetic Foot Exam: Completed 08/08/2020   Interpreter Needed?: No  Information entered by :: NAllen LPN   Activities of Daily Living In your present state of health, do you have any difficulty performing the following activities: 01/20/2021  Hearing? N  Vision? N  Difficulty concentrating or making decisions? N  Walking or climbing stairs? N  Dressing or bathing? N  Doing errands, shopping?  N  Preparing Food and eating ? N  Using the Toilet? N  In the past six months, have you accidently leaked urine? N  Do you have problems with loss of bowel control? N  Managing your Medications? N  Managing your Finances? N  Housekeeping or managing your Housekeeping? N  Some recent data might be hidden    Patient Care Team: Jon Billings, NP as PCP - General Anell Barr, OD (Optometry) Earnestine Leys, MD (Specialist) Barbette Merino, NP as Nurse Practitioner (Nurse Practitioner) Corey Skains, MD as Consulting Physician (Cardiology) Blanca Friend, MD as Referring Physician (Oncology)  Indicate any recent Medical Services you may have received from other than [facility] providers in the past year (date may be approximate).     Assessment:   This is a routine wellness examination for [facility].  Hearing/Vision screen Vision Screening - Comments:: Regular eye exams, [place]  Dietary issues and exercise activities discussed: Current Exercise Habits: Home exercise routine, Type of exercise: walking,  Time (Minutes): 30, Frequency (Times/Week): 6, Weekly Exercise (Minutes/Week): 180   Goals Addressed             This Visit's Progress    Patient Stated       01/20/2021, maintain weight or reduce some       Depression Screen PHQ 2/9 Scores 01/20/2021 01/18/2020 01/14/2019 12/23/2017 06/18/2017 02/19/2017 12/19/2016  PHQ - 2 Score 0 0 0 0 0 0 0    Fall Risk Fall Risk  01/20/2021 01/18/2020 01/14/2019 06/30/2018 12/23/2017  Falls in the past year? 0 0 0 0 No  Number falls in past yr: - - - - -  Injury with Fall? - - - - -  Comment - - - - -  Risk for fall due to : Medication side effect Medication side effect - - -  Follow up Falls evaluation completed;Education provided;Falls prevention discussed Falls evaluation completed;Education provided;Falls prevention discussed - Falls evaluation completed -    FALL RISK PREVENTION PERTAINING TO THE HOME:  Any stairs in or around the home? Yes  If so, are there any without handrails? No  Home free of loose throw rugs in walkways, pet beds, electrical cords, etc? Yes  Adequate lighting in your home to reduce risk of falls? Yes   ASSISTIVE DEVICES UTILIZED TO PREVENT FALLS:  Life alert? No  Use of a cane, walker or w/c? No  Grab bars in the bathroom? Yes  Shower chair or bench in shower? Yes  Elevated toilet seat or a handicapped toilet? Yes   TIMED UP AND GO:  Was the test performed? No .      Cognitive Function:     6CIT Screen 01/20/2021 01/18/2020 01/14/2019 12/23/2017 12/19/2016  What Year? 0 points 0 points 0 points 0 points 0 points  What month? 0 points 0 points 0 points 0 points 0 points  What time? 0 points 3 points 0 points 0 points 0 points  Count back from 20 0 points 0 points 0 points 0 points 0 points  Months in reverse 0 points 0 points 0 points 0 points 0 points  Repeat phrase 2 points 0 points 0 points 0 points 2 points  Total Score 2 3 0 0 2    Immunizations Immunization History  Administered Date(s) Administered    Fluad Quad(high Dose 65+) 03/12/2019   Influenza, High Dose Seasonal PF 02/19/2017, 03/05/2018, 04/11/2020   Influenza,inj,Quad PF,6+ Mos 03/15/2015   Influenza-Unspecified 04/11/2016, 03/12/2019  PFIZER(Purple Top)SARS-COV-2 Vaccination 07/02/2019, 07/23/2019, 03/11/2020   Pneumococcal Conjugate-13 02/03/2014   Pneumococcal Polysaccharide-23 01/19/2016   Pneumococcal-Unspecified 11/29/2010   Td 04/20/2008, 06/30/2018   Zoster Recombinat (Shingrix) 12/09/2017, 06/24/2018   Zoster, Live 05/30/2009    TDAP status: Up to date  Flu Vaccine status: Due, Education has been provided regarding the importance of this vaccine. Advised may receive this vaccine at local pharmacy or Health Dept. Aware to provide a copy of the vaccination record if obtained from local pharmacy or Health Dept. Verbalized acceptance and understanding.  Pneumococcal vaccine status: Up to date  Covid-19 vaccine status: Completed vaccines  Qualifies for Shingles Vaccine? Yes   Zostavax completed Yes   Shingrix Completed?: Yes  Screening Tests Health Maintenance  Topic Date Due   COVID-19 Vaccine (4 - Booster for Pfizer series) 06/11/2020   INFLUENZA VACCINE  01/09/2021   HEMOGLOBIN A1C  02/05/2021   OPHTHALMOLOGY EXAM  02/23/2021   FOOT EXAM  08/08/2021   URINE MICROALBUMIN  08/08/2021   COLONOSCOPY (Pts 45-78yr Insurance coverage will need to be confirmed)  03/16/2024   TETANUS/TDAP  06/30/2028   Hepatitis C Screening  Completed   PNA vac Low Risk Adult  Completed   Zoster Vaccines- Shingrix  Completed   HPV VACCINES  Aged Out    Health Maintenance  Health Maintenance Due  Topic Date Due   COVID-19 Vaccine (4 - Booster for Pfizer series) 06/11/2020   INFLUENZA VACCINE  01/09/2021    Colorectal cancer screening: Type of screening: Colonoscopy. Completed 03/17/2019. Repeat every 5 years  Lung Cancer Screening: (Low Dose CT Chest recommended if Age 72-80years, 30 pack-year currently smoking OR have  quit w/in 15years.) does not qualify.   Lung Cancer Screening Referral: no  Additional Screening:  Hepatitis C Screening: does qualify; Completed 06/15/2015  Vision Screening: Recommended annual ophthalmology exams for early detection of glaucoma and other disorders of the eye. Is the patient up to date with their annual eye exam?  Yes  Who is the provider or what is the name of the [facility] which the patient attends annual [facility] exams[facility] provider, would they like to be referred to a provider to establish care? No .   Dental Screening: Recommended annual dental exams for proper oral hygiene  Community Resource Referral / Chronic Care Management: CRR required this visit?  No   CCM required this visit?  No      Plan:     I have personally reviewed and noted the following in the patient's chart:   Medical and social history Use of alcohol, tobacco or illicit drugs  Current medications and supplements including opioid prescriptions. Patient is not currently taking opioid prescriptions. Functional ability and status Nutritional status Physical activity Advanced directives List of other physicians Hospitalizations, surgeries, and ER visits in previous 12 months Vitals Screenings to include cognitive, depression, and falls Referrals and appointments  In addition, I have reviewed and discussed with patient certain preventive protocols, quality metrics, and best practice recommendations. A written personalized care plan for preventive services as well as general preventive health recommendations were provided to patient.     NKellie Simmering LPN   [id number]  Nurse Notes:

## 2021-01-30 DIAGNOSIS — D2271 Melanocytic nevi of right lower limb, including hip: Secondary | ICD-10-CM | POA: Diagnosis not present

## 2021-01-30 DIAGNOSIS — L821 Other seborrheic keratosis: Secondary | ICD-10-CM | POA: Diagnosis not present

## 2021-01-30 DIAGNOSIS — D225 Melanocytic nevi of trunk: Secondary | ICD-10-CM | POA: Diagnosis not present

## 2021-01-30 DIAGNOSIS — D2262 Melanocytic nevi of left upper limb, including shoulder: Secondary | ICD-10-CM | POA: Diagnosis not present

## 2021-01-30 DIAGNOSIS — D2261 Melanocytic nevi of right upper limb, including shoulder: Secondary | ICD-10-CM | POA: Diagnosis not present

## 2021-01-30 DIAGNOSIS — D2272 Melanocytic nevi of left lower limb, including hip: Secondary | ICD-10-CM | POA: Diagnosis not present

## 2021-01-31 ENCOUNTER — Other Ambulatory Visit: Payer: Self-pay | Admitting: Nurse Practitioner

## 2021-01-31 DIAGNOSIS — I152 Hypertension secondary to endocrine disorders: Secondary | ICD-10-CM

## 2021-01-31 NOTE — Telephone Encounter (Signed)
Requested Prescriptions  Pending Prescriptions Disp Refills  . diltiazem (CARDIZEM CD) 240 MG 24 hr capsule [Pharmacy Med Name: DILTIAZEM  '240MG'$   CAP  24 HR CD] 90 capsule 0    Sig: TAKE 1 CAPSULE BY MOUTH  DAILY     Cardiovascular:  Calcium Channel Blockers Passed - 01/31/2021 11:54 PM      Passed - Last BP in normal range    BP Readings from Last 1 Encounters:  08/08/20 130/80         Passed - Valid encounter within last 6 months    Recent Outpatient Visits          5 months ago Annual physical exam   [facility] Jon Billings, NP   12 months ago Hypertension associated with diabetes [facility])   [facility] [place], [place]   1 year ago Annual physical exam   [place], [place]   2 years ago Benign essential HTN   [facility] [place], Jeannette How, MD   2 years ago Essential hypertension   [place], Jeannette How, MD      Future Appointments            In 1 week Jon Billings, NP [place], [place]   In 78 months  [employer], [place]

## 2021-02-07 ENCOUNTER — Other Ambulatory Visit: Payer: Self-pay

## 2021-02-07 ENCOUNTER — Encounter: Payer: Self-pay | Admitting: Nurse Practitioner

## 2021-02-07 ENCOUNTER — Ambulatory Visit (INDEPENDENT_AMBULATORY_CARE_PROVIDER_SITE_OTHER): Payer: Medicare Other | Admitting: Nurse Practitioner

## 2021-02-07 VITALS — BP 121/69 | HR 75 | Temp 98.1°F | Ht 69.0 in | Wt 214.0 lb

## 2021-02-07 DIAGNOSIS — I152 Hypertension secondary to endocrine disorders: Secondary | ICD-10-CM

## 2021-02-07 DIAGNOSIS — E782 Mixed hyperlipidemia: Secondary | ICD-10-CM

## 2021-02-07 DIAGNOSIS — E1169 Type 2 diabetes mellitus with other specified complication: Secondary | ICD-10-CM

## 2021-02-07 DIAGNOSIS — E1159 Type 2 diabetes mellitus with other circulatory complications: Secondary | ICD-10-CM

## 2021-02-07 MED ORDER — DILTIAZEM HCL ER COATED BEADS 240 MG PO CP24: 240.0000 mg | ORAL_CAPSULE | Freq: Every day | ORAL | 1 refills | Status: DC

## 2021-02-07 MED ORDER — ATORVASTATIN CALCIUM 80 MG PO TABS: 80.0000 mg | ORAL_TABLET | Freq: Every day | ORAL | 1 refills | Status: DC

## 2021-02-07 MED ORDER — METFORMIN HCL 500 MG PO TABS: 500.0000 mg | ORAL_TABLET | Freq: Every day | ORAL | 1 refills | Status: DC

## 2021-02-07 NOTE — Progress Notes (Signed)
BP 121/69   Pulse 75   Temp 98.1 F (36.7 C)   Ht 5' 9"  (1.753 m)   Wt 214 lb (97.1 kg)   SpO2 95%   BMI 31.60 kg/m    Subjective:    Patient ID: Randy Harper, male    DOB: 1951-01-08, 70 y.o.   MRN: [id number]  HPI: Randy Harper is a 70 y.o. male  Chief Complaint  Patient presents with   Hyperlipidemia   Hypertension   Diabetes   HYPERTENSION / HYPERLIPIDEMIA Satisfied with current treatment? no Duration of hypertension: years BP monitoring frequency: not checking BP range:  BP medication side effects: no Past BP meds:  Cardizem Duration of hyperlipidemia: years Cholesterol medication side effects: no Cholesterol supplements: none Past cholesterol medications: atorvastain (lipitor) Medication compliance: excellent compliance Aspirin: no Recent stressors: no Recurrent headaches: no Visual changes: no Palpitations: no Dyspnea: no Chest pain: no Lower extremity edema: no Dizzy/lightheaded: no  DIABETES Hypoglycemic episodes:no Polydipsia/polyuria: no Visual disturbance:  does have macular degeneration.  On 10 week schedule for therapy. Chest pain: no Paresthesias: no Glucose Monitoring: no  Accucheck frequency: Not Checking  Fasting glucose:  Post prandial:  Evening:  Before meals: Taking Insulin?: no  Long acting insulin:  Short acting insulin: Blood Pressure Monitoring: not checking Retinal Examination: Up to Date Foot Exam: Up to Date Diabetic Education: Not Completed Pneumovax: Up to Date Influenza: Not up to Date Aspirin: no  Relevant past medical, surgical, family and social history reviewed and updated as indicated. Interim medical history since our last visit reviewed. Allergies and medications reviewed and updated.  Review of Systems  Eyes:  Negative for visual disturbance.  Respiratory:  Negative for chest tightness and shortness of breath.   Cardiovascular:  Negative for chest pain, palpitations and leg swelling.  Endocrine:  Negative for polydipsia and polyuria.  Neurological:  Negative for dizziness, light-headedness, numbness and headaches.   Per HPI unless specifically indicated above     Objective:    BP 121/69   Pulse 75   Temp 98.1 F (36.7 C)   Ht 5' 9"  (1.753 m)   Wt 214 lb (97.1 kg)   SpO2 95%   BMI 31.60 kg/m   Wt Readings from Last 3 Encounters:  02/07/21 214 lb (97.1 kg)  01/20/21 210 lb (95.3 kg)  08/08/20 207 lb 2 oz (94 kg)    Physical Exam Vitals and nursing note reviewed.  Constitutional:      General: He is not in acute distress.    Appearance: Normal appearance. He is not ill-appearing, toxic-appearing or diaphoretic.  HENT:     Head: Normocephalic.     Right Ear: External ear normal.     Left Ear: External ear normal.     Nose: Nose normal. No congestion or rhinorrhea.     Mouth/Throat:     Mouth: Mucous membranes are moist.  Eyes:     General:        Right eye: No discharge.        Left eye: No discharge.     Extraocular Movements: Extraocular movements intact.     Conjunctiva/sclera: Conjunctivae normal.     Pupils: Pupils are equal, round, and reactive to light.  Cardiovascular:     Rate and Rhythm: Normal rate and regular rhythm.     Heart sounds: No murmur heard. Pulmonary:     Effort: Pulmonary effort is normal. No respiratory distress.     Breath sounds: Normal breath sounds.  No wheezing, rhonchi or rales.  Abdominal:     General: Abdomen is flat. Bowel sounds are normal.  Musculoskeletal:     Cervical back: Normal range of motion and neck supple.  Skin:    General: Skin is warm and dry.     Capillary Refill: Capillary refill takes less than 2 seconds.  Neurological:     General: No focal deficit present.     Mental Status: He is alert and oriented to person, place, and time.  Psychiatric:        Mood and Affect: Mood normal.        Behavior: Behavior normal.        Thought Content: Thought content normal.        Judgment: Judgment normal.     Results for orders placed or performed in visit on 08/10/20  HM DIABETES EYE EXAM  Result Value Ref Range   HM Diabetic Eye Exam No Retinopathy No Retinopathy      Assessment & Plan:   Problem List Items Addressed This Visit       Cardiovascular and Mediastinum   Hypertension associated with diabetes ([place])    Chronic.  Controlled.  Continue with current medication regimen of Cardizem 263m daily.  Labs ordered today.  Refills sent.  Return to clinic in 6 months for reevaluation.  Call sooner if concerns arise.        Relevant Medications   atorvastatin (LIPITOR) 80 MG tablet   diltiazem (CARDIZEM CD) 240 MG 24 hr capsule   metFORMIN (GLUCOPHAGE) 500 MG tablet   Other Relevant Orders   Comp Met (CMET)     Endocrine   Diabetes mellitus associated with hormonal etiology [place]    Chronic.  Controlled.  Continue with current medication regimen with Metoformin 5052mdaily.  Labs ordered today.  Refills sent today. Return to clinic in 6 months for reevaluation.  Call sooner if concerns arise.        Relevant Medications   atorvastatin (LIPITOR) 80 MG tablet   metFORMIN (GLUCOPHAGE) 500 MG tablet   Other Relevant Orders   HgB A1c     Other   Hyperlipidemia    Chronic.  Controlled.  Continue with current medication regimen atorvastatin 8038m Labs ordered today.  Refills sent today.  Return to clinic in 6 months for reevaluation.  Call sooner if concerns arise.        Relevant Medications   atorvastatin (LIPITOR) 80 MG tablet   diltiazem (CARDIZEM CD) 240 MG 24 hr capsule   Other Relevant Orders   Lipid Profile     Follow up plan: Return in about 6 months (around 08/08/2021) for Physical and Fasting labs.

## 2021-02-07 NOTE — Assessment & Plan Note (Signed)
Chronic.  Controlled.  Continue with current medication regimen with Metoformin '500mg'$  daily.  Labs ordered today.  Refills sent today. Return to clinic in 6 months for reevaluation.  Call sooner if concerns arise.

## 2021-02-07 NOTE — Assessment & Plan Note (Signed)
Chronic.  Controlled.  Continue with current medication regimen atorvastatin '80mg'$ .  Labs ordered today.  Refills sent today.  Return to clinic in 6 months for reevaluation.  Call sooner if concerns arise.

## 2021-02-07 NOTE — Assessment & Plan Note (Signed)
Chronic.  Controlled.  Continue with current medication regimen of Cardizem '240mg'$  daily.  Labs ordered today.  Refills sent.  Return to clinic in 6 months for reevaluation.  Call sooner if concerns arise.

## 2021-02-11 LAB — LIPID PANEL
Chol/HDL Ratio: 3.5 ratio (ref 0.0–5.0)
Cholesterol, Total: 138 mg/dL (ref 100–199)
HDL: 39 mg/dL — ABNORMAL LOW (ref >39)
LDL Chol Calc (NIH): 75 mg/dL (ref 0–99)
Triglycerides: 138 mg/dL (ref 0–149)
VLDL Cholesterol Cal: 24 mg/dL (ref 5–40)

## 2021-02-11 LAB — COMPREHENSIVE METABOLIC PANEL
ALT: 18 IU/L (ref 0–44)
AST: 19 IU/L (ref 0–40)
Albumin/Globulin Ratio: 1.7 (ref 1.2–2.2)
Albumin: 4.1 g/dL (ref 3.7–4.7)
Alkaline Phosphatase: 93 IU/L (ref 44–121)
BUN/Creatinine Ratio: 13 (ref 10–24)
BUN: 10 mg/dL (ref 8–27)
Bilirubin Total: <0.2 mg/dL (ref 0.0–1.2)
CO2: 21 mmol/L (ref 20–29)
Calcium: 9 mg/dL (ref 8.6–10.2)
Chloride: 99 mmol/L (ref 96–106)
Creatinine, Ser: 0.8 mg/dL (ref 0.76–1.27)
Globulin, Total: 2.4 g/dL (ref 1.5–4.5)
Glucose: 184 mg/dL — ABNORMAL HIGH (ref 65–99)
Potassium: 4 mmol/L (ref 3.5–5.2)
Sodium: 137 mmol/L (ref 134–144)
Total Protein: 6.5 g/dL (ref 6.0–8.5)
eGFR: 94 mL/min/{1.73_m2} (ref >59)

## 2021-02-11 LAB — HEMOGLOBIN A1C
Est. average glucose Bld gHb Est-mCnc: 120 mg/dL
Hgb A1c MFr Bld: 5.8 % — ABNORMAL HIGH (ref 4.8–5.6)

## 2021-02-14 NOTE — Progress Notes (Signed)
Hi Randy Harper.  Your lab wokr from last week looks good.  Your kidney function looks good.  Your cholesterol is well controlled.  Your A1c did increase slightly from 5.6 to 5.8 putting you in the prediabetic range.  I recommend you decrease your carbohydrate intake and we will recheck it at your next visit.  See you soon!

## 2021-03-01 DIAGNOSIS — E119 Type 2 diabetes mellitus without complications: Secondary | ICD-10-CM | POA: Diagnosis not present

## 2021-03-01 DIAGNOSIS — H353221 Exudative age-related macular degeneration, left eye, with active choroidal neovascularization: Secondary | ICD-10-CM | POA: Diagnosis not present

## 2021-03-01 DIAGNOSIS — H2513 Age-related nuclear cataract, bilateral: Secondary | ICD-10-CM | POA: Diagnosis not present

## 2021-03-01 DIAGNOSIS — H40013 Open angle with borderline findings, low risk, bilateral: Secondary | ICD-10-CM | POA: Diagnosis not present

## 2021-03-01 DIAGNOSIS — H35712 Central serous chorioretinopathy, left eye: Secondary | ICD-10-CM | POA: Diagnosis not present

## 2021-03-01 LAB — HM DIABETES EYE EXAM

## 2021-03-02 ENCOUNTER — Encounter: Payer: Self-pay | Admitting: Nurse Practitioner

## 2021-03-02 ENCOUNTER — Other Ambulatory Visit: Payer: Self-pay

## 2021-03-02 ENCOUNTER — Ambulatory Visit: Payer: Self-pay

## 2021-03-02 ENCOUNTER — Ambulatory Visit (INDEPENDENT_AMBULATORY_CARE_PROVIDER_SITE_OTHER): Payer: Medicare Other | Admitting: Nurse Practitioner

## 2021-03-02 ENCOUNTER — Ambulatory Visit
Admission: RE | Admit: 2021-03-02 | Discharge: 2021-03-02 | Disposition: A | Discharge status: Home / Self Care | Payer: Medicare Other | Source: Ambulatory Visit | Attending: Nurse Practitioner | Admitting: Nurse Practitioner

## 2021-03-02 VITALS — BP 139/89 | HR 79 | Resp 16 | Ht 68.0 in | Wt 213.8 lb

## 2021-03-02 DIAGNOSIS — Z0389 Encounter for observation for other suspected diseases and conditions ruled out: Secondary | ICD-10-CM | POA: Diagnosis not present

## 2021-03-02 DIAGNOSIS — M79661 Pain in right lower leg: Secondary | ICD-10-CM | POA: Insufficient documentation

## 2021-03-02 NOTE — Addendum Note (Signed)
Addended by: Vance Peper A on: 03/02/2021 03:59 PM   Modules accepted: Orders

## 2021-03-02 NOTE — Assessment & Plan Note (Signed)
Acute calf pain x8 days. Worse with walking, improves with rest and elevation. Differentials include DVT, PAD, muscle strain. Will check ultrasound to rule out DVT. Referral placed to vascular. He can continue taking ibuprofen prn, using ice intermittently. Follow up in 4 weeks.

## 2021-03-02 NOTE — Patient Instructions (Signed)
Out patient imaging center at [place] [facility] @ 1pm today

## 2021-03-02 NOTE — Telephone Encounter (Signed)
Pt. Reports he started having muscle cramps to right calf 1 week ago. Seems to be getting worse. No known injury. Appointment today.   Answer Assessment - Initial Assessment Questions 1. ONSET: "When did the pain start?"       1 week ago 2. LOCATION: "Where is the pain located?"      Right 3. PAIN: "How bad is the pain?"    (Scale 1-10; or mild, moderate, severe)   -  MILD (1-3): doesn't interfere with normal activities    -  MODERATE (4-7): interferes with normal activities (e.g., work or school) or awakens from sleep, limping    -  SEVERE (8-10): excruciating pain, unable to do any normal activities, unable to walk     5 4. WORK OR EXERCISE: "Has there been any recent work or exercise that involved this part of the body?"      No 5. CAUSE: "What do you think is causing the leg pain?"     Cramping 6. OTHER SYMPTOMS: "Do you have any other symptoms?" (e.g., chest pain, back pain, breathing difficulty, swelling, rash, fever, numbness, weakness)     No 7. PREGNANCY: "Is there any chance you are pregnant?" "When was your last menstrual period?"     N/a  Protocols used: Leg Pain-A-AH

## 2021-03-02 NOTE — Progress Notes (Signed)
Acute Office Visit  Subjective:    Patient ID: Randy Harper, male    DOB: 12-27-1950, 70 y.o.   MRN: [id number]  Chief Complaint  Patient presents with   Leg Pain    Started about 8 days ago, got better, then started back hurting pretty bad yesterday. He has pain when he stands or walks. Pain is shooting.     HPI Patient is in today for right  calf pain for that started initially about 8 days ago. He states it  started while he was on a walk that he normally takes around the neighborhood. It was hurting for a few days and then got better. [place] said that he did a lot of walking and had some pain, but not as bad. Then on Monday, the pain worsened again. He states he gets a sharp pain to his right calf while walking and then the pain gets better after he sits and elevates his leg. He states that his calf sometimes feels warm. The pain gets worse as the day goes on. The pain does not radiate. He denies shortness of breath and chest pain. He is concerned for circulation problems since he has diabetes. He has tried ibuprofen which helps with the pain.    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   Diabetes mellitus without complication ([place])    Dysrhythmia    supraventricular Tach   Glaucoma 2015   Hyperlipidemia    Hypertension    Muscle strain of chest wall    PE (pulmonary thromboembolism) ([place])     Past Surgical History:  Procedure Laterality Date   CHOLECYSTECTOMY  01/2017   related to liver cancer   COLONOSCOPY     COLONOSCOPY WITH PROPOFOL N/A 03/17/2019   Procedure: COLONOSCOPY WITH PROPOFOL;  Surgeon: Lollie Sails, MD;  Location: [facility] ENDOSCOPY;  Service: Endoscopy;  Laterality: N/A;   LAPAROSCOPY ABDOMEN DIAGNOSTIC     resection liver total right lobe  12/2016   SMALL INTESTINE SURGERY  01/2017   cancer surgery   TENDON GRAFT Right     right thumb tendon graft at age 70     Family History  Problem Relation Age of Onset   Heart disease Father    Diabetes Maternal Uncle    Diabetes Maternal Uncle    Diabetes Maternal Uncle    Diabetes Maternal Uncle    Diabetes Maternal Grandmother     Social History   Socioeconomic History   Marital status: Married    Spouse name: Not on file   Number of children: Not on file   Years of education: Not on file   Highest education level: Master's degree (e.g., MA, MS, MEng, MEd, MSW, MBA)  Occupational History   Occupation: retired  Tobacco Use   Smoking status: Never   Smokeless tobacco: Never  Vaping Use   Vaping Use: Never used  Substance and Sexual Activity   Alcohol use: No   Drug use: No   Sexual activity: Yes    Birth control/protection: None  Other Topics Concern   Not on file  Social History Narrative   Not on file   Social Determinants of Health   Financial Resource Strain: Low Risk    Difficulty of Paying Living Expenses: Not hard at all  Food Insecurity: No Food Insecurity   Worried About Running Out of  Food in the Last Year: Never true   [place] in the Last Year: Never true  Transportation Needs: No Transportation Needs   Lack of Transportation (Medical): No   Lack of Transportation (Non-Medical): No  Physical Activity: Sufficiently Active   Days of Exercise per Week: 6 days   Minutes of Exercise per Session: 30 min  Stress: No Stress Concern Present   Feeling of Stress : Not at all  Social Connections: Not on file  Intimate Partner Violence: Not on file    Outpatient Medications Prior to Visit  Medication Sig Dispense Refill   Aflibercept 2 MG/0.05ML SOLN Apply to eye. Every 6 weeks injection     atorvastatin (LIPITOR) 80 MG tablet Take 1 tablet (80 mg total) by mouth daily. 90 tablet 1   brimonidine (ALPHAGAN) 0.2 % ophthalmic solution Place 1 drop into the left eye daily.   3   diltiazem (CARDIZEM CD) 240 MG 24 hr capsule Take 1  capsule (240 mg total) by mouth daily. 90 capsule 1   metFORMIN (GLUCOPHAGE) 500 MG tablet Take 1 tablet (500 mg total) by mouth daily with breakfast. 90 tablet 1   Multiple Vitamin (MULTIVITAMIN) tablet Take 1 tablet by mouth daily.     No facility-administered medications prior to visit.    No Known Allergies  Review of Systems  Constitutional: Negative.   Respiratory: Negative.    Gastrointestinal: Negative.   Musculoskeletal:        Right calf pain  Skin: Negative.   Neurological: Negative.       Objective:    Physical Exam Vitals and nursing note reviewed.  Constitutional:      Appearance: Normal appearance.  HENT:     Head: Normocephalic.  Eyes:     Conjunctiva/sclera: Conjunctivae normal.  Cardiovascular:     Rate and Rhythm: Normal rate.     Pulses: Normal pulses.  Pulmonary:     Effort: Pulmonary effort is normal.  Musculoskeletal:        General: Tenderness (slight tenderness to right calf with palpation) present.     Cervical back: Normal range of motion.     Right lower leg: No edema.  Skin:    General: Skin is warm.  Neurological:     General: No focal deficit present.     Mental Status: He is alert and oriented to person, place, and time.     Comments: Bilateral legs warm to touch, normal pulses, no loss of hair noted.   Psychiatric:        Mood and Affect: Mood normal.        Behavior: Behavior normal.        Thought Content: Thought content normal.        Judgment: Judgment normal.    BP 139/89   Pulse 79   Resp 16   Ht 5' 8"  (1.727 m)   Wt 213 lb 12.8 oz (97 kg)   BMI 32.51 kg/m  Wt Readings from Last 3 Encounters:  03/02/21 213 lb 12.8 oz (97 kg)  02/07/21 214 lb (97.1 kg)  01/20/21 210 lb (95.3 kg)    Health Maintenance Due  Topic Date Due   COVID-19 Vaccine (4 - Booster for Pfizer series) 06/03/2020   INFLUENZA VACCINE  01/09/2021   OPHTHALMOLOGY EXAM  02/23/2021    There are no preventive care reminders to display for this  patient.   Lab Results  Component Value Date   TSH 1.290 08/08/2020   Lab  Results  Component Value Date   WBC 4.2 08/08/2020   HGB 14.7 08/08/2020   HCT 43.7 08/08/2020   MCV 91 08/08/2020   PLT 174 08/08/2020   Lab Results  Component Value Date   NA 137 02/07/2021   K 4.0 02/07/2021   CO2 21 02/07/2021   GLUCOSE 184 (H) 02/07/2021   BUN 10 02/07/2021   CREATININE 0.80 02/07/2021   BILITOT <0.2 02/07/2021   ALKPHOS 93 02/07/2021   AST 19 02/07/2021   ALT 18 02/07/2021   PROT 6.5 02/07/2021   ALBUMIN 4.1 02/07/2021   CALCIUM 9.0 02/07/2021   ANIONGAP 8 12/30/2016   EGFR 94 02/07/2021   Lab Results  Component Value Date   CHOL 138 02/07/2021   Lab Results  Component Value Date   HDL 39 (L) 02/07/2021   Lab Results  Component Value Date   LDLCALC 75 02/07/2021   Lab Results  Component Value Date   TRIG 138 02/07/2021   Lab Results  Component Value Date   CHOLHDL 3.5 02/07/2021   Lab Results  Component Value Date   HGBA1C 5.8 (H) 02/07/2021       Assessment & Plan:   Problem List Items Addressed This Visit       Other   Right calf pain - Primary    Acute calf pain x8 days. Worse with walking, improves with rest and elevation. Differentials include DVT, PAD, muscle strain. Will check ultrasound to rule out DVT. Referral placed to vascular. He can continue taking ibuprofen prn, using ice intermittently. Follow up in 4 weeks.       Relevant Orders   US Venous Img Lower Unilateral Right (DVT)     No orders of the defined types were placed in this encounter.  A total of 30 minutes were spent on this encounter today. When total time is documented, this includes both the face-to-face and non-face-to-face time personally spent before, during and after the visit on the date of the encounter.   Charyl Dancer, NP

## 2021-03-03 ENCOUNTER — Ambulatory Visit: Payer: Medicare Other | Admitting: Nurse Practitioner

## 2021-03-07 DIAGNOSIS — H353221 Exudative age-related macular degeneration, left eye, with active choroidal neovascularization: Secondary | ICD-10-CM | POA: Diagnosis not present

## 2021-03-07 DIAGNOSIS — H43813 Vitreous degeneration, bilateral: Secondary | ICD-10-CM | POA: Diagnosis not present

## 2021-03-07 DIAGNOSIS — H353112 Nonexudative age-related macular degeneration, right eye, intermediate dry stage: Secondary | ICD-10-CM | POA: Diagnosis not present

## 2021-03-07 DIAGNOSIS — E119 Type 2 diabetes mellitus without complications: Secondary | ICD-10-CM | POA: Diagnosis not present

## 2021-03-17 DIAGNOSIS — Z20822 Contact with and (suspected) exposure to covid-19: Secondary | ICD-10-CM | POA: Diagnosis not present

## 2021-03-30 ENCOUNTER — Ambulatory Visit: Payer: Medicare Other | Admitting: Nurse Practitioner

## 2021-04-25 DIAGNOSIS — Z86711 Personal history of pulmonary embolism: Secondary | ICD-10-CM | POA: Diagnosis not present

## 2021-04-25 DIAGNOSIS — D3A8 Other benign neuroendocrine tumors: Secondary | ICD-10-CM | POA: Diagnosis not present

## 2021-04-25 DIAGNOSIS — Z7901 Long term (current) use of anticoagulants: Secondary | ICD-10-CM | POA: Diagnosis not present

## 2021-04-25 DIAGNOSIS — C7B8 Other secondary neuroendocrine tumors: Secondary | ICD-10-CM | POA: Diagnosis not present

## 2021-04-25 DIAGNOSIS — C7A8 Other malignant neuroendocrine tumors: Secondary | ICD-10-CM | POA: Diagnosis not present

## 2021-05-16 DIAGNOSIS — H43813 Vitreous degeneration, bilateral: Secondary | ICD-10-CM | POA: Diagnosis not present

## 2021-05-16 DIAGNOSIS — H353112 Nonexudative age-related macular degeneration, right eye, intermediate dry stage: Secondary | ICD-10-CM | POA: Diagnosis not present

## 2021-05-16 DIAGNOSIS — H43821 Vitreomacular adhesion, right eye: Secondary | ICD-10-CM | POA: Diagnosis not present

## 2021-05-16 DIAGNOSIS — H353221 Exudative age-related macular degeneration, left eye, with active choroidal neovascularization: Secondary | ICD-10-CM | POA: Diagnosis not present

## 2021-06-05 ENCOUNTER — Emergency Department: Payer: Medicare Other

## 2021-06-05 ENCOUNTER — Encounter: Payer: Self-pay | Admitting: Emergency Medicine

## 2021-06-05 ENCOUNTER — Other Ambulatory Visit: Payer: Self-pay

## 2021-06-05 DIAGNOSIS — E119 Type 2 diabetes mellitus without complications: Secondary | ICD-10-CM | POA: Insufficient documentation

## 2021-06-05 DIAGNOSIS — Z20822 Contact with and (suspected) exposure to covid-19: Secondary | ICD-10-CM | POA: Diagnosis not present

## 2021-06-05 DIAGNOSIS — R079 Chest pain, unspecified: Secondary | ICD-10-CM | POA: Diagnosis not present

## 2021-06-05 DIAGNOSIS — R0789 Other chest pain: Secondary | ICD-10-CM | POA: Diagnosis present

## 2021-06-05 DIAGNOSIS — K429 Umbilical hernia without obstruction or gangrene: Secondary | ICD-10-CM | POA: Diagnosis not present

## 2021-06-05 DIAGNOSIS — X58XXXA Exposure to other specified factors, initial encounter: Secondary | ICD-10-CM | POA: Diagnosis not present

## 2021-06-05 DIAGNOSIS — N323 Diverticulum of bladder: Secondary | ICD-10-CM | POA: Diagnosis not present

## 2021-06-05 DIAGNOSIS — Z86012 Personal history of benign carcinoid tumor: Secondary | ICD-10-CM | POA: Diagnosis not present

## 2021-06-05 DIAGNOSIS — Z8505 Personal history of malignant neoplasm of liver: Secondary | ICD-10-CM | POA: Insufficient documentation

## 2021-06-05 DIAGNOSIS — J984 Other disorders of lung: Secondary | ICD-10-CM | POA: Diagnosis not present

## 2021-06-05 DIAGNOSIS — I700 Atherosclerosis of aorta: Secondary | ICD-10-CM | POA: Diagnosis not present

## 2021-06-05 DIAGNOSIS — I2510 Atherosclerotic heart disease of native coronary artery without angina pectoris: Secondary | ICD-10-CM | POA: Diagnosis not present

## 2021-06-05 DIAGNOSIS — Z85848 Personal history of malignant neoplasm of other parts of nervous tissue: Secondary | ICD-10-CM | POA: Insufficient documentation

## 2021-06-05 DIAGNOSIS — K209 Esophagitis, unspecified without bleeding: Secondary | ICD-10-CM | POA: Insufficient documentation

## 2021-06-05 DIAGNOSIS — T18128A Food in esophagus causing other injury, initial encounter: Secondary | ICD-10-CM | POA: Insufficient documentation

## 2021-06-05 LAB — TROPONIN I (HIGH SENSITIVITY): Troponin I (High Sensitivity): 4 ng/L (ref <18)

## 2021-06-05 LAB — CBC
HCT: 42.8 % (ref 39.0–52.0)
Hemoglobin: 14.8 g/dL (ref 13.0–17.0)
MCH: 31.7 pg (ref 26.0–34.0)
MCHC: 34.6 g/dL (ref 30.0–36.0)
MCV: 91.6 fL (ref 80.0–100.0)
Platelets: 163 10*3/uL (ref 150–400)
RBC: 4.67 MIL/uL (ref 4.22–5.81)
RDW: 11.8 % (ref 11.5–15.5)
WBC: 11.5 10*3/uL — ABNORMAL HIGH (ref 4.0–10.5)
nRBC: 0 % (ref 0.0–0.2)

## 2021-06-05 LAB — BASIC METABOLIC PANEL
Anion gap: 10 (ref 5–15)
BUN: 12 mg/dL (ref 8–23)
CO2: 27 mmol/L (ref 22–32)
Calcium: 9.1 mg/dL (ref 8.9–10.3)
Chloride: 102 mmol/L (ref 98–111)
Creatinine, Ser: 0.83 mg/dL (ref 0.61–1.24)
GFR, Estimated: >60 mL/min (ref >60)
Glucose, Bld: 151 mg/dL — ABNORMAL HIGH (ref 70–99)
Potassium: 3.8 mmol/L (ref 3.5–5.1)
Sodium: 139 mmol/L (ref 135–145)

## 2021-06-05 MED ORDER — LIDOCAINE VISCOUS HCL 2 % MT SOLN
15.0000 mL | Freq: Once | OROMUCOSAL | Status: AC
  Administered 2021-06-06: 15 mL via ORAL
  Filled 2021-06-05: qty 15

## 2021-06-05 MED ORDER — ALUM & MAG HYDROXIDE-SIMETH 200-200-20 MG/5ML PO SUSP: 30.0000 mL | Freq: Once | ORAL | Status: DC

## 2021-06-05 MED ORDER — ALUM & MAG HYDROXIDE-SIMETH 200-200-20 MG/5ML PO SUSP
30.0000 mL | Freq: Once | ORAL | Status: AC
  Administered 2021-06-06: 30 mL via ORAL
  Filled 2021-06-05: qty 30

## 2021-06-05 NOTE — ED Triage Notes (Signed)
Pt reports chest pain and felling as something "stuck" in esophagus after eating steak at approx 1830. Pt reports pain started in throat and has since radiated down into chest. Pt reports emesis of clear liquids since. Warm coke provided for pt to sip on in triage.

## 2021-06-05 NOTE — ED Notes (Signed)
Pt reporting chest pain that usually worsens with nausea until he eventually vomits. Pt just vomited in triage and reports the pain was worsening at the time of him vomiting.

## 2021-06-06 ENCOUNTER — Observation Stay: Payer: Medicare Other | Admitting: Certified Registered"

## 2021-06-06 ENCOUNTER — Ambulatory Visit
Admission: EM | Admit: 2021-06-06 | Discharge: 2021-06-06 | Disposition: A | Discharge status: Home / Self Care | Payer: Medicare Other | Attending: Gastroenterology | Admitting: Gastroenterology

## 2021-06-06 ENCOUNTER — Other Ambulatory Visit: Payer: Self-pay

## 2021-06-06 ENCOUNTER — Encounter
Admission: EM | Disposition: A | Discharge status: Home / Self Care | Payer: Self-pay | Source: Home / Self Care | Attending: Emergency Medicine

## 2021-06-06 ENCOUNTER — Emergency Department: Payer: Medicare Other

## 2021-06-06 DIAGNOSIS — T18128A Food in esophagus causing other injury, initial encounter: Secondary | ICD-10-CM

## 2021-06-06 DIAGNOSIS — E785 Hyperlipidemia, unspecified: Secondary | ICD-10-CM | POA: Diagnosis not present

## 2021-06-06 DIAGNOSIS — I2510 Atherosclerotic heart disease of native coronary artery without angina pectoris: Secondary | ICD-10-CM | POA: Diagnosis not present

## 2021-06-06 DIAGNOSIS — E1169 Type 2 diabetes mellitus with other specified complication: Secondary | ICD-10-CM | POA: Diagnosis present

## 2021-06-06 DIAGNOSIS — R1319 Other dysphagia: Secondary | ICD-10-CM | POA: Diagnosis not present

## 2021-06-06 DIAGNOSIS — R0789 Other chest pain: Secondary | ICD-10-CM | POA: Diagnosis not present

## 2021-06-06 DIAGNOSIS — T18128D Food in esophagus causing other injury, subsequent encounter: Secondary | ICD-10-CM

## 2021-06-06 DIAGNOSIS — K209 Esophagitis, unspecified without bleeding: Secondary | ICD-10-CM | POA: Diagnosis not present

## 2021-06-06 DIAGNOSIS — K429 Umbilical hernia without obstruction or gangrene: Secondary | ICD-10-CM | POA: Diagnosis not present

## 2021-06-06 DIAGNOSIS — J984 Other disorders of lung: Secondary | ICD-10-CM | POA: Diagnosis not present

## 2021-06-06 DIAGNOSIS — E118 Type 2 diabetes mellitus with unspecified complications: Secondary | ICD-10-CM

## 2021-06-06 DIAGNOSIS — R1310 Dysphagia, unspecified: Secondary | ICD-10-CM | POA: Diagnosis not present

## 2021-06-06 DIAGNOSIS — N323 Diverticulum of bladder: Secondary | ICD-10-CM | POA: Diagnosis not present

## 2021-06-06 DIAGNOSIS — I700 Atherosclerosis of aorta: Secondary | ICD-10-CM | POA: Diagnosis not present

## 2021-06-06 DIAGNOSIS — R0989 Other specified symptoms and signs involving the circulatory and respiratory systems: Secondary | ICD-10-CM

## 2021-06-06 DIAGNOSIS — I10 Essential (primary) hypertension: Secondary | ICD-10-CM | POA: Diagnosis present

## 2021-06-06 DIAGNOSIS — Z86012 Personal history of benign carcinoid tumor: Secondary | ICD-10-CM | POA: Diagnosis not present

## 2021-06-06 DIAGNOSIS — J189 Pneumonia, unspecified organism: Secondary | ICD-10-CM

## 2021-06-06 HISTORY — PX: ESOPHAGOGASTRODUODENOSCOPY: SHX5428

## 2021-06-06 LAB — RESP PANEL BY RT-PCR (FLU A&B, COVID) ARPGX2
Influenza A by PCR: NEGATIVE
Influenza B by PCR: NEGATIVE
SARS Coronavirus 2 by RT PCR: NEGATIVE

## 2021-06-06 LAB — TROPONIN I (HIGH SENSITIVITY): Troponin I (High Sensitivity): 4 ng/L (ref <18)

## 2021-06-06 LAB — CBG MONITORING, ED
Glucose-Capillary: 130 mg/dL — ABNORMAL HIGH (ref 70–99)
Glucose-Capillary: 114 mg/dL — ABNORMAL HIGH (ref 70–99)
Glucose-Capillary: 140 mg/dL — ABNORMAL HIGH (ref 70–99)

## 2021-06-06 SURGERY — EGD (ESOPHAGOGASTRODUODENOSCOPY)
Anesthesia: General

## 2021-06-06 MED ORDER — SODIUM CHLORIDE 0.9 % IV SOLN
1.0000 g | Freq: Once | INTRAVENOUS | Status: AC
  Administered 2021-06-06: 10:00:00 1 g via INTRAVENOUS
  Filled 2021-06-06: qty 10

## 2021-06-06 MED ORDER — SODIUM CHLORIDE 0.9 % IV SOLN: INTRAVENOUS | Status: DC

## 2021-06-06 MED ORDER — MORPHINE SULFATE (PF) 2 MG/ML IV SOLN: 1.0000 mg | INTRAVENOUS | Status: DC | PRN

## 2021-06-06 MED ORDER — ONDANSETRON HCL 4 MG/2ML IJ SOLN
4.0000 mg | Freq: Three times a day (TID) | INTRAMUSCULAR | Status: DC | PRN
  Administered 2021-06-06: 10:00:00 4 mg via INTRAVENOUS
  Filled 2021-06-06: qty 2

## 2021-06-06 MED ORDER — INSULIN ASPART 100 UNIT/ML IJ SOLN
0.0000 [IU] | Freq: Three times a day (TID) | INTRAMUSCULAR | Status: DC
  Administered 2021-06-06: 12:00:00 1 [IU] via SUBCUTANEOUS
  Filled 2021-06-06: qty 1

## 2021-06-06 MED ORDER — PANTOPRAZOLE SODIUM 40 MG PO TBEC: 40.0000 mg | DELAYED_RELEASE_TABLET | Freq: Every day | ORAL | 0 refills | Status: DC

## 2021-06-06 MED ORDER — PROPOFOL 10 MG/ML IV BOLUS
INTRAVENOUS | Status: DC | PRN
  Administered 2021-06-06: 170 mg via INTRAVENOUS

## 2021-06-06 MED ORDER — PHENYLEPHRINE HCL (PRESSORS) 10 MG/ML IV SOLN
INTRAVENOUS | Status: DC | PRN
Start: 2021-06-06 — End: 2021-06-06
  Administered 2021-06-06: 160 ug via INTRAVENOUS
  Administered 2021-06-06: 80 ug via INTRAVENOUS

## 2021-06-06 MED ORDER — ACETAMINOPHEN 325 MG RE SUPP
650.0000 mg | Freq: Four times a day (QID) | RECTAL | Status: DC | PRN
  Filled 2021-06-06: qty 1

## 2021-06-06 MED ORDER — IOHEXOL 300 MG/ML  SOLN
100.0000 mL | Freq: Once | INTRAMUSCULAR | Status: AC | PRN
  Administered 2021-06-06: 08:00:00 100 mL via INTRAVENOUS

## 2021-06-06 MED ORDER — DEXMEDETOMIDINE HCL IN NACL 200 MCG/50ML IV SOLN
INTRAVENOUS | Status: DC | PRN
  Administered 2021-06-06: 12 ug via INTRAVENOUS

## 2021-06-06 MED ORDER — SUCCINYLCHOLINE CHLORIDE 200 MG/10ML IV SOSY
PREFILLED_SYRINGE | INTRAVENOUS | Status: DC | PRN
  Administered 2021-06-06: 100 mg via INTRAVENOUS

## 2021-06-06 MED ORDER — LACTATED RINGERS IV BOLUS: 1000.0000 mL | Freq: Once | INTRAVENOUS | Status: DC

## 2021-06-06 MED ORDER — SUCCINYLCHOLINE CHLORIDE 200 MG/10ML IV SOSY
PREFILLED_SYRINGE | INTRAVENOUS | Status: AC
  Filled 2021-06-06: qty 10

## 2021-06-06 MED ORDER — SODIUM CHLORIDE 0.9 % IV SOLN
500.0000 mg | Freq: Once | INTRAVENOUS | Status: AC
  Administered 2021-06-06: 09:00:00 500 mg via INTRAVENOUS
  Filled 2021-06-06: qty 5

## 2021-06-06 MED ORDER — HYDRALAZINE HCL 20 MG/ML IJ SOLN: 5.0000 mg | INTRAMUSCULAR | Status: DC | PRN

## 2021-06-06 MED ORDER — SODIUM CHLORIDE 0.9 % IV BOLUS: 1000.0000 mL | Freq: Once | INTRAVENOUS | Status: DC

## 2021-06-06 MED ORDER — INSULIN ASPART 100 UNIT/ML IJ SOLN: 0.0000 [IU] | Freq: Every day | INTRAMUSCULAR | Status: DC

## 2021-06-06 MED ORDER — LACTATED RINGERS IV BOLUS
1000.0000 mL | Freq: Once | INTRAVENOUS | Status: AC
  Administered 2021-06-06: 09:00:00 1000 mL via INTRAVENOUS

## 2021-06-06 MED ORDER — ONDANSETRON HCL 4 MG/2ML IJ SOLN
INTRAMUSCULAR | Status: DC | PRN
  Administered 2021-06-06: 4 mg via INTRAVENOUS

## 2021-06-06 MED ORDER — ONDANSETRON HCL 4 MG/2ML IJ SOLN
4.0000 mg | Freq: Once | INTRAMUSCULAR | Status: AC
  Administered 2021-06-06: 13:00:00 4 mg via INTRAVENOUS

## 2021-06-06 MED ORDER — ONDANSETRON HCL 4 MG/2ML IJ SOLN
INTRAMUSCULAR | Status: AC
  Filled 2021-06-06: qty 2

## 2021-06-06 NOTE — ED Provider Notes (Signed)
[facility] Emergency Department Provider Note  ____________________________________________   Event Date/Time   First MD Initiated Contact with Patient 06/06/21 [phone]     (approximate)  I have reviewed the triage vital signs and the nursing notes.   HISTORY  Chief Complaint Chest Pain and Swallowed Foreign Body    HPI Randy Harper is a 70 y.o. male with medical history as listed below which is most notable for history of neuroendocrine tumor resulting in resection of the right lobe of the liver as well as cholecystectomy and follow-up with [facility] every 6 months.  He presents tonight for cute onset lower chest versus upper abdominal pain after eating a steak and tossed salad dinner.  He said it recurred towards the end and he feels like something was stuck in his esophagus at the very bottom of his sternum.  He had nausea and multiple episodes of vomiting but it did not relieve the discomfort.  After drinking a GI cocktail in triage, he had 1 more episode of vomiting but then felt better for the remainder of his lengthy wait to be seen in the emergency department.  He is not having chest pain except for the discomfort as described above.  No shortness of breath.  No recent fever, sore throat, lower abdominal pain, nor dysuria.  The onset of the symptoms was acute and they were severe.  They are currently mild.     Past Medical History:  Diagnosis Date   Blood transfusion without reported diagnosis 01/2017   Cancer ([place]) 01/2017   metastic neuroendocrine - liver   Diabetes mellitus without complication ([place])    Dysrhythmia    supraventricular Tach   Glaucoma 2015   Hyperlipidemia    Hypertension    Muscle strain of chest wall    PE (pulmonary thromboembolism) ([place])     Patient Active Problem List   Diagnosis Date Noted   Right calf pain 03/02/2021   Macular degeneration disease 08/08/2020   Advanced care planning/counseling discussion 06/18/2017    Arthropathy of lumbar facet joint 09/28/2015   BPH (benign prostatic hyperplasia) 03/15/2015   Hypertension    Hyperlipidemia 02/17/2014   Diabetes mellitus associated with hormonal etiology ([place]) 02/17/2014   Hypertension associated with diabetes ([place]) 02/17/2014    Past Surgical History:  Procedure Laterality Date   CHOLECYSTECTOMY  01/2017   related to liver cancer   COLONOSCOPY     COLONOSCOPY WITH PROPOFOL N/A 03/17/2019   Procedure: COLONOSCOPY WITH PROPOFOL;  Surgeon: Lollie Sails, MD;  Location: [facility] ENDOSCOPY;  Service: Endoscopy;  Laterality: N/A;   LAPAROSCOPY ABDOMEN DIAGNOSTIC     resection liver total right lobe  12/2016   SMALL INTESTINE SURGERY  01/2017   cancer surgery   TENDON GRAFT Right    right thumb tendon graft at age 74     Prior to Admission medications   Medication Sig Start Date End Date Taking? Authorizing Provider  Aflibercept 2 MG/0.05ML SOLN Apply to eye. Every 6 weeks injection    [provider]  atorvastatin (LIPITOR) 80 MG tablet Take 1 tablet (80 mg total) by mouth daily. 02/07/21   Jon Billings, NP  brimonidine (ALPHAGAN) 0.2 % ophthalmic solution Place 1 drop into the left eye daily.  09/12/15   [provider]  diltiazem (CARDIZEM CD) 240 MG 24 hr capsule Take 1 capsule (240 mg total) by mouth daily. 02/07/21   Jon Billings, NP  metFORMIN (GLUCOPHAGE) 500 MG tablet Take 1 tablet (500  mg total) by mouth daily with breakfast. 02/07/21   Jon Billings, NP  Multiple Vitamin (MULTIVITAMIN) tablet Take 1 tablet by mouth daily.    [provider]    Allergies Patient has no known allergies.  Family History  Problem Relation Age of Onset   Heart disease Father    Diabetes Maternal Uncle    Diabetes Maternal Uncle    Diabetes Maternal Uncle    Diabetes Maternal Uncle    Diabetes Maternal Grandmother     Social History Social History   Tobacco Use   Smoking status: Never   Smokeless tobacco: Never   Vaping Use   Vaping Use: Never used  Substance Use Topics   Alcohol use: No   Drug use: No    Review of Systems Constitutional: No fever/chills Eyes: No visual changes. ENT: No sore throat. Cardiovascular: Lower chest vs upper abdominal pain. Respiratory: Denies shortness of breath. Gastrointestinal: Lower chest vs upper abdominal pain with N/V. Genitourinary: Negative for dysuria. Musculoskeletal: Negative for neck pain.  Negative for back pain. Integumentary: Negative for rash. Neurological: Negative for headaches, focal weakness or numbness.   ____________________________________________   PHYSICAL EXAM:  VITAL SIGNS: ED Triage Vitals  Enc Vitals Group     BP 06/05/21 2048 (!) 165/85     Pulse Rate 06/05/21 2048 96     Resp 06/05/21 2048 17     Temp 06/05/21 2048 98.2 F (36.8 C)     Temp Source 06/05/21 2048 Oral     SpO2 06/05/21 2048 100 %     Weight 06/05/21 2049 95.3 kg (210 lb)     Height 06/05/21 2049 1.727 m (5\' 8" )     Head Circumference --      Peak Flow --      Pain Score --      Pain Loc --      Pain Edu? --      Excl. in [place]? --     Constitutional: Alert and oriented.  Eyes: Conjunctivae are normal.  Head: Atraumatic. Nose: No congestion/rhinnorhea. Mouth/Throat: Patient is wearing a mask. Neck: No stridor.  No meningeal signs.   Cardiovascular: Normal rate, regular rhythm. Good peripheral circulation. Respiratory: Normal respiratory effort.  No retractions. Gastrointestinal: Soft and nontender other than minimal tenderness in the eipgastrium.  Extensive well-healed surgical scars from previous surgery. Musculoskeletal: No lower extremity tenderness nor edema. No gross deformities of extremities. Neurologic:  Normal speech and language. No gross focal neurologic deficits are appreciated.  Skin:  Skin is warm, dry and intact. Psychiatric: Mood and affect are normal. Speech and behavior are  normal.  ____________________________________________   LABS (all labs ordered are listed, but only abnormal results are displayed)  Labs Reviewed  BASIC METABOLIC PANEL - Abnormal; Notable for the following components:      Result Value   Glucose, Bld 151 (*)    All other components within normal limits  CBC - Abnormal; Notable for the following components:   WBC 11.5 (*)    All other components within normal limits  TROPONIN I (HIGH SENSITIVITY)  TROPONIN I (HIGH SENSITIVITY)   ____________________________________________  EKG  ED ECG REPORT I, Hinda Kehr, the attending physician, personally viewed and interpreted this ECG.  Date: 06/05/2021 EKG Time: 20:47 Rate: 103 Rhythm: Sinus tachycardia QRS Axis: normal Intervals: Right bundle branch block ST/T Wave abnormalities: Non-specific ST segment / T-wave changes, but no clear evidence of acute ischemia. Narrative Interpretation: no definitive evidence of acute ischemia; does not  meet STEMI criteria.  ____________________________________________  RADIOLOGY I, Hinda Kehr, personally viewed and evaluated these images (plain radiographs) as part of my medical decision making, as well as reviewing the written report by the radiologist.  ED MD interpretation: No acute abnormalities on chest x-ray.  CT scans pending at the time of transfer of care.  Official radiology report(s): DG Chest 2 View  Result Date: 06/05/2021 CLINICAL DATA:  Chest pain, possible food bolus stuck in the esophagus, initial encounter EXAM: CHEST - 2 VIEW COMPARISON:  None. FINDINGS: The heart size and mediastinal contours are within normal limits. Both lungs are clear. The visualized skeletal structures are unremarkable. IMPRESSION: No active cardiopulmonary disease. Electronically Signed   By: Inez Catalina M.D.   On: 06/05/2021 21:00    ____________________________________________   PROCEDURES   Procedure(s) performed (including Critical  Care):  .1-3 Lead EKG Interpretation Performed by: Hinda Kehr, MD Authorized by: Hinda Kehr, MD     Interpretation: abnormal     ECG rate:  112   ECG rate assessment: tachycardic     Rhythm: sinus tachycardia     Ectopy: none     Conduction: normal     ____________________________________________   INITIAL IMPRESSION / MDM / ASSESSMENT AND PLAN / ED COURSE  As part of my medical decision making, I reviewed the following data within the [place] History obtained from family, Nursing notes reviewed and incorporated, Labs reviewed , EKG interpreted , [place] chart reviewed, Patient signed out to , Radiograph reviewed Dr. Cinda Quest, and Notes from prior ED visits   Differential diagnosis includes, but is not limited to, esophageal food bolus, esophageal stricture, gastritis, SBO/ileus, volvulus, neoplastic complication.  The patient is on the cardiac monitor to evaluate for evidence of arrhythmia and/or significant heart rate changes.  The patient is in no distress at this time.  However he has not been able to eat or drink anything in about 11 hours without vomiting.  He is tolerating his own secretions.  He is not in severe distress and has only very minimal epigastric tenderness to palpation.  Basic metabolic panel, high-sensitivity troponin, CBC all generally reassuring.  EKG shows no sign of ischemia and I strongly doubt cardiac etiology, including ACS as well as PE.  Patient is slightly tachycardic but I believe this is due to volume depletion since he has not had anything to drink in the last 11+ hours.  I ordered a 1 L IV fluid bolus of LR.  Initially I thought the patient would be able to be discharged and I gave him a ginger ale for p.o. trial.  He said that almost immediately after drinking and he had pain again and then vomited it back up.  Given the somewhat unusual story, as well as the fact that the impaction, if there is 1, is down in the distal esophagus  just proximal to the the stomach, I think the patient might benefit from noninvasive imaging with CT scans before immediately going to gastroenterology for an endoscopy.  I talked with the patient about this and he agrees.  I ordered a CT chest/abdomen/pelvis for further evaluation of possible food bolus versus SBO/ileus versus volvulus, or other explanation for the patient's symptoms.  I am transferring ED care to Dr. Cinda Quest to follow-up on the results and consult GI or other service as needed.         ____________________________________________  FINAL CLINICAL IMPRESSION(S) / ED DIAGNOSES  Final diagnoses:  Atypical chest pain  Choking  episode     MEDICATIONS GIVEN DURING THIS VISIT:  Medications  lactated ringers bolus 1,000 mL (has no administration in time range)  alum & mag hydroxide-simeth (MAALOX/MYLANTA) 200-200-20 MG/5ML suspension 30 mL (30 mLs Oral Given 06/06/21 0012)    And  lidocaine (XYLOCAINE) 2 % viscous mouth solution 15 mL (15 mLs Oral Given 06/06/21 0012)     ED Discharge Orders     None        Note:  This document was prepared using Dragon voice recognition software and may include unintentional dictation errors.   Hinda Kehr, MD 06/06/21 [phone]

## 2021-06-06 NOTE — Op Note (Addendum)
[facility] Gastroenterology Patient Name: Randy Harper Procedure Date: 06/06/2021 12:49 PM MRN: [id number] Account #: [id number] Date of Birth: 08-Oct-1950 Admit Type: Inpatient Age: 70 Room: [facility] ENDO ROOM 1 Gender: Male Note Status: Finalized Instrument Name: Upper Endoscope [phone] Procedure:             Upper GI endoscopy Indications:           Foreign body in the esophagus Providers:             Nijah Orlich B. Bonna Gains MD, MD Medicines:             Monitored Anesthesia Care Complications:         No immediate complications. Procedure:             Pre-Anesthesia Assessment:                        - The risks and benefits of the procedure and the                         sedation options and risks were discussed with the                         patient. All questions were answered and informed                         consent was obtained.                        - Patient identification and proposed procedure were                         verified prior to the procedure.                        - ASA Grade Assessment: II - A patient with mild                         systemic disease.                        After obtaining informed consent, the endoscope was                         passed under direct vision. Throughout the procedure,                         the patient's blood pressure, pulse, and oxygen                         saturations were monitored continuously. The Endoscope                         was introduced through the mouth, and advanced to the                         second part of duodenum. The upper GI endoscopy was                         accomplished with ease. The patient tolerated the  procedure well. Findings:      Food was found in the distal esophagus. Removal of food was accomplished.      LA Grade A (one or more mucosal breaks less than 5 mm, not extending       between tops of 2 mucosal folds) esophagitis was found  in the distal       esophagus. Due to presence of esophagitis and absence of any obvious       rings or strictures in the area dilation is not indicated at this time.      The exam of the esophagus was otherwise normal.      There is no endoscopic evidence of mass in the entire esophagus.      The entire examined stomach was normal.      The examined duodenum was normal. Impression:            - Food in the distal esophagus. Removal was successful.                        - LA Grade A esophagitis.                        - Normal stomach.                        - Normal examined duodenum. Recommendation:        - Chopped diet.                        - Use Protonix (pantoprazole) 40 mg PO daily for 4                         weeks.                        - - Repeat upper endoscopy to evaluate GE junction and                         consider dilation if schtazki ring or stenosis is                         present after esophagitis at the site of food                         impaction heals with PPI treatment.                        - Return to GI clinic in 3 weeks.                        - Follow an antireflux regimen.                        - Avoid NSAID use (like Ibuprofen, Aleeve, Motrin,                         Advil, Meloxicam, Goodie Powder, BC powder etc.)                         except for Aspirin if it is medically recommended by  PCP or cardiology                        - Return to primary care physician in 4 weeks.                        - The findings and recommendations were discussed with                         the patient.                        - Return patient to hospital ward for ongoing care.                        - The findings and recommendations were discussed with                         the patient's family. Procedure Code(s):     --- Professional ---                        [phone], Esophagogastroduodenoscopy, flexible,                          transoral; with removal of foreign body(s) Diagnosis Code(s):     --- Professional ---                        P59.163W, Food in esophagus causing other injury,                         initial encounter                        K20.90, Esophagitis, unspecified without bleeding                        T18.108A, Unspecified foreign body in esophagus                         causing other injury, initial encounter CPT copyright 2019 American Medical Association. All rights reserved. The codes documented in this report are preliminary and upon coder review may  be revised to meet current compliance requirements.  Vonda Antigua, MD Margretta Sidle B. Bonna Gains MD, MD 06/06/2021 1:51:36 PM This report has been signed electronically. Number of Addenda: 0 Note Initiated On: 06/06/2021 12:49 PM Estimated Blood Loss:  Estimated blood loss: none.      [facility]

## 2021-06-06 NOTE — Anesthesia Procedure Notes (Signed)
Procedure Name: Intubation Date/Time: 06/06/2021 1:13 PM Performed by: Jonna Clark, CRNA Pre-anesthesia Checklist: Patient identified, Patient being monitored, Timeout performed, Emergency Drugs available and Suction available Patient Re-evaluated:Patient Re-evaluated prior to induction Oxygen Delivery Method: Circle system utilized Preoxygenation: Pre-oxygenation with 100% oxygen Induction Type: IV induction Ventilation: Mask ventilation without difficulty Laryngoscope Size: 3 and McGraph Grade View: Grade I Tube type: Oral Tube size: 7.0 mm Number of attempts: 1 Airway Equipment and Method: Stylet Placement Confirmation: ETT inserted through vocal cords under direct vision, positive ETCO2 and breath sounds checked- equal and bilateral Secured at: 21 cm Tube secured with: Tape Dental Injury: Teeth and Oropharynx as per pre-operative assessment

## 2021-06-06 NOTE — Discharge Instructions (Addendum)
As we discussed, your evaluation was reassuring, and we believe you most likely had a brief episode of choking due to the steak you ate last night.  Please avoid eating steak and other similar foods and read through the included information about swallowed foreign bodies.  Please call the office of Dr. Vicente Males to schedule follow-up appointment with gastroenterology; they may wish to proceed with an endoscopy to evaluate your esophagus and determine if it has a stricture (narrowing) that may predispose you to this type of incident.  Continue with your regular medications.  Drink plenty of fluids as you are likely a bit dehydrated after not eating or drinking overnight.  Return to the emergency department if you develop new or worsening symptoms that concern you.

## 2021-06-06 NOTE — ED Provider Notes (Signed)
CT shows a food impaction and possible pneumonia versus aspiration.  I will page GI and see about admitting this gentleman probably have the hospitalist evaluate him as well for the pneumonia.  He is not hypoxic but he is tachycardic and has a white count.   Nena Polio, MD 06/06/21 [phone]

## 2021-06-06 NOTE — Anesthesia Preprocedure Evaluation (Addendum)
Anesthesia Evaluation  Patient identified by MRN, date of birth, ID band Patient awake    Reviewed: Allergy & Precautions, NPO status , Patient's Chart, lab work & pertinent test results  Airway Mallampati: III  TM Distance: >3 FB Neck ROM: Full    Dental no notable dental hx.    Pulmonary neg pulmonary ROS,    Pulmonary exam normal        Cardiovascular hypertension, Pt. on medications Normal cardiovascular exam+ dysrhythmias (SVT)      Neuro/Psych  Neuromuscular disease negative psych ROS   GI/Hepatic negative GI ROS, Neg liver ROS,   Endo/Other  negative endocrine ROSdiabetes, Oral Hypoglycemic Agents  Renal/GU negative Renal ROS  negative genitourinary   Musculoskeletal negative musculoskeletal ROS (+)   Abdominal   Peds negative pediatric ROS (+)  Hematology negative hematology ROS (+)   Anesthesia Other Findings  Blood transfusion without reported diagnosis 01/2017  Cancer ([place]) 01/2017  metastic neuroendocrine - liver  Diabetes mellitus without complication  Dysrhythmia   supraventricular Tach  Glaucoma 2015  Hyperlipidemia  08/23/20 Normal treadmill EKG without evidence of myocardial ischemia with bundle  branch block  Normal myocardial perfusion without evidence of myocardial ischemia  EF 67%  Hypertension  CT shows a food impaction and possible pneumonia versus aspiration  Muscle strain of chest wall   PE (pulmonary thromboembolism) ([facility])     Reproductive/Obstetrics negative OB ROS                            Anesthesia Physical Anesthesia Plan  ASA: 3 and emergent  Anesthesia Plan: General   Post-op Pain Management:    Induction: Intravenous, Rapid sequence and Cricoid pressure planned  PONV Risk Score and Plan: 4 or greater and Ondansetron and Propofol infusion  Airway Management Planned: Oral ETT  Additional Equipment:   Intra-op Plan:    Post-operative Plan: Extubation in OR  Informed Consent: I have reviewed the patients History and Physical, chart, labs and discussed the procedure including the risks, benefits and alternatives for the proposed anesthesia with the patient or authorized representative who has indicated his/her understanding and acceptance.     Dental advisory given  Plan Discussed with: CRNA, Anesthesiologist and Surgeon  Anesthesia Plan Comments:         Anesthesia Quick Evaluation

## 2021-06-06 NOTE — Transfer of Care (Signed)
Immediate Anesthesia Transfer of Care Note  Patient: Randy Harper  Procedure(s) Performed: ESOPHAGOGASTRODUODENOSCOPY (EGD)  Patient Location: PACU  Anesthesia Type:General  Level of Consciousness: drowsy and patient cooperative  Airway & Oxygen Therapy: Patient Spontanous Breathing and Patient connected to nasal cannula oxygen  Post-op Assessment: Report given to RN and Post -op Vital signs reviewed and stable  Post vital signs: Reviewed and stable  Last Vitals:  Vitals Value Taken Time  BP 93/52 06/06/21 1335  Temp    Pulse 103 06/06/21 1335  Resp 25 06/06/21 1335  SpO2 94 % 06/06/21 1335  Vitals shown include unvalidated device data.  Last Pain:  Vitals:   06/06/21 1241  TempSrc: Temporal  PainSc: 8          Complications: No notable events documented.

## 2021-06-06 NOTE — Consult Note (Signed)
Vonda Antigua, MD [address], [place], [place], [place], [address] [address], [place], [place], [place], [address] Phone: [phone]  Fax: [phone]  Consultation  Referring Provider:     Dr. Cinda Quest Primary Care Physician:  Jon Billings, NP Reason for Consultation:     Food impaction  Date of Admission:  06/06/2021 Date of Consultation:  06/06/2021         HPI:   Randy Harper is a 70 y.o. male who presents with symptoms of food impaction after eating dinner yesterday night.  Denies any previous history of similar symptoms.  However, does report intermittent dysphagia to solid foods only for the last few months.  Reports having an upper endoscopy in the year 2000 at [facility].  Procedure report not available.  Has had episodes of emesis since dinner last night, with pieces of food seen with the initial episode of emesis at home, but none since then.  No hematemesis.  No abdominal pain.  No altered bowel habits.  Denies any anticoagulants or antiplatelets  CT done in the ER today shows air-fluid level in the esophagus, with filling defect in the distal esophagus above the GE junction reflecting food bolus versus mass  Patient has history of neuroendocrine tumor and followed at [facility].  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 metastatic to liver, s/p hepatectomy and small bowel resection in July 2018  Past Medical History:  Diagnosis Date   Blood transfusion without reported diagnosis 01/2017   Cancer [facility]) 01/2017   metastic neuroendocrine - liver   Diabetes mellitus without complication ([place])    Dysrhythmia    supraventricular Tach   Glaucoma 2015   Hyperlipidemia    Hypertension    Muscle strain of chest wall    PE (pulmonary thromboembolism) ([place])     Past Surgical History:  Procedure Laterality Date   CHOLECYSTECTOMY  01/2017   related to liver cancer    COLONOSCOPY     COLONOSCOPY WITH PROPOFOL N/A 03/17/2019   Procedure: COLONOSCOPY WITH PROPOFOL;  Surgeon: Lollie Sails, MD;  Location: [facility] ENDOSCOPY;  Service: Endoscopy;  Laterality: N/A;   LAPAROSCOPY ABDOMEN DIAGNOSTIC     resection liver total right lobe  12/2016   SMALL INTESTINE SURGERY  01/2017   cancer surgery   TENDON GRAFT Right    right thumb tendon graft at age 5     Prior to Admission medications   Medication Sig Start Date End Date Taking? Authorizing Provider  acetaminophen (TYLENOL) 500 MG tablet Take 500 mg by mouth every 6 (six) hours as needed for mild pain, moderate pain or headache.   Yes [provider]  atorvastatin (LIPITOR) 80 MG tablet Take 1 tablet (80 mg total) by mouth daily. 02/07/21  Yes Jon Billings, NP  brimonidine (ALPHAGAN) 0.2 % ophthalmic solution Place 1 drop into the left eye daily.  09/12/15  Yes [provider]  diltiazem (CARDIZEM CD) 240 MG 24 hr capsule Take 1 capsule (240 mg total) by mouth daily. 02/07/21  Yes Jon Billings, NP  metFORMIN (GLUCOPHAGE) 500 MG tablet Take 1 tablet (500 mg total) by mouth daily with breakfast. 02/07/21  Yes Jon Billings, NP  Multiple Vitamin (MULTIVITAMIN) tablet Take 1 tablet by mouth daily.   Yes [provider]  Aflibercept 2 MG/0.05ML SOLN Apply to eye. Every 6 weeks injection    [provider]    Family History  Problem  Relation Age of Onset   Heart disease Father    Diabetes Maternal Uncle    Diabetes Maternal Uncle    Diabetes Maternal Uncle    Diabetes Maternal Uncle    Diabetes Maternal Grandmother      Social History   Tobacco Use   Smoking status: Never   Smokeless tobacco: Never  Vaping Use   Vaping Use: Never used  Substance Use Topics   Alcohol use: No   Drug use: No    Allergies as of 06/05/2021   (No Known Allergies)    Review of Systems:    All systems reviewed and negative except where noted in HPI.   Physical Exam:   Constitutional: General:   Alert,  Well-developed, well-nourished, pleasant and cooperative in NAD BP (!) 153/62    Pulse (!) 109    Temp 97.8 F (36.6 C) (Temporal)    Resp 18    Ht 5\' 8"  (1.727 m)    Wt 95.3 kg    SpO2 96%    BMI 31.93 kg/m   Eyes:  Sclera clear, no icterus.   Conjunctiva pink. PERRLA  Ears:  No scars, lesions or masses, Normal auditory acuity. Nose:  No deformity, discharge, or lesions. Mouth:  No deformity or lesions, oropharynx pink & moist.  Neck:  Supple; no masses or thyromegaly.  Respiratory: Normal respiratory effort, Normal percussion  Gastrointestinal:  Normal bowel sounds.  No bruits.  Soft, non-tender and non-distended without masses, hepatosplenomegaly or hernias noted.  No guarding or rebound tenderness.     Cardiac: No clubbing or edema.  No cyanosis. Normal posterior tibial pedal pulses noted.  Lymphatic:  No significant cervical or axillary adenopathy.  Psych:  Alert and cooperative. Normal mood and affect.  Musculoskeletal:  Normal gait. Head normocephalic, atraumatic. Symmetrical without gross deformities. 5/5 Upper and Lower extremity strength bilaterally.  Skin: Warm. Intact without significant lesions or rashes. No jaundice.  Neurologic:  Face symmetrical, tongue midline, Normal sensation to touch;  grossly normal neurologically.  Psych:  Alert and oriented x3, Alert and cooperative. Normal mood and affect.   LAB RESULTS: Recent Labs    06/05/21 2051  WBC 11.5*  HGB 14.8  HCT 42.8  PLT 163   BMET Recent Labs    06/05/21 2051  NA 139  K 3.8  CL 102  CO2 27  GLUCOSE 151*  BUN 12  CREATININE 0.83  CALCIUM 9.1   LFT No results for input(s): PROT, ALBUMIN, AST, ALT, ALKPHOS, BILITOT, BILIDIR, IBILI in the last 72 hours. PT/INR No results for input(s): LABPROT, INR in the last 72 hours.  STUDIES: DG Chest 2 View  Result Date: 06/05/2021 CLINICAL DATA:  Chest pain, possible food bolus stuck in the esophagus, initial  encounter EXAM: CHEST - 2 VIEW COMPARISON:  None. FINDINGS: The heart size and mediastinal contours are within normal limits. Both lungs are clear. The visualized skeletal structures are unremarkable. IMPRESSION: No active cardiopulmonary disease. Electronically Signed   By: Inez Catalina M.D.   On: 06/05/2021 21:00   CT CHEST ABDOMEN PELVIS W CONTRAST  Result Date: 06/06/2021 CLINICAL DATA:  Evaluate for foreign body. Upper abdominal pain after eating steak. History of neuroendocrine tumor EXAM: CT CHEST, ABDOMEN, AND PELVIS WITH CONTRAST TECHNIQUE: Multidetector CT imaging of the chest, abdomen and pelvis was performed following the standard protocol during bolus administration of intravenous contrast. CONTRAST:  158mL OmnipaqueAQUE IOHEXOL 300 MG/ML  SOLN COMPARISON:  12/30/2016 FINDINGS: CT CHEST FINDINGS Cardiovascular: Heart size is  normal. No pericardial effusion. Aortic and coronary artery atherosclerotic calcifications. Mediastinum/Nodes: No enlarged mediastinal, hilar, or axillary lymph nodes. Thyroid gland appears normal. Trachea is patent and midline. Patulous, dilated esophagus with air-fluid level noted. Filling defect within the distal esophagus just above the GE junction is fat and soft tissue attenuation in may reflect impacted food bolus versus mass. Lungs/Pleura: Ground-glass and airspace densities with thickening of the peribronchovascular interstitium is noted in the left lung base concerning for either aspiration or pneumonia. No pleural effusion identified. Musculoskeletal: No chest wall mass or suspicious bone lesions identified. CT ABDOMEN PELVIS FINDINGS Hepatobiliary: Postop changes from right hepatectomy identified. No suspicious mass identified. Cholecystectomy. No bile duct dilatation. Pancreas: Unremarkable. No pancreatic ductal dilatation or surrounding inflammatory changes. Spleen: Normal in size without focal abnormality. Adrenals/Urinary Tract: Normal appearance of the right  adrenal gland. Left adrenal nodule measures 1.2 cm, image 64/2. This was present on the comparison exam and is compatible with a benign adenoma. Similar appearance of bilateral perinephric fat stranding which may be related to prior insult. No mass or hydronephrosis identified. Small diverticula arises off the posterior wall of the bladder measuring 1.2 cm. No focal bladder lesions identified. Stomach/Bowel: Stomach appears nondistended. The appendix is visualized and is within normal limits. Signs of previous small bowel resection. No bowel wall thickening, inflammation, or distension. Vascular/Lymphatic: Aortic atherosclerosis. No aneurysm. No abdominopelvic adenopathy identified. Reproductive: Prostate is unremarkable. Other: 6 mm peritoneal nodule anterior to the descending colon is unchanged from prior exam, image 81/2. Umbilical hernia is identified which contains fat only, image 95/2. Musculoskeletal: No acute or significant osseous findings. IMPRESSION: 1. Patulous, dilated esophagus with air-fluid level. Filling defect within the distal esophagus just above the GE junction is fat and soft tissue attenuation and may reflect impacted food bolus versus mass. Correlation with direct visualization is advised. 2. Ground-glass and airspace densities with thickening of the peribronchovascular interstitium in the left lung base concerning for either aspiration or pneumonia. 3. No acute findings identified within the abdomen or pelvis. 4. Stable left adrenal nodule, compatible with a benign adenoma. 5. Umbilical hernia which contains fat only. 6. Aortic Atherosclerosis (ICD10-I70.0). Electronically Signed   By: Kerby Moors M.D.   On: 06/06/2021 08:29      Impression / Plan:   Randy Harper is a 70 y.o. y/o male with history of neuroendocrine tumor of the ileum, metastatic to liver, status post right hepatectomy and small bowel resection, presents with symptoms of food impaction, with CT showing food bolus  versus mass in the distal esophagus  EGD indicated for removal of food bolus in the esophagus and rule out mass in the area  Patient has been seen and evaluated by anesthesiology  CT also shows groundglass densities in the lung.  Further management for possible pneumonia by primary team/ER  I have discussed alternative options, risks & benefits,  which include, but are not limited to, bleeding, infection, perforation,respiratory complication & drug reaction.  The patient agrees with this plan & written consent will be obtained.     Thank you for involving me in the care of this patient.      LOS: 0 days   Virgel Manifold, MD  06/06/2021, 1:03 PM

## 2021-06-06 NOTE — ED Notes (Signed)
Endo reports they will be here around 1230-1300.

## 2021-06-06 NOTE — Anesthesia Postprocedure Evaluation (Signed)
Anesthesia Post Note  Patient: Randy Harper  Procedure(s) Performed: ESOPHAGOGASTRODUODENOSCOPY (EGD)  Patient location during evaluation: Phase II Anesthesia Type: General Level of consciousness: awake and alert, awake and oriented Pain management: pain level controlled Vital Signs Assessment: post-procedure vital signs reviewed and stable Respiratory status: spontaneous breathing, nonlabored ventilation and respiratory function stable Cardiovascular status: blood pressure returned to baseline and stable Postop Assessment: no apparent nausea or vomiting Anesthetic complications: no   No notable events documented.   Last Vitals:  Vitals:   06/06/21 1348 06/06/21 1410  BP:    Pulse: (!) 103 98  Resp: (!) 28 (!) 23  Temp:  36.7 C  SpO2: 95% 96%    Last Pain:  Vitals:   06/06/21 1410  TempSrc:   PainSc: 0-No pain                 Phill Mutter

## 2021-06-06 NOTE — ED Notes (Signed)
Patient transported to CT 

## 2021-06-07 ENCOUNTER — Encounter: Payer: Self-pay | Admitting: Gastroenterology

## 2021-06-07 NOTE — Progress Notes (Signed)
Patient ID: Randy Harper, male   DOB: May 26, 1951, 70 y.o.   MRN: [id number]   This is a no charge note  Pt presented with esophageal food impact.  GI was consulted, EGD was done, impacted food was removed. Initially I was consulted for admission due to concerning for aspiration pneumonia.  But patient does not have any cough or shortness of breath.  Has mild leukocytosis, no fever.  Clinically does not have aspiration pneumonia.  I do not think patient needs to be admitted for aspiration pneumonia.  Discussed with the ED physician, Dr. Rip Harbour agreed.  Patient is discharged home.    Ivor Costa, MD  [facility]   If 7PM-7AM, please contact night-coverage [email or web address] 06/07/2021, 6:19 PM

## 2021-06-09 ENCOUNTER — Telehealth: Payer: Self-pay

## 2021-06-09 NOTE — Telephone Encounter (Signed)
Pt called and stated that he did not want to schedule appt at this time and will call back to schedule

## 2021-06-09 NOTE — Telephone Encounter (Signed)
-----   Message from Virgel Manifold, MD sent at 06/06/2021  4:05 PM EST ----- Threasa Beards, please make clinic follow up for pt in 3-4 weeks for follow up of food impaction. Thank you

## 2021-06-09 NOTE — Telephone Encounter (Signed)
-----   Message from Virgel Manifold, MD sent at 06/06/2021  4:05 PM EST ----- Randy Harper, please make clinic follow up for pt in 3-4 weeks for follow up of food impaction. Thank you

## 2021-06-21 DIAGNOSIS — K222 Esophageal obstruction: Secondary | ICD-10-CM | POA: Diagnosis not present

## 2021-06-21 DIAGNOSIS — K209 Esophagitis, unspecified without bleeding: Secondary | ICD-10-CM | POA: Diagnosis not present

## 2021-06-21 DIAGNOSIS — T18128A Food in esophagus causing other injury, initial encounter: Secondary | ICD-10-CM | POA: Diagnosis not present

## 2021-06-27 ENCOUNTER — Other Ambulatory Visit: Payer: Self-pay | Admitting: Nurse Practitioner

## 2021-06-27 DIAGNOSIS — I152 Hypertension secondary to endocrine disorders: Secondary | ICD-10-CM

## 2021-06-27 DIAGNOSIS — E1159 Type 2 diabetes mellitus with other circulatory complications: Secondary | ICD-10-CM

## 2021-06-27 NOTE — Telephone Encounter (Signed)
Requested Prescriptions  °Pending Prescriptions Disp Refills  °• metFORMIN (GLUCOPHAGE) 500 MG tablet [Pharmacy Med Name: metFORMIN HCl 500 MG Oral Tablet] 90 tablet 3  °  Sig: TAKE 1 TABLET BY MOUTH  DAILY WITH BREAKFAST  °  ° Endocrinology:  Diabetes - Biguanides Passed - 06/27/2021 10:56 PM  °  °  Passed - Cr in normal range and within 360 days  °  Creatinine, Ser  °Date Value Ref Range Status  °06/05/2021 0.83 0.61 - 1.24 mg/dL Final  °   °  °  Passed - HBA1C is between 0 and 7.9 and within 180 days  °  Hemoglobin A1C  °Date Value Ref Range Status  °01/19/2016 6.3  Final  ° °HB A1C (BAYER DCA - WAIVED)  °Date Value Ref Range Status  °01/14/2019 5.6 <7.0 % Final  °  Comment:  °                                        Diabetic Adult            <7.0 °                                      Healthy Adult        4.3 - 5.7 °                                                          (DCCT/NGSP) °American Diabetes Association's Summary of Glycemic Recommendations °for Adults with Diabetes: Hemoglobin A1c <7.0%. More stringent °glycemic goals (A1c <6.0%) may further reduce complications at the °cost of increased risk of hypoglycemia. °  ° °Hgb A1c MFr Bld  °Date Value Ref Range Status  °02/07/2021 5.8 (H) 4.8 - 5.6 % Final  °  Comment:  °           Prediabetes: 5.7 - 6.4 °         Diabetes: >6.4 °         Glycemic control for adults with diabetes: <7.0 °  °   °  °  Passed - eGFR in normal range and within 360 days  °  GFR calc Af Amer  °Date Value Ref Range Status  °02/04/2020 110 >59 mL/min/1.73 Final  °  Comment:  °  **[facility] currently reports eGFR in compliance with the current** °  recommendations of the National Kidney Foundation. [facility] will °  update reporting as new guidelines are published from the NKF-ASN °  Task force. °  ° °GFR, Estimated  °Date Value Ref Range Status  °06/05/2021 >60 >60 mL/min Final  °  Comment:  °  (NOTE) °Calculated using the CKD-EPI Creatinine Equation (2021) °  ° °eGFR  °Date Value Ref  Range Status  °02/07/2021 94 >59 mL/min/1.73 Final  °   °  °  Passed - Valid encounter within last 6 months  °  Recent Outpatient Visits   °      ° 3 months ago Right calf pain  ° [facility] [place], Lauren A, NP  ° 4 months ago Diabetes mellitus associated with hormonal etiology (HCC)  °   [place], NP   10 months ago Annual physical exam   [facility] Jon Billings, NP   1 year ago Hypertension associated with diabetes [facility])   [facility] [place], [place]   1 year ago Annual physical exam   [facility] [employer], [place]      Future Appointments            In 1 month Jon Billings, NP [facility], [place]   In 6 months  [employer], [place]

## 2021-06-28 ENCOUNTER — Other Ambulatory Visit: Payer: Self-pay | Admitting: Nurse Practitioner

## 2021-06-28 DIAGNOSIS — E782 Mixed hyperlipidemia: Secondary | ICD-10-CM

## 2021-06-28 NOTE — Telephone Encounter (Signed)
Requested Prescriptions  Pending Prescriptions Disp Refills   atorvastatin (LIPITOR) 80 MG tablet [Pharmacy Med Name: Atorvastatin Calcium 80 MG Oral Tablet] 90 tablet 0    Sig: TAKE 1 TABLET BY MOUTH  DAILY     Cardiovascular:  Antilipid - Statins Failed - 06/28/2021  7:53 AM      Failed - HDL in normal range and within 360 days    HDL  Date Value Ref Range Status  02/07/2021 39 (L) >39 mg/dL Final         Passed - Total Cholesterol in normal range and within 360 days    Cholesterol, Total  Date Value Ref Range Status  02/07/2021 138 100 - 199 mg/dL Final   Cholesterol Piccolo, Waived  Date Value Ref Range Status  01/14/2019 147 <200 mg/dL Final    Comment:                            Desirable                <200                         Borderline High      200- 239                         High                     >239          Passed - LDL in normal range and within 360 days    LDL Chol Calc (NIH)  Date Value Ref Range Status  02/07/2021 75 0 - 99 mg/dL Final         Passed - Triglycerides in normal range and within 360 days    Triglycerides  Date Value Ref Range Status  02/07/2021 138 0 - 149 mg/dL Final   Triglycerides Piccolo,Waived  Date Value Ref Range Status  01/14/2019 113 <150 mg/dL Final    Comment:                            Normal                   <150                         Borderline High     150 - 199                         High                200 - 499                         Very High                >499          Passed - Patient is not pregnant      Passed - Valid encounter within last 12 months    Recent Outpatient Visits          3 months ago Right calf pain   [place], Lauren A, NP   4 months ago Diabetes mellitus associated with hormonal etiology ([place])   [facility] Jon Billings, NP   10 months ago Annual physical exam   [facility] Jon Billings, NP   1 year ago  Hypertension associated with diabetes [facility])   [facility] [employer], [place]   1 year ago Annual physical exam   [facility] [place], [place]      Future Appointments            In 1 month Jon Billings, NP [facility], [place]   In 6 months  [employer], [place]

## 2021-07-18 ENCOUNTER — Other Ambulatory Visit: Payer: Self-pay | Admitting: Nurse Practitioner

## 2021-07-18 DIAGNOSIS — E1159 Type 2 diabetes mellitus with other circulatory complications: Secondary | ICD-10-CM

## 2021-07-18 DIAGNOSIS — I152 Hypertension secondary to endocrine disorders: Secondary | ICD-10-CM

## 2021-07-19 NOTE — Telephone Encounter (Signed)
Requested Prescriptions  Pending Prescriptions Disp Refills   diltiazem (CARDIZEM CD) 240 MG 24 hr capsule [Pharmacy Med Name: DILTIAZEM  240MG   CAP  24 HR CD] 90 capsule 0    Sig: TAKE 1 CAPSULE BY MOUTH  DAILY     Cardiovascular: Calcium Channel Blockers 3 Passed - 07/18/2021 10:14 PM      Passed - ALT in normal range and within 360 days    ALT  Date Value Ref Range Status  02/07/2021 18 0 - 44 IU/L Final   ALT (SGPT) Piccolo, Waived  Date Value Ref Range Status  01/14/2019 26 10 - 47 U/L Final         Passed - AST in normal range and within 360 days    AST  Date Value Ref Range Status  02/07/2021 19 0 - 40 IU/L Final   AST (SGOT) Piccolo, Waived  Date Value Ref Range Status  01/14/2019 32 11 - 38 U/L Final         Passed - Cr in normal range and within 360 days    Creatinine, Ser  Date Value Ref Range Status  06/05/2021 0.83 0.61 - 1.24 mg/dL Final         Passed - Last BP in normal range    BP Readings from Last 1 Encounters:  06/06/21 (!) 108/53         Passed - Last Heart Rate in normal range    Pulse Readings from Last 1 Encounters:  06/06/21 100         Passed - Valid encounter within last 6 months    Recent Outpatient Visits          4 months ago Right calf pain   [place], Lauren A, NP   5 months ago Diabetes mellitus associated with hormonal etiology ([place])   [place], Karen, NP   11 months ago Annual physical exam   [facility] Jon Billings, NP   1 year ago Hypertension associated with diabetes [facility])   [facility] [employer] [employer], [place]   1 year ago Annual physical exam   [facility] [employer], [place]      Future Appointments            In 3 weeks Jon Billings, NP [facility], [place]   In 6 months  [employer], [place]

## 2021-07-25 DIAGNOSIS — H353112 Nonexudative age-related macular degeneration, right eye, intermediate dry stage: Secondary | ICD-10-CM | POA: Diagnosis not present

## 2021-07-25 DIAGNOSIS — H43821 Vitreomacular adhesion, right eye: Secondary | ICD-10-CM | POA: Diagnosis not present

## 2021-07-25 DIAGNOSIS — H43393 Other vitreous opacities, bilateral: Secondary | ICD-10-CM | POA: Diagnosis not present

## 2021-07-25 DIAGNOSIS — H43813 Vitreous degeneration, bilateral: Secondary | ICD-10-CM | POA: Diagnosis not present

## 2021-07-25 DIAGNOSIS — H353221 Exudative age-related macular degeneration, left eye, with active choroidal neovascularization: Secondary | ICD-10-CM | POA: Diagnosis not present

## 2021-08-09 ENCOUNTER — Ambulatory Visit (INDEPENDENT_AMBULATORY_CARE_PROVIDER_SITE_OTHER): Payer: Medicare Other | Admitting: Nurse Practitioner

## 2021-08-09 ENCOUNTER — Other Ambulatory Visit: Payer: Self-pay

## 2021-08-09 ENCOUNTER — Encounter: Payer: Self-pay | Admitting: Nurse Practitioner

## 2021-08-09 VITALS — BP 115/66 | HR 80 | Temp 97.9°F | Ht 68.5 in | Wt 211.0 lb

## 2021-08-09 DIAGNOSIS — E1169 Type 2 diabetes mellitus with other specified complication: Secondary | ICD-10-CM | POA: Diagnosis not present

## 2021-08-09 DIAGNOSIS — Z0000 Encounter for general adult medical examination without abnormal findings: Secondary | ICD-10-CM | POA: Diagnosis not present

## 2021-08-09 DIAGNOSIS — E782 Mixed hyperlipidemia: Secondary | ICD-10-CM

## 2021-08-09 DIAGNOSIS — E118 Type 2 diabetes mellitus with unspecified complications: Secondary | ICD-10-CM

## 2021-08-09 DIAGNOSIS — I152 Hypertension secondary to endocrine disorders: Secondary | ICD-10-CM

## 2021-08-09 DIAGNOSIS — E1159 Type 2 diabetes mellitus with other circulatory complications: Secondary | ICD-10-CM

## 2021-08-09 LAB — URINALYSIS, ROUTINE W REFLEX MICROSCOPIC
Bilirubin, UA: NEGATIVE
Glucose, UA: NEGATIVE
Ketones, UA: NEGATIVE
Leukocytes,UA: NEGATIVE
Nitrite, UA: NEGATIVE
Protein,UA: NEGATIVE
RBC, UA: NEGATIVE
Specific Gravity, UA: 1.02 (ref 1.005–1.030)
Urobilinogen, Ur: 4 mg/dL — ABNORMAL HIGH (ref 0.2–1.0)
pH, UA: 6 (ref 5.0–7.5)

## 2021-08-09 MED ORDER — METFORMIN HCL 500 MG PO TABS: 500.0000 mg | ORAL_TABLET | Freq: Every day | ORAL | 1 refills | Status: DC

## 2021-08-09 MED ORDER — ATORVASTATIN CALCIUM 80 MG PO TABS: 80.0000 mg | ORAL_TABLET | Freq: Every day | ORAL | 1 refills | Status: DC

## 2021-08-09 MED ORDER — DILTIAZEM HCL ER COATED BEADS 240 MG PO CP24: 240.0000 mg | ORAL_CAPSULE | Freq: Every day | ORAL | 1 refills | Status: DC

## 2021-08-09 NOTE — Assessment & Plan Note (Signed)
Chronic.  Controlled.  Continue with current medication regimen of Metformin 500mg  daily.  Refill sent today.  Labs ordered today.  Return to clinic in 6 months for reevaluation.  Call sooner if concerns arise.  ? ?

## 2021-08-09 NOTE — Assessment & Plan Note (Signed)
Chronic.  Controlled.  Continue with current medication regimen of Atorvastatin 80mg  daily.  Refill sent today.  Labs ordered today.  Return to clinic in 6 months for reevaluation.  Call sooner if concerns arise.  ? ?

## 2021-08-09 NOTE — Assessment & Plan Note (Signed)
Chronic.  Controlled.  Continue with current medication regimen of Carvedilol 240mg  daily.  Refill sent today. Labs ordered today.  Return to clinic in 6 months for reevaluation.  Call sooner if concerns arise.  ? ?

## 2021-08-09 NOTE — Progress Notes (Signed)
BP 115/66    Pulse 80    Temp 97.9 F (36.6 C) (Oral)    Ht 5' 8.5" (1.74 m)    Wt 211 lb (95.7 kg)    SpO2 96%    BMI 31.62 kg/m    Subjective:    Patient ID: Randy Harper, male    DOB: 1950/10/12, 71 y.o.   MRN: [id number]  HPI: Randy Harper is a 71 y.o. male presenting on 08/09/2021 for comprehensive medical examination. Current medical complaints include:none  He currently lives with: Interim Problems from his last visit: no  HYPERTENSION / HYPERLIPIDEMIA Satisfied with current treatment? yes Duration of hypertension: years BP monitoring frequency: not checking BP range:  BP medication side effects: no Past BP meds: carvedilol Duration of hyperlipidemia: years Cholesterol medication side effects: no Cholesterol supplements: none Past cholesterol medications: atorvastain (lipitor) Medication compliance: excellent compliance Aspirin: no Recent stressors: no Recurrent headaches: no Visual changes: no Palpitations: no Dyspnea: no Chest pain: no Lower extremity edema: no Dizzy/lightheaded: no  DIABETES Hypoglycemic episodes:no Polydipsia/polyuria: no Visual disturbance: no Chest pain: no Paresthesias: no Glucose Monitoring: no  Accucheck frequency: Not Checking  Fasting glucose:  Post prandial:  Evening:  Before meals: Taking Insulin?: no  Long acting insulin:  Short acting insulin: Blood Pressure Monitoring: not checking Retinal Examination: Up to Date Foot Exam: Up to Date Diabetic Education: Not Completed Pneumovax: Up to Date Influenza: Up to Date Aspirin: no  Depression Screen done today and results listed below:  Depression screen [facility] 2/9 08/09/2021 03/02/2021 01/20/2021 01/18/2020 01/14/2019  Decreased Interest 0 0 0 0 0  Down, Depressed, Hopeless 0 0 0 0 0  PHQ - 2 Score 0 0 0 0 0  Altered sleeping 0 - - - -  Tired, decreased energy 0 - - - -  Change in appetite 0 - - - -  Feeling bad or failure about yourself  0 - - - -  Trouble concentrating 0  - - - -  Moving slowly or fidgety/restless 0 - - - -  Suicidal thoughts 0 - - - -  PHQ-9 Score 0 - - - -  Difficult doing work/chores Not difficult at all - - - -    The patient does not have a history of falls. I did complete a risk assessment for falls. A plan of care for falls was documented.   Past Medical History:  Past Medical History:  Diagnosis Date   Blood transfusion without reported diagnosis 01/2017   Cancer [facility]) 01/2017   metastic neuroendocrine - liver   Dysrhythmia    supraventricular Tach   Glaucoma 2015   Hyperlipidemia    Hypertension    Muscle strain of chest wall    PE (pulmonary thromboembolism) ([place])     Surgical History:  Past Surgical History:  Procedure Laterality Date   CHOLECYSTECTOMY  01/2017   related to liver cancer   COLONOSCOPY     COLONOSCOPY WITH PROPOFOL N/A 03/17/2019   Procedure: COLONOSCOPY WITH PROPOFOL;  Surgeon: Lollie Sails, MD;  Location: [facility] ENDOSCOPY;  Service: Endoscopy;  Laterality: N/A;   ESOPHAGOGASTRODUODENOSCOPY N/A 06/06/2021   Procedure: ESOPHAGOGASTRODUODENOSCOPY (EGD);  Surgeon: Virgel Manifold, MD;  Location: [facility] ENDOSCOPY;  Service: Endoscopy;  Laterality: N/A;   LAPAROSCOPY ABDOMEN DIAGNOSTIC     resection liver total right lobe  12/2016   SMALL INTESTINE SURGERY  01/2017   cancer surgery   TENDON GRAFT Right    right thumb tendon graft at age  8     Medications:  Current Outpatient Medications on File Prior to Visit  Medication Sig   acetaminophen (TYLENOL) 500 MG tablet Take 500 mg by mouth every 6 (six) hours as needed for mild pain, moderate pain or headache.   Aflibercept 2 MG/0.05ML SOLN Apply to eye. Every 6 weeks injection   brimonidine (ALPHAGAN) 0.2 % ophthalmic solution Place 1 drop into the left eye daily.    Multiple Vitamin (MULTIVITAMIN) tablet Take 1 tablet by mouth daily.   pantoprazole (PROTONIX) 40 MG tablet Take 1 tablet (40 mg total) by mouth daily for 28 days.   No current  facility-administered medications on file prior to visit.    Allergies:  No Known Allergies  Social History:  Social History   Socioeconomic History   Marital status: Married    Spouse name: Not on file   Number of children: Not on file   Years of education: Not on file   Highest education level: Master's degree (e.g., MA, MS, MEng, MEd, MSW, MBA)  Occupational History   Occupation: retired  Tobacco Use   Smoking status: Never   Smokeless tobacco: Never  Vaping Use   Vaping Use: Never used  Substance and Sexual Activity   Alcohol use: No   Drug use: No   Sexual activity: Yes    Birth control/protection: None  Other Topics Concern   Not on file  Social History Narrative   Not on file   Social Determinants of Health   Financial Resource Strain: Low Risk    Difficulty of Paying Living Expenses: Not hard at all  Food Insecurity: No Food Insecurity   Worried About Charity fundraiser in the Last Year: Never true   [place] in the Last Year: Never true  Transportation Needs: No Transportation Needs   Lack of Transportation (Medical): No   Lack of Transportation (Non-Medical): No  Physical Activity: Sufficiently Active   Days of Exercise per Week: 6 days   Minutes of Exercise per Session: 30 min  Stress: No Stress Concern Present   Feeling of Stress : Not at all  Social Connections: Not on file  Intimate Partner Violence: Not on file   Social History   Tobacco Use  Smoking Status Never  Smokeless Tobacco Never   Social History   Substance and Sexual Activity  Alcohol Use No    Family History:  Family History  Problem Relation Age of Onset   Heart disease Father    Diabetes Maternal Uncle    Diabetes Maternal Uncle    Diabetes Maternal Uncle    Diabetes Maternal Uncle    Diabetes Maternal Grandmother     Past medical history, surgical history, medications, allergies, family history and social history reviewed with patient today and changes made  to appropriate areas of the chart.   Review of Systems  HENT:         Denies vision changes.  Eyes:  Negative for blurred vision and double vision.  Respiratory:  Negative for shortness of breath.   Cardiovascular:  Negative for chest pain, palpitations and leg swelling.  Neurological:  Negative for dizziness, tingling and headaches.  Endo/Heme/Allergies:  Negative for polydipsia.       Denies Polyuria  All other ROS negative except what is listed above and in the HPI.      Objective:    BP 115/66    Pulse 80    Temp 97.9 F (36.6 C) (Oral)  Ht 5' 8.5" (1.74 m)    Wt 211 lb (95.7 kg)    SpO2 96%    BMI 31.62 kg/m   Wt Readings from Last 3 Encounters:  08/09/21 211 lb (95.7 kg)  06/06/21 210 lb (95.3 kg)  03/02/21 213 lb 12.8 oz (97 kg)    Physical Exam Vitals and nursing note reviewed.  Constitutional:      General: He is not in acute distress.    Appearance: Normal appearance. He is normal weight. He is not ill-appearing, toxic-appearing or diaphoretic.  HENT:     Head: Normocephalic.     Right Ear: Tympanic membrane, ear canal and external ear normal.     Left Ear: Tympanic membrane, ear canal and external ear normal.     Nose: Nose normal. No congestion or rhinorrhea.     Mouth/Throat:     Mouth: Mucous membranes are moist.  Eyes:     General:        Right eye: No discharge.        Left eye: No discharge.     Extraocular Movements: Extraocular movements intact.     Conjunctiva/sclera: Conjunctivae normal.     Pupils: Pupils are equal, round, and reactive to light.  Cardiovascular:     Rate and Rhythm: Normal rate and regular rhythm.     Heart sounds: No murmur heard. Pulmonary:     Effort: Pulmonary effort is normal. No respiratory distress.     Breath sounds: Normal breath sounds. No wheezing, rhonchi or rales.  Abdominal:     General: Abdomen is flat. Bowel sounds are normal. There is no distension.     Palpations: Abdomen is soft.     Tenderness: There is  no abdominal tenderness. There is no guarding.  Musculoskeletal:     Cervical back: Normal range of motion and neck supple.  Skin:    General: Skin is warm and dry.     Capillary Refill: Capillary refill takes less than 2 seconds.  Neurological:     General: No focal deficit present.     Mental Status: He is alert and oriented to person, place, and time.     Cranial Nerves: No cranial nerve deficit.     Motor: No weakness.     Deep Tendon Reflexes: Reflexes normal.  Psychiatric:        Mood and Affect: Mood normal.        Behavior: Behavior normal.        Thought Content: Thought content normal.        Judgment: Judgment normal.    Results for orders placed or performed during the hospital encounter of 06/06/21  Resp Panel by RT-PCR (Flu A&B, Covid) Nasopharyngeal Swab   Specimen: Nasopharyngeal Swab; Nasopharyngeal(NP) swabs in vial transport medium  Result Value Ref Range   SARS Coronavirus 2 by RT PCR NEGATIVE NEGATIVE   Influenza A by PCR NEGATIVE NEGATIVE   Influenza B by PCR NEGATIVE NEGATIVE  Basic metabolic panel  Result Value Ref Range   Sodium 139 135 - 145 mmol/L   Potassium 3.8 3.5 - 5.1 mmol/L   Chloride 102 98 - 111 mmol/L   CO2 27 22 - 32 mmol/L   Glucose, Bld 151 (H) 70 - 99 mg/dL   BUN 12 8 - 23 mg/dL   Creatinine, Ser 0.83 0.61 - 1.24 mg/dL   Calcium 9.1 8.9 - 10.3 mg/dL   GFR, Estimated >60 >60 mL/min   Anion gap 10 5 - 15  CBC  Result Value Ref Range   WBC 11.5 (H) 4.0 - 10.5 K/uL   RBC 4.67 4.22 - 5.81 MIL/uL   Hemoglobin 14.8 13.0 - 17.0 g/dL   HCT 42.8 39.0 - 52.0 %   MCV 91.6 80.0 - 100.0 fL   MCH 31.7 26.0 - 34.0 pg   MCHC 34.6 30.0 - 36.0 g/dL   RDW 11.8 11.5 - 15.5 %   Platelets 163 150 - 400 K/uL   nRBC 0.0 0.0 - 0.2 %  CBG monitoring, ED  Result Value Ref Range   Glucose-Capillary 130 (H) 70 - 99 mg/dL  CBG monitoring, ED  Result Value Ref Range   Glucose-Capillary 114 (H) 70 - 99 mg/dL  CBG monitoring, ED  Result Value Ref Range    Glucose-Capillary 140 (H) 70 - 99 mg/dL  Troponin I (High Sensitivity)  Result Value Ref Range   Troponin I (High Sensitivity) 4 <18 ng/L  Troponin I (High Sensitivity)  Result Value Ref Range   Troponin I (High Sensitivity) 4 <18 ng/L      Assessment & Plan:   Problem List Items Addressed This Visit       Cardiovascular and Mediastinum   Hypertension associated with diabetes (HCC)    Chronic.  Controlled.  Continue with current medication regimen of Carvedilol 240mg  daily.  Refill sent today. Labs ordered today.  Return to clinic in 6 months for reevaluation.  Call sooner if concerns arise.        Relevant Medications   atorvastatin (LIPITOR) 80 MG tablet   diltiazem (CARDIZEM CD) 240 MG 24 hr capsule   metFORMIN (GLUCOPHAGE) 500 MG tablet     Endocrine   Controlled diabetes mellitus type 2 with complications (HCC)    Chronic.  Controlled.  Continue with current medication regimen of Metformin 500mg  daily.  Refill sent today.  Labs ordered today.  Return to clinic in 6 months for reevaluation.  Call sooner if concerns arise.        Relevant Medications   atorvastatin (LIPITOR) 80 MG tablet   metFORMIN (GLUCOPHAGE) 500 MG tablet     Other   Hyperlipidemia    Chronic.  Controlled.  Continue with current medication regimen of Atorvastatin 80mg  daily.  Refill sent today.  Labs ordered today.  Return to clinic in 6 months for reevaluation.  Call sooner if concerns arise.        Relevant Medications   atorvastatin (LIPITOR) 80 MG tablet   diltiazem (CARDIZEM CD) 240 MG 24 hr capsule   Other Relevant Orders   Lipid panel   Other Visit Diagnoses     Annual physical exam    -  Primary   Health maintenance reviewed during visit today. Labs ordered. Up to date on Vaccines and Colonoscopy.   Relevant Orders   TSH   PSA   Lipid panel   CBC with Differential/Platelet   Comprehensive metabolic panel   Urinalysis, Routine w reflex microscopic   HgB A1c   Diabetes  mellitus associated with hormonal etiology ([place])       Relevant Medications   atorvastatin (LIPITOR) 80 MG tablet   metFORMIN (GLUCOPHAGE) 500 MG tablet   Other Relevant Orders   HgB A1c        Discussed aspirin prophylaxis for myocardial infarction prevention and decision was it was not indicated  LABORATORY TESTING:  Health maintenance labs ordered today as discussed above.   The natural history of prostate cancer and ongoing controversy regarding screening and  potential treatment outcomes of prostate cancer has been discussed with the patient. The meaning of a false positive PSA and a false negative PSA has been discussed. He indicates understanding of the limitations of this screening test and wishes to proceed with screening PSA testing.   IMMUNIZATIONS:   - Tdap: Tetanus vaccination status reviewed: last tetanus booster within 10 years. - Influenza: Up to date - Pneumovax: Up to date - Prevnar: Up to date - COVID: Up to date - HPV: Not applicable - Shingrix vaccine: Up to date  SCREENING: - Colonoscopy: Up to date  Discussed with patient purpose of the colonoscopy is to detect colon cancer at curable precancerous or early stages   - AAA Screening: Not applicable  -Hearing Test: Not applicable  -Spirometry: Not applicable   PATIENT COUNSELING:    Sexuality: Discussed sexually transmitted diseases, partner selection, use of condoms, avoidance of unintended pregnancy  and contraceptive alternatives.   Advised to avoid cigarette smoking.  I discussed with the patient that most people either abstain from alcohol or drink within safe limits (<=14/week and <=4 drinks/occasion for males, <=7/weeks and <= 3 drinks/occasion for females) and that the risk for alcohol disorders and other health effects rises proportionally with the number of drinks per week and how often a drinker exceeds daily limits.  Discussed cessation/primary prevention of drug use and availability of  treatment for abuse.   Diet: Encouraged to adjust caloric intake to maintain  or achieve ideal body weight, to reduce intake of dietary saturated fat and total fat, to limit sodium intake by avoiding high sodium foods and not adding table salt, and to maintain adequate dietary potassium and calcium preferably from fresh fruits, vegetables, and low-fat dairy products.    stressed the importance of regular exercise  Injury prevention: Discussed safety belts, safety helmets, smoke detector, smoking near bedding or upholstery.   Dental health: Discussed importance of regular tooth brushing, flossing, and dental visits.   Follow up plan: NEXT PREVENTATIVE PHYSICAL DUE IN 1 YEAR. Return in about 6 months (around 02/09/2022) for HTN, HLD, DM2 FU.

## 2021-08-10 LAB — COMPREHENSIVE METABOLIC PANEL
ALT: 22 IU/L (ref 0–44)
AST: 25 IU/L (ref 0–40)
Albumin/Globulin Ratio: 1.8 (ref 1.2–2.2)
Albumin: 4.4 g/dL (ref 3.7–4.7)
Alkaline Phosphatase: 110 IU/L (ref 44–121)
BUN/Creatinine Ratio: 16 (ref 10–24)
BUN: 13 mg/dL (ref 8–27)
Bilirubin Total: 0.5 mg/dL (ref 0.0–1.2)
CO2: 24 mmol/L (ref 20–29)
Calcium: 9.2 mg/dL (ref 8.6–10.2)
Chloride: 99 mmol/L (ref 96–106)
Creatinine, Ser: 0.81 mg/dL (ref 0.76–1.27)
Globulin, Total: 2.5 g/dL (ref 1.5–4.5)
Glucose: 105 mg/dL — ABNORMAL HIGH (ref 70–99)
Potassium: 4 mmol/L (ref 3.5–5.2)
Sodium: 139 mmol/L (ref 134–144)
Total Protein: 6.9 g/dL (ref 6.0–8.5)
eGFR: 94 mL/min/{1.73_m2} (ref >59)

## 2021-08-10 LAB — HEMOGLOBIN A1C
Est. average glucose Bld gHb Est-mCnc: 114 mg/dL
Hgb A1c MFr Bld: 5.6 % (ref 4.8–5.6)

## 2021-08-10 LAB — PSA: Prostate Specific Ag, Serum: 0.8 ng/mL (ref 0.0–4.0)

## 2021-08-10 LAB — CBC WITH DIFFERENTIAL/PLATELET
Basophils Absolute: 0 10*3/uL (ref 0.0–0.2)
Basos: 1 %
EOS (ABSOLUTE): 0.1 10*3/uL (ref 0.0–0.4)
Eos: 1 %
Hematocrit: 44.4 % (ref 37.5–51.0)
Hemoglobin: 14.8 g/dL (ref 13.0–17.7)
Immature Grans (Abs): 0 10*3/uL (ref 0.0–0.1)
Immature Granulocytes: 0 %
Lymphocytes Absolute: 1.1 10*3/uL (ref 0.7–3.1)
Lymphs: 25 %
MCH: 30.5 pg (ref 26.6–33.0)
MCHC: 33.3 g/dL (ref 31.5–35.7)
MCV: 91 fL (ref 79–97)
Monocytes Absolute: 0.6 10*3/uL (ref 0.1–0.9)
Monocytes: 14 %
Neutrophils Absolute: 2.7 10*3/uL (ref 1.4–7.0)
Neutrophils: 59 %
Platelets: 175 10*3/uL (ref 150–450)
RBC: 4.86 x10E6/uL (ref 4.14–5.80)
RDW: 11.9 % (ref 11.6–15.4)
WBC: 4.5 10*3/uL (ref 3.4–10.8)

## 2021-08-10 LAB — LIPID PANEL
Chol/HDL Ratio: 3.7 ratio (ref 0.0–5.0)
Cholesterol, Total: 162 mg/dL (ref 100–199)
HDL: 44 mg/dL (ref >39)
LDL Chol Calc (NIH): 98 mg/dL (ref 0–99)
Triglycerides: 107 mg/dL (ref 0–149)
VLDL Cholesterol Cal: 20 mg/dL (ref 5–40)

## 2021-08-10 LAB — TSH: TSH: 0.89 u[IU]/mL (ref 0.450–4.500)

## 2021-08-10 NOTE — Progress Notes (Signed)
Hi Randy Harper.  Your lab work looks great.  A1c is well controlled.  At 5.6.  Cholesterol, liver, kidneys, electrolytes and prostate look great. Continue with your current medication regimen.  Follow up as discussed.

## 2021-08-21 ENCOUNTER — Encounter: Payer: Self-pay | Admitting: *Deleted

## 2021-08-22 ENCOUNTER — Ambulatory Visit: Payer: Medicare Other | Admitting: Anesthesiology

## 2021-08-22 ENCOUNTER — Encounter: Payer: Self-pay | Admitting: *Deleted

## 2021-08-22 ENCOUNTER — Encounter
Admission: RE | Disposition: A | Discharge status: Home / Self Care | Payer: Self-pay | Source: Home / Self Care | Attending: Gastroenterology

## 2021-08-22 ENCOUNTER — Other Ambulatory Visit: Payer: Self-pay

## 2021-08-22 ENCOUNTER — Ambulatory Visit
Admission: RE | Admit: 2021-08-22 | Discharge: 2021-08-22 | Disposition: A | Discharge status: Home / Self Care | Payer: Medicare Other | Attending: Gastroenterology | Admitting: Gastroenterology

## 2021-08-22 DIAGNOSIS — K2100 Gastro-esophageal reflux disease with esophagitis, without bleeding: Secondary | ICD-10-CM | POA: Insufficient documentation

## 2021-08-22 DIAGNOSIS — K2960 Other gastritis without bleeding: Secondary | ICD-10-CM | POA: Insufficient documentation

## 2021-08-22 DIAGNOSIS — Z859 Personal history of malignant neoplasm, unspecified: Secondary | ICD-10-CM | POA: Diagnosis not present

## 2021-08-22 DIAGNOSIS — Z87821 Personal history of retained foreign body fully removed: Secondary | ICD-10-CM | POA: Diagnosis not present

## 2021-08-22 DIAGNOSIS — E119 Type 2 diabetes mellitus without complications: Secondary | ICD-10-CM | POA: Insufficient documentation

## 2021-08-22 DIAGNOSIS — I10 Essential (primary) hypertension: Secondary | ICD-10-CM | POA: Insufficient documentation

## 2021-08-22 DIAGNOSIS — I471 Supraventricular tachycardia: Secondary | ICD-10-CM | POA: Diagnosis not present

## 2021-08-22 DIAGNOSIS — K2900 Acute gastritis without bleeding: Secondary | ICD-10-CM | POA: Diagnosis not present

## 2021-08-22 DIAGNOSIS — K222 Esophageal obstruction: Secondary | ICD-10-CM | POA: Diagnosis not present

## 2021-08-22 DIAGNOSIS — T18128A Food in esophagus causing other injury, initial encounter: Secondary | ICD-10-CM | POA: Diagnosis not present

## 2021-08-22 DIAGNOSIS — K209 Esophagitis, unspecified without bleeding: Secondary | ICD-10-CM | POA: Diagnosis not present

## 2021-08-22 HISTORY — DX: Supraventricular tachycardia: I47.1

## 2021-08-22 HISTORY — PX: ESOPHAGOGASTRODUODENOSCOPY (EGD) WITH PROPOFOL: SHX5813

## 2021-08-22 HISTORY — DX: Supraventricular tachycardia, unspecified: I47.10

## 2021-08-22 LAB — GLUCOSE, CAPILLARY: Glucose-Capillary: 107 mg/dL — ABNORMAL HIGH (ref 70–99)

## 2021-08-22 SURGERY — ESOPHAGOGASTRODUODENOSCOPY (EGD) WITH PROPOFOL
Anesthesia: General

## 2021-08-22 MED ORDER — SODIUM CHLORIDE 0.9 % IV SOLN: INTRAVENOUS | Status: DC

## 2021-08-22 MED ORDER — LIDOCAINE HCL (CARDIAC) PF 100 MG/5ML IV SOSY
PREFILLED_SYRINGE | INTRAVENOUS | Status: DC | PRN
  Administered 2021-08-22: 100 mg via INTRAVENOUS

## 2021-08-22 MED ORDER — PROPOFOL 10 MG/ML IV BOLUS
INTRAVENOUS | Status: DC | PRN
Start: 2021-08-22 — End: 2021-08-22
  Administered 2021-08-22: 40 mg via INTRAVENOUS
  Administered 2021-08-22: 70 mg via INTRAVENOUS
  Administered 2021-08-22: 20 mg via INTRAVENOUS
  Administered 2021-08-22: 70 mg via INTRAVENOUS

## 2021-08-22 MED ORDER — PROPOFOL 10 MG/ML IV BOLUS
INTRAVENOUS | Status: AC
  Filled 2021-08-22: qty 20

## 2021-08-22 NOTE — Anesthesia Postprocedure Evaluation (Signed)
Anesthesia Post Note ? ?Patient: Randy Harper ? ?Procedure(s) Performed: ESOPHAGOGASTRODUODENOSCOPY (EGD) WITH PROPOFOL ? ?Patient location during evaluation: PACU ?Anesthesia Type: General ?Level of consciousness: awake and alert, oriented and patient cooperative ?Pain management: pain level controlled ?Vital Signs Assessment: post-procedure vital signs reviewed and stable ?Respiratory status: spontaneous breathing, nonlabored ventilation and respiratory function stable ?Cardiovascular status: blood pressure returned to baseline and stable ?Postop Assessment: adequate PO intake ?Anesthetic complications: no ? ? ?No notable events documented. ? ? ?Last Vitals:  ?Vitals:  ? 08/22/21 0920 08/22/21 0930  ?BP: 133/71 130/69  ?Pulse: 78 78  ?Resp: 15 16  ?Temp:    ?SpO2: 96% 96%  ?  ?Last Pain:  ?Vitals:  ? 08/22/21 0901  ?TempSrc:   ?PainSc: Asleep  ? ? ?  ?  ?  ?  ?  ?  ? ?Darrin Nipper ? ? ? ? ?

## 2021-08-22 NOTE — Transfer of Care (Signed)
Immediate Anesthesia Transfer of Care Note ? ?Patient: Randy Harper ? ?Procedure(s) Performed: ESOPHAGOGASTRODUODENOSCOPY (EGD) WITH PROPOFOL ? ?Patient Location: Endoscopy Unit ? ?Anesthesia Type:General ? ?Level of Consciousness: awake ? ?Airway & Oxygen Therapy: Patient Spontanous Breathing ? ?Post-op Assessment: Report given to RN and Post -op Vital signs reviewed and stable ? ?Post vital signs: Reviewed and stable ? ?Last Vitals:  ?Vitals Value Taken Time  ?BP 127/75 08/22/21 0901  ?Temp    ?Pulse 94 08/22/21 0902  ?Resp 13 08/22/21 0902  ?SpO2 95 % 08/22/21 0902  ? ? ?Last Pain:  ?Vitals:  ? 08/22/21 0901  ?TempSrc:   ?PainSc: Asleep  ?   ? ?  ? ?Complications: No notable events documented. ?

## 2021-08-22 NOTE — Op Note (Signed)
[facility] ?Gastroenterology ?Patient Name: Randy Harper ?Procedure Date: 08/22/2021 8:27 AM ?MRN: [id number] ?Account #: [id number] ?Date of Birth: September 27, 1950 ?Admit Type: Outpatient ?Age: 71 ?Room: [facility] ENDO ROOM 1 ?Gender: Male ?Note Status: Finalized ?Instrument Name: Upper Endoscope [id number] ?Procedure:             Upper GI endoscopy ?Indications:           Reflux esophagitis, History of food impaction ?Providers:             Andrey Farmer MD, MD ?Referring MD:          Jon Billings (Referring MD) ?Medicines:             Monitored Anesthesia Care ?Complications:         No immediate complications. Estimated blood loss:  ?                       Minimal. ?Procedure:             Pre-Anesthesia Assessment: ?                       - Prior to the procedure, a History and Physical was  ?                       performed, and patient medications and allergies were  ?                       reviewed. The patient is competent. The risks and  ?                       benefits of the procedure and the sedation options and  ?                       risks were discussed with the patient. All questions  ?                       were answered and informed consent was obtained.  ?                       Patient identification and proposed procedure were  ?                       verified by the physician, the nurse, the  ?                       anesthesiologist, the anesthetist and the technician  ?                       in the endoscopy suite. Mental Status Examination:  ?                       alert and oriented. Airway Examination: normal  ?                       oropharyngeal airway and neck mobility. Respiratory  ?                       Examination: clear to auscultation. CV Examination:  ?  normal. Prophylactic Antibiotics: The patient does not  ?                       require prophylactic antibiotics. Prior  ?                       Anticoagulants: The patient has taken no previous  ?                        anticoagulant or antiplatelet agents. ASA Grade  ?                       Assessment: II - A patient with mild systemic disease.  ?                       After reviewing the risks and benefits, the patient  ?                       was deemed in satisfactory condition to undergo the  ?                       procedure. The anesthesia plan was to use monitored  ?                       anesthesia care (MAC). Immediately prior to  ?                       administration of medications, the patient was  ?                       re-assessed for adequacy to receive sedatives. The  ?                       heart rate, respiratory rate, oxygen saturations,  ?                       blood pressure, adequacy of pulmonary ventilation, and  ?                       response to care were monitored throughout the  ?                       procedure. The physical status of the patient was  ?                       re-assessed after the procedure. ?                       After obtaining informed consent, the endoscope was  ?                       passed under direct vision. Throughout the procedure,  ?                       the patient's blood pressure, pulse, and oxygen  ?                       saturations were monitored continuously. The Endoscope  ?  was introduced through the mouth, and advanced to the  ?                       second part of duodenum. The upper GI endoscopy was  ?                       accomplished without difficulty. The patient tolerated  ?                       the procedure well. ?Findings: ?     The examined esophagus was normal. A TTS dilator was passed through the  ?     scope. Dilation with a 15-16.5-18 mm balloon dilator was performed to 18  ?     mm. The dilation site was examined and showed no change. Estimated blood  ?     loss: none. Biopsies were obtained from the proximal and distal  ?     esophagus with cold forceps for histology of suspected eosinophilic  ?      esophagitis. Estimated blood loss was minimal. ?     Localized mildly erythematous mucosa without bleeding was found in the  ?     gastric fundus. Biopsies were taken with a cold forceps for histology.  ?     Estimated blood loss was minimal. ?     The exam of the stomach was otherwise normal. ?     The examined duodenum was normal. ?Impression:            - Normal esophagus. Dilated. Biopsied. ?                       - Erythematous mucosa in the gastric fundus. Biopsied. ?                       - Normal examined duodenum. ?Recommendation:        - Discharge patient to home. ?                       - Resume previous diet. ?                       - Continue present medications. ?                       - Await pathology results. ?                       - Return to referring physician as previously  ?                       scheduled. ?Procedure Code(s):     --- Professional --- ?                       [phone], Esophagogastroduodenoscopy, flexible,  ?                       transoral; with transendoscopic balloon dilation of  ?                       esophagus (less than 30 mm diameter) ?  [id number], 59, Esophagogastroduodenoscopy, flexible,  ?                       transoral; with biopsy, single or multiple ?Diagnosis Code(s):     --- Professional --- ?                       K31.89, Other diseases of stomach and duodenum ?                       K21.00, Gastro-esophageal reflux disease with  ?                       esophagitis, without bleeding ?CPT copyright 2019 American Medical Association. All rights reserved. ?The codes documented in this report are preliminary and upon coder review may  ?be revised to meet current compliance requirements. ?Andrey Farmer MD, MD ?08/22/2021 8:59:30 AM ?Number of Addenda: 0 ?Note Initiated On: 08/22/2021 8:27 AM ?Estimated Blood Loss:  Estimated blood loss was minimal. ?     [facility] ?

## 2021-08-22 NOTE — Interval H&P Note (Signed)
History and Physical Interval Note: ? ?08/22/2021 ?8:35 AM ? ?Randy Harper  has presented today for surgery, with the diagnosis of K20.90 - Esophagitis ?K22.2, [id number] - Esophageal obstruction due to food impaction.  The various methods of treatment have been discussed with the patient and family. After consideration of risks, benefits and other options for treatment, the patient has consented to  Procedure(s) with comments: ?ESOPHAGOGASTRODUODENOSCOPY (EGD) WITH PROPOFOL (N/A) - DM as a surgical intervention.  The patient's history has been reviewed, patient examined, no change in status, stable for surgery.  I have reviewed the patient's chart and labs.  Questions were answered to the patient's satisfaction.   ? ? ?Hilton Cork Malea Swilling ? ?Ok to proceed with EGD ?

## 2021-08-22 NOTE — H&P (Signed)
Outpatient short stay form Pre-procedure ?08/22/2021  ?Lesly Rubenstein, MD ? ?Primary Physician: Jon Billings, NP ? ?Reason for visit:  History of food impaction ? ?History of present illness:   ? 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 No blood thinners. No family history of GI malignancies. History of small bowel resection and partial hepatectomy. No further troubles with swallowing. ? ? ? ?Current Facility-Administered Medications:  ?  0.9 %  sodium chloride infusion, , Intravenous, Continuous, Danasha Melman, Hilton Cork, MD, Last Rate: 20 mL/hr at 08/22/21 0819, New Bag at 08/22/21 0819 ? ?Medications Prior to Admission  ?Medication Sig Dispense Refill Last Dose  ? atorvastatin (LIPITOR) 80 MG tablet Take 1 tablet (80 mg total) by mouth daily. 90 tablet 1 08/21/2021  ? diltiazem (CARDIZEM CD) 240 MG 24 hr capsule Take 1 capsule (240 mg total) by mouth daily. 90 capsule 1 08/21/2021  ? metFORMIN (GLUCOPHAGE) 500 MG tablet Take 1 tablet (500 mg total) by mouth daily with breakfast. 90 tablet 1 08/21/2021  ? Multiple Vitamin (MULTIVITAMIN) tablet Take 1 tablet by mouth daily.   08/21/2021  ? acetaminophen (TYLENOL) 500 MG tablet Take 500 mg by mouth every 6 (six) hours as needed for mild pain, moderate pain or headache.     ? Aflibercept 2 MG/0.05ML SOLN Apply to eye. Every 6 weeks injection     ? brimonidine (ALPHAGAN) 0.2 % ophthalmic solution Place 1 drop into the left eye daily.   3   ? pantoprazole (PROTONIX) 40 MG tablet Take 1 tablet (40 mg total) by mouth daily for 28 days. 28 tablet 0   ? ? ? ?No Known Allergies ? ? ?Past Medical History:  ?Diagnosis Date  ? Blood transfusion without reported diagnosis 01/2017  ? Cancer ([place]) 01/2017  ? metastic neuroendocrine - liver  ? Diabetes mellitus without complication ([place])   ? Dysrhythmia   ? supraventricular Tach  ? Glaucoma 2015  ? Hyperlipidemia   ? Hypertension    ? Muscle strain of chest wall   ? PE (pulmonary thromboembolism) ([place])   ? Supraventricular tachycardia ([place])   ? ? ?Review of systems:  Otherwise negative.  ? ? ?Physical Exam ? ?Gen: Alert, oriented. Appears stated age.  ?HEENT: PERRLA. ?Lungs: No respiratory distress ?CV: RRR ?Abd: soft, benign, no masses ?Ext: No edema ? ? ? ?Planned procedures: Proceed with EGD. The patient understands the nature of the planned procedure, indications, risks, alternatives and potential complications including but not limited to bleeding, infection, perforation, damage to internal organs and possible oversedation/side effects from anesthesia. The patient agrees and gives consent to proceed.  ?Please refer to procedure notes for findings, recommendations and patient disposition/instructions.  ? ? ? ?Lesly Rubenstein, MD ?[facility] ? ? ? ?  ? ?

## 2021-08-22 NOTE — Anesthesia Preprocedure Evaluation (Addendum)
Anesthesia Evaluation  ?Patient identified by MRN, date of birth, ID band ?Patient awake ? ? ? ?Reviewed: ?Allergy & Precautions, NPO status , Patient's Chart, lab work & pertinent test results ? ?History of Anesthesia Complications ?Negative for: history of anesthetic complications ? ?Airway ?Mallampati: IV ? ? ?Neck ROM: Full ? ? ? Dental ?no notable dental hx. ? ?  ?Pulmonary ?neg pulmonary ROS,  ?  ?Pulmonary exam normal ?breath sounds clear to auscultation ? ? ? ? ? ? Cardiovascular ?hypertension, Normal cardiovascular exam+ dysrhythmias Supra Ventricular Tachycardia  ?Rhythm:Regular Rate:Normal ? ?ECG 06/07/21: Sinus tachycardia, RBBB ? ?Echo 10/04/20:  ?NORMAL LEFT VENTRICULAR SYSTOLIC FUNCTION  ?NORMAL RIGHT VENTRICULAR SYSTOLIC FUNCTION  ?MILD VALVULAR REGURGITATION  ?NO VALVULAR STENOSIS  ? ?Myocardial perfusion 08/24/20:  ?Normal treadmill EKG without evidence of myocardial ischemia with bundle branch block  ?Normal myocardial perfusion without evidence of myocardial ischemia  ?  ?Neuro/Psych ?negative neurological ROS ?   ? GI/Hepatic ?negative GI ROS, Metastatic neuroendocrine liver CA ?  ?Endo/Other  ?diabetes, Type 2 ? Renal/GU ?negative Renal ROS  ? ?  ?Musculoskeletal ? ? Abdominal ?  ?Peds ? Hematology ?Hx PE   ?Anesthesia Other Findings ? ? Reproductive/Obstetrics ? ?  ? ? ? ? ? ? ? ? ? ? ? ? ? ?  ?  ? ? ? ? ? ? ? ?Anesthesia Physical ?Anesthesia Plan ? ?ASA: 3 ? ?Anesthesia Plan: General  ? ?Post-op Pain Management:   ? ?Induction: Intravenous ? ?PONV Risk Score and Plan: 2 and Propofol infusion, TIVA and Treatment may vary due to age or medical condition ? ?Airway Management Planned: Natural Airway ? ?Additional Equipment:  ? ?Intra-op Plan:  ? ?Post-operative Plan:  ? ?Informed Consent: I have reviewed the patients History and Physical, chart, labs and discussed the procedure including the risks, benefits and alternatives for the proposed anesthesia with the  patient or authorized representative who has indicated his/her understanding and acceptance.  ? ? ? ? ? ?Plan Discussed with: CRNA ? ?Anesthesia Plan Comments: (LMA/GETA backup discussed.  Patient consented for risks of anesthesia including but not limited to:  ?- adverse reactions to medications ?- damage to eyes, teeth, lips or other oral mucosa ?- nerve damage due to positioning  ?- sore throat or hoarseness ?- damage to heart, brain, nerves, lungs, other parts of body or loss of life ? ?Informed patient about role of CRNA in peri- and intra-operative care.  Patient voiced understanding.)  ? ? ? ? ? ? ?Anesthesia Quick Evaluation ? ?

## 2021-08-23 ENCOUNTER — Encounter: Payer: Self-pay | Admitting: Gastroenterology

## 2021-08-23 LAB — SURGICAL PATHOLOGY

## 2021-09-19 ENCOUNTER — Other Ambulatory Visit: Payer: Self-pay | Admitting: Nurse Practitioner

## 2021-09-19 DIAGNOSIS — E1159 Type 2 diabetes mellitus with other circulatory complications: Secondary | ICD-10-CM

## 2021-09-19 NOTE — Telephone Encounter (Signed)
Requested medications are due for refill today.  no ? ?Requested medications are on the active medications list.  yes ? ?Last refill. 08/09/2021 #90 1 refill ? ?Future visit scheduled.   yes ? ?Notes to clinic.  Pharmacy is requesting a 1 year supply. ? ? ? ?Requested Prescriptions  ?Pending Prescriptions Disp Refills  ? diltiazem (CARDIZEM CD) 240 MG 24 hr capsule [Pharmacy Med Name: DILTIAZEM  '240MG'$   CAP  24 HR CD] 100 capsule 2  ?  Sig: TAKE 1 CAPSULE BY MOUTH DAILY  ?  ? Cardiovascular: Calcium Channel Blockers 3 Passed - 09/19/2021 12:07 AM  ?  ?  Passed - ALT in normal range and within 360 days  ?  ALT  ?Date Value Ref Range Status  ?08/09/2021 22 0 - 44 IU/L Final  ? ?ALT (SGPT) Piccolo, Waived  ?Date Value Ref Range Status  ?01/14/2019 26 10 - 47 U/L Final  ?  ?  ?  ?  Passed - AST in normal range and within 360 days  ?  AST  ?Date Value Ref Range Status  ?08/09/2021 25 0 - 40 IU/L Final  ? ?AST (SGOT) Piccolo, Waived  ?Date Value Ref Range Status  ?01/14/2019 32 11 - 38 U/L Final  ?  ?  ?  ?  Passed - Cr in normal range and within 360 days  ?  Creatinine, Ser  ?Date Value Ref Range Status  ?08/09/2021 0.81 0.76 - 1.27 mg/dL Final  ?  ?  ?  ?  Passed - Last BP in normal range  ?  BP Readings from Last 1 Encounters:  ?08/22/21 130/69  ?  ?  ?  ?  Passed - Last Heart Rate in normal range  ?  Pulse Readings from Last 1 Encounters:  ?08/22/21 78  ?  ?  ?  ?  Passed - Valid encounter within last 6 months  ?  Recent Outpatient Visits   ? ?      ? 1 month ago Annual physical exam  ? [place], NP  ? 6 months ago Right calf pain  ? [place], NP  ? 7 months ago Diabetes mellitus associated with hormonal etiology ([place])  ? [place], NP  ? 1 year ago Annual physical exam  ? [place], NP  ? 1 year ago Hypertension associated with diabetes [facility])  ? [place], [place], [place]  ? ?  ?  ?Future Appointments   ? ?        ? In 4 months  [facility], [facility]  ? In 4 months Jon Billings, NP [facility], [facility]  ? ?  ? ?  ?  ?  ?  ?

## 2021-09-20 DIAGNOSIS — H353112 Nonexudative age-related macular degeneration, right eye, intermediate dry stage: Secondary | ICD-10-CM | POA: Diagnosis not present

## 2021-09-20 DIAGNOSIS — H43393 Other vitreous opacities, bilateral: Secondary | ICD-10-CM | POA: Diagnosis not present

## 2021-09-20 DIAGNOSIS — H353221 Exudative age-related macular degeneration, left eye, with active choroidal neovascularization: Secondary | ICD-10-CM | POA: Diagnosis not present

## 2021-09-20 DIAGNOSIS — H43813 Vitreous degeneration, bilateral: Secondary | ICD-10-CM | POA: Diagnosis not present

## 2021-09-21 DIAGNOSIS — E782 Mixed hyperlipidemia: Secondary | ICD-10-CM | POA: Diagnosis not present

## 2021-09-21 DIAGNOSIS — I4892 Unspecified atrial flutter: Secondary | ICD-10-CM | POA: Diagnosis not present

## 2021-09-21 DIAGNOSIS — I10 Essential (primary) hypertension: Secondary | ICD-10-CM | POA: Diagnosis not present

## 2021-09-21 DIAGNOSIS — I700 Atherosclerosis of aorta: Secondary | ICD-10-CM | POA: Diagnosis not present

## 2021-09-22 ENCOUNTER — Other Ambulatory Visit: Payer: Self-pay | Admitting: Nurse Practitioner

## 2021-09-22 DIAGNOSIS — E1159 Type 2 diabetes mellitus with other circulatory complications: Secondary | ICD-10-CM

## 2021-09-22 NOTE — Telephone Encounter (Signed)
Medication was refilled 08/09/21, by PCP for 90, 1 refill. ? ?Requested Prescriptions  ?Pending Prescriptions Disp Refills  ?? diltiazem (CARDIZEM CD) 240 MG 24 hr capsule [Pharmacy Med Name: DILTIAZEM  '240MG'$   CAP  24 HR CD] 100 capsule 2  ?  Sig: TAKE 1 CAPSULE BY MOUTH DAILY  ?  ? Cardiovascular: Calcium Channel Blockers 3 Passed - 09/22/2021 11:35 AM  ?  ?  Passed - ALT in normal range and within 360 days  ?  ALT  ?Date Value Ref Range Status  ?08/09/2021 22 0 - 44 IU/L Final  ? ?ALT (SGPT) Piccolo, Waived  ?Date Value Ref Range Status  ?01/14/2019 26 10 - 47 U/L Final  ?   ?  ?  Passed - AST in normal range and within 360 days  ?  AST  ?Date Value Ref Range Status  ?08/09/2021 25 0 - 40 IU/L Final  ? ?AST (SGOT) Piccolo, Waived  ?Date Value Ref Range Status  ?01/14/2019 32 11 - 38 U/L Final  ?   ?  ?  Passed - Cr in normal range and within 360 days  ?  Creatinine, Ser  ?Date Value Ref Range Status  ?08/09/2021 0.81 0.76 - 1.27 mg/dL Final  ?   ?  ?  Passed - Last BP in normal range  ?  BP Readings from Last 1 Encounters:  ?08/22/21 130/69  ?   ?  ?  Passed - Last Heart Rate in normal range  ?  Pulse Readings from Last 1 Encounters:  ?08/22/21 78  ?   ?  ?  Passed - Valid encounter within last 6 months  ?  Recent Outpatient Visits   ?      ? 1 month ago Annual physical exam  ? [place], NP  ? 6 months ago Right calf pain  ? [place], NP  ? 7 months ago Diabetes mellitus associated with hormonal etiology ([place])  ? [place], NP  ? 1 year ago Annual physical exam  ? [place], NP  ? 1 year ago Hypertension associated with diabetes [facility])  ? [place], [place], [place]  ?  ?  ?Future Appointments   ?        ? In 4 months  [facility], [facility]  ? In 4 months Jon Billings, NP [facility], [facility]  ?  ? ?  ?  ?  ? ? ?

## 2021-10-06 DIAGNOSIS — M545 Low back pain, unspecified: Secondary | ICD-10-CM | POA: Diagnosis not present

## 2021-10-19 DIAGNOSIS — M545 Low back pain, unspecified: Secondary | ICD-10-CM | POA: Diagnosis not present

## 2021-10-24 DIAGNOSIS — Z9889 Other specified postprocedural states: Secondary | ICD-10-CM | POA: Diagnosis not present

## 2021-10-24 DIAGNOSIS — C7B8 Other secondary neuroendocrine tumors: Secondary | ICD-10-CM | POA: Diagnosis not present

## 2021-10-24 DIAGNOSIS — Z8719 Personal history of other diseases of the digestive system: Secondary | ICD-10-CM | POA: Diagnosis not present

## 2021-10-24 DIAGNOSIS — R748 Abnormal levels of other serum enzymes: Secondary | ICD-10-CM | POA: Diagnosis not present

## 2021-10-24 DIAGNOSIS — Z9049 Acquired absence of other specified parts of digestive tract: Secondary | ICD-10-CM | POA: Diagnosis not present

## 2021-10-24 DIAGNOSIS — C7A8 Other malignant neuroendocrine tumors: Secondary | ICD-10-CM | POA: Diagnosis not present

## 2021-11-09 DIAGNOSIS — M545 Low back pain, unspecified: Secondary | ICD-10-CM | POA: Diagnosis not present

## 2021-11-09 DIAGNOSIS — M459 Ankylosing spondylitis of unspecified sites in spine: Secondary | ICD-10-CM | POA: Diagnosis not present

## 2021-11-09 HISTORY — DX: Ankylosing spondylitis of unspecified sites in spine: M45.9

## 2021-11-15 DIAGNOSIS — H43393 Other vitreous opacities, bilateral: Secondary | ICD-10-CM | POA: Diagnosis not present

## 2021-11-15 DIAGNOSIS — H353112 Nonexudative age-related macular degeneration, right eye, intermediate dry stage: Secondary | ICD-10-CM | POA: Diagnosis not present

## 2021-11-15 DIAGNOSIS — H43813 Vitreous degeneration, bilateral: Secondary | ICD-10-CM | POA: Diagnosis not present

## 2021-11-15 DIAGNOSIS — H353221 Exudative age-related macular degeneration, left eye, with active choroidal neovascularization: Secondary | ICD-10-CM | POA: Diagnosis not present

## 2021-11-26 DIAGNOSIS — N390 Urinary tract infection, site not specified: Secondary | ICD-10-CM | POA: Diagnosis not present

## 2021-11-26 DIAGNOSIS — R109 Unspecified abdominal pain: Secondary | ICD-10-CM | POA: Diagnosis not present

## 2021-11-27 ENCOUNTER — Telehealth: Payer: Self-pay | Admitting: Nurse Practitioner

## 2021-11-27 NOTE — Telephone Encounter (Signed)
Copied from [place] [id number]. Topic: Appointment Scheduling - Scheduling Inquiry for Clinic >> Nov 27, 2021  8:14 AM Leilani Able wrote: Reason for CRM:  Pt was in acute care over weekend , Acute belly pain, cancer pt, They could not determine, given med a little better but wanted pt to fu today asap with PCP gave appt for 3:00 tomorrow but need sooner, office stated to send CRM [phone]

## 2021-11-27 NOTE — Telephone Encounter (Signed)
Patient scheduled with Dr. Neomia Dear for Wednesday June 21st at 9:20 AM.

## 2021-11-28 ENCOUNTER — Ambulatory Visit: Payer: Medicare Other | Admitting: Internal Medicine

## 2021-11-28 ENCOUNTER — Encounter: Payer: Self-pay | Admitting: Emergency Medicine

## 2021-11-28 ENCOUNTER — Ambulatory Visit
Admission: RE | Admit: 2021-11-28 | Discharge: 2021-11-28 | Disposition: A | Discharge status: Home / Self Care | Payer: Medicare Other | Source: Ambulatory Visit | Attending: Gastroenterology | Admitting: Gastroenterology

## 2021-11-28 ENCOUNTER — Other Ambulatory Visit: Payer: Self-pay | Admitting: Gastroenterology

## 2021-11-28 ENCOUNTER — Inpatient Hospital Stay
Admission: EM | Admit: 2021-11-28 | Discharge: 2021-12-06 | DRG: 336 | Disposition: A | Discharge status: Home / Self Care | Payer: Medicare Other | Attending: Internal Medicine | Admitting: Internal Medicine

## 2021-11-28 DIAGNOSIS — E1169 Type 2 diabetes mellitus with other specified complication: Secondary | ICD-10-CM | POA: Diagnosis not present

## 2021-11-28 DIAGNOSIS — K219 Gastro-esophageal reflux disease without esophagitis: Secondary | ICD-10-CM | POA: Diagnosis not present

## 2021-11-28 DIAGNOSIS — R066 Hiccough: Secondary | ICD-10-CM | POA: Diagnosis present

## 2021-11-28 DIAGNOSIS — H3530 Unspecified macular degeneration: Secondary | ICD-10-CM | POA: Diagnosis present

## 2021-11-28 DIAGNOSIS — Z79899 Other long term (current) drug therapy: Secondary | ICD-10-CM | POA: Diagnosis not present

## 2021-11-28 DIAGNOSIS — R1031 Right lower quadrant pain: Secondary | ICD-10-CM | POA: Insufficient documentation

## 2021-11-28 DIAGNOSIS — I700 Atherosclerosis of aorta: Secondary | ICD-10-CM | POA: Diagnosis not present

## 2021-11-28 DIAGNOSIS — K5652 Intestinal adhesions [bands] with complete obstruction: Principal | ICD-10-CM | POA: Diagnosis present

## 2021-11-28 DIAGNOSIS — E871 Hypo-osmolality and hyponatremia: Secondary | ICD-10-CM | POA: Diagnosis not present

## 2021-11-28 DIAGNOSIS — D696 Thrombocytopenia, unspecified: Secondary | ICD-10-CM | POA: Diagnosis present

## 2021-11-28 DIAGNOSIS — Z8249 Family history of ischemic heart disease and other diseases of the circulatory system: Secondary | ICD-10-CM | POA: Diagnosis not present

## 2021-11-28 DIAGNOSIS — D649 Anemia, unspecified: Secondary | ICD-10-CM | POA: Diagnosis not present

## 2021-11-28 DIAGNOSIS — Z7984 Long term (current) use of oral hypoglycemic drugs: Secondary | ICD-10-CM

## 2021-11-28 DIAGNOSIS — E785 Hyperlipidemia, unspecified: Secondary | ICD-10-CM | POA: Diagnosis not present

## 2021-11-28 DIAGNOSIS — R112 Nausea with vomiting, unspecified: Secondary | ICD-10-CM | POA: Diagnosis not present

## 2021-11-28 DIAGNOSIS — I152 Hypertension secondary to endocrine disorders: Secondary | ICD-10-CM | POA: Diagnosis not present

## 2021-11-28 DIAGNOSIS — R1110 Vomiting, unspecified: Secondary | ICD-10-CM | POA: Diagnosis not present

## 2021-11-28 DIAGNOSIS — K5730 Diverticulosis of large intestine without perforation or abscess without bleeding: Secondary | ICD-10-CM | POA: Insufficient documentation

## 2021-11-28 DIAGNOSIS — Z85068 Personal history of other malignant neoplasm of small intestine: Secondary | ICD-10-CM | POA: Diagnosis not present

## 2021-11-28 DIAGNOSIS — Z8505 Personal history of malignant neoplasm of liver: Secondary | ICD-10-CM

## 2021-11-28 DIAGNOSIS — I10 Essential (primary) hypertension: Secondary | ICD-10-CM | POA: Diagnosis present

## 2021-11-28 DIAGNOSIS — K56609 Unspecified intestinal obstruction, unspecified as to partial versus complete obstruction: Secondary | ICD-10-CM | POA: Diagnosis not present

## 2021-11-28 DIAGNOSIS — N400 Enlarged prostate without lower urinary tract symptoms: Secondary | ICD-10-CM | POA: Diagnosis present

## 2021-11-28 DIAGNOSIS — R1032 Left lower quadrant pain: Secondary | ICD-10-CM | POA: Insufficient documentation

## 2021-11-28 DIAGNOSIS — K567 Ileus, unspecified: Secondary | ICD-10-CM | POA: Diagnosis not present

## 2021-11-28 DIAGNOSIS — E119 Type 2 diabetes mellitus without complications: Secondary | ICD-10-CM

## 2021-11-28 DIAGNOSIS — K5792 Diverticulitis of intestine, part unspecified, without perforation or abscess without bleeding: Secondary | ICD-10-CM | POA: Diagnosis not present

## 2021-11-28 DIAGNOSIS — Z9049 Acquired absence of other specified parts of digestive tract: Secondary | ICD-10-CM

## 2021-11-28 DIAGNOSIS — Z833 Family history of diabetes mellitus: Secondary | ICD-10-CM | POA: Diagnosis not present

## 2021-11-28 DIAGNOSIS — E1159 Type 2 diabetes mellitus with other circulatory complications: Secondary | ICD-10-CM | POA: Diagnosis present

## 2021-11-28 DIAGNOSIS — H409 Unspecified glaucoma: Secondary | ICD-10-CM | POA: Diagnosis not present

## 2021-11-28 DIAGNOSIS — K439 Ventral hernia without obstruction or gangrene: Secondary | ICD-10-CM | POA: Diagnosis present

## 2021-11-28 DIAGNOSIS — I4892 Unspecified atrial flutter: Secondary | ICD-10-CM

## 2021-11-28 DIAGNOSIS — Z86711 Personal history of pulmonary embolism: Secondary | ICD-10-CM

## 2021-11-28 DIAGNOSIS — R109 Unspecified abdominal pain: Secondary | ICD-10-CM | POA: Diagnosis not present

## 2021-11-28 HISTORY — DX: Unspecified intestinal obstruction, unspecified as to partial versus complete obstruction: K56.609

## 2021-11-28 LAB — CBC
HCT: 47 % (ref 39.0–52.0)
Hemoglobin: 15.8 g/dL (ref 13.0–17.0)
MCH: 31 pg (ref 26.0–34.0)
MCHC: 33.6 g/dL (ref 30.0–36.0)
MCV: 92.2 fL (ref 80.0–100.0)
Platelets: 235 10*3/uL (ref 150–400)
RBC: 5.1 MIL/uL (ref 4.22–5.81)
RDW: 13 % (ref 11.5–15.5)
WBC: 9.3 10*3/uL (ref 4.0–10.5)
nRBC: 0 % (ref 0.0–0.2)

## 2021-11-28 LAB — BASIC METABOLIC PANEL
Anion gap: 11 (ref 5–15)
BUN: 22 mg/dL (ref 8–23)
CO2: 27 mmol/L (ref 22–32)
Calcium: 9.4 mg/dL (ref 8.9–10.3)
Chloride: 93 mmol/L — ABNORMAL LOW (ref 98–111)
Creatinine, Ser: 0.87 mg/dL (ref 0.61–1.24)
GFR, Estimated: >60 mL/min (ref >60)
Glucose, Bld: 129 mg/dL — ABNORMAL HIGH (ref 70–99)
Potassium: 4 mmol/L (ref 3.5–5.1)
Sodium: 131 mmol/L — ABNORMAL LOW (ref 135–145)

## 2021-11-28 LAB — GLUCOSE, CAPILLARY: Glucose-Capillary: 109 mg/dL — ABNORMAL HIGH (ref 70–99)

## 2021-11-28 MED ORDER — LACTATED RINGERS IV SOLN: INTRAVENOUS | Status: DC

## 2021-11-28 MED ORDER — IOHEXOL 300 MG/ML  SOLN
100.0000 mL | Freq: Once | INTRAMUSCULAR | Status: AC | PRN
  Administered 2021-11-28: 100 mL via INTRAVENOUS

## 2021-11-28 MED ORDER — ONDANSETRON HCL 4 MG PO TABS: 4.0000 mg | ORAL_TABLET | Freq: Four times a day (QID) | ORAL | Status: DC | PRN

## 2021-11-28 MED ORDER — HYDROMORPHONE HCL 1 MG/ML IJ SOLN
0.5000 mg | INTRAMUSCULAR | Status: AC | PRN
  Administered 2021-11-30 – 2021-12-04 (×4): 0.5 mg via INTRAVENOUS
  Filled 2021-11-28 (×4): qty 1

## 2021-11-28 MED ORDER — ONDANSETRON HCL 4 MG/2ML IJ SOLN: 4.0000 mg | Freq: Four times a day (QID) | INTRAMUSCULAR | Status: DC | PRN

## 2021-11-28 MED ORDER — ALBUMIN HUMAN 25 % IV SOLN
25.0000 g | Freq: Once | INTRAVENOUS | Status: AC
  Administered 2021-11-28: 25 g via INTRAVENOUS
  Filled 2021-11-28: qty 100

## 2021-11-28 MED ORDER — SODIUM CHLORIDE 0.9 % IV BOLUS
1000.0000 mL | Freq: Once | INTRAVENOUS | Status: AC
Start: 2021-11-28 — End: 2021-11-28
  Administered 2021-11-28: 1000 mL via INTRAVENOUS

## 2021-11-28 MED ORDER — INSULIN ASPART 100 UNIT/ML IJ SOLN: 0.0000 [IU] | Freq: Every day | INTRAMUSCULAR | Status: DC

## 2021-11-28 MED ORDER — LACTATED RINGERS IV SOLN
INTRAVENOUS | Status: AC
Start: 2021-11-28 — End: 2021-11-29

## 2021-11-28 MED ORDER — ACETAMINOPHEN 325 MG PO TABS
650.0000 mg | ORAL_TABLET | Freq: Four times a day (QID) | ORAL | Status: DC | PRN
  Administered 2021-11-28: 650 mg via ORAL
  Filled 2021-11-28: qty 2

## 2021-11-28 MED ORDER — MORPHINE SULFATE (PF) 2 MG/ML IV SOLN
1.0000 mg | INTRAVENOUS | Status: AC | PRN
  Administered 2021-11-29 – 2021-11-30 (×5): 1 mg via INTRAVENOUS
  Filled 2021-11-28 (×5): qty 1

## 2021-11-28 MED ORDER — INSULIN ASPART 100 UNIT/ML IJ SOLN
0.0000 [IU] | Freq: Three times a day (TID) | INTRAMUSCULAR | Status: DC
  Administered 2021-12-01: 3 [IU] via SUBCUTANEOUS
  Administered 2021-12-01: 2 [IU] via SUBCUTANEOUS
  Administered 2021-12-01: 3 [IU] via SUBCUTANEOUS
  Administered 2021-12-02: 2 [IU] via SUBCUTANEOUS
  Filled 2021-11-28 (×4): qty 1

## 2021-11-28 MED ORDER — ACETAMINOPHEN 650 MG RE SUPP: 650.0000 mg | Freq: Four times a day (QID) | RECTAL | Status: DC | PRN

## 2021-11-28 MED ORDER — HYDRALAZINE HCL 20 MG/ML IJ SOLN: 5.0000 mg | Freq: Four times a day (QID) | INTRAMUSCULAR | Status: DC | PRN

## 2021-11-28 NOTE — Hospital Course (Addendum)
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  Initial vitals in the emergency department showed temperature 98.1, respiration rate of 17, heart rate of 107, blood pressure 139/78, SPO2 of 95% on room air.  Patient had outpatient labs done which showed serum sodium 132, potassium 4.3, chloride 96, bicarb 27, BUN of 20, serum creatinine 0.8, nonfasting blood glucose 134, WBC 8.0, hemoglobin 15.6, platelets of 212.  Alk phos was mildly elevated at 105.  ED treatment: Sodium chloride 1 L bolus and LR 150 mL/h infusion

## 2021-11-28 NOTE — ED Notes (Signed)
First Nurse Note: Pt via POV from home. Pt is sent by his GI doctor has been having 3 days ago also having NVD. Pt had an outpatient CT done today and it showed a SBO, GI doctor has already talked to Dr. Dahlia Byes, MD who is on-call. Recommends NG tube at this time. States they also did blood work that showed hyponatremia at Na 132. No WBC or anemia. Pt is A&Ox4 and NAD

## 2021-11-28 NOTE — H&P (Signed)
History and Physical   Randy Harper QMV:[id number] DOB: [birth year] DOA: 11/28/2021  PCP: Jon Billings, NP  Outpatient Specialists: Dr. Alice Reichert, gastroenterology Patient coming from: Home  I have personally briefly reviewed patient's [place] medical records in [place].  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  Initial vitals in the emergency department showed temperature 98.1, respiration rate of 17, heart rate of 107, blood pressure 139/78, SPO2 of 95% on room air.  Patient had outpatient labs done which showed serum sodium 132, potassium 4.3, chloride 96, bicarb 27, BUN of 20, serum creatinine 0.8, nonfasting blood glucose 134, WBC 8.0, hemoglobin 15.6, platelets of 212.  Alk phos was mildly elevated at 105.  ED treatment: Sodium chloride 1 L bolus and LR 150 mL/h infusion  At bedside patient is able to tell me his name, his age, he is able to identify his spouse Arbie Cookey at bedside.  He reports that he started having abdominal pain since 11/25/2021.  He denies trauma to his person.  Of note he was seen by outpatient care on 11/26/2021 and was diagnosed with acute cystitis.  He reports that he has not had a bowel movement in 1 week and he has not passed flatulence as well.  He denies nausea, vomiting, chest pain, shortness of breath, dysuria, diarrhea, syncope, loss of consciousness.  He reports this is never happened before.  He has history of neuroendocrine tumor status post resection of bowel and the right lobe of his liver.  This happened approximately 5 years ago.   Social history: He lives at  home with his spouse Arbie Cookey.  He denies history of tobacco, EtOH, recreational drug use.  He is retired and formerly worked as a Theme park manager.  ROS: Constitutional: no weight change, no fever ENT/Mouth: no sore throat, no rhinorrhea Eyes: no eye pain, no vision changes Cardiovascular: no chest pain, no dyspnea,  no edema, no palpitations Respiratory: no cough, no sputum, no wheezing Gastrointestinal: no nausea, no vomiting, no diarrhea, + constipation Genitourinary: no urinary incontinence, no dysuria, no hematuria Musculoskeletal: no arthralgias, no myalgias Skin: no skin lesions, no pruritus, Neuro: + weakness, no loss of consciousness, no syncope Psych: no anxiety, no depression, + decrease appetite Heme/Lymph: no bruising, no bleeding  ED Course: Discussed with emergency medicine provider, patient requiring hospitalization for chief concerns of small bowel obstruction.  Assessment/Plan  Principal Problem:   Small bowel obstruction (HCC) Active Problems:   Hypertension   Hyperlipidemia   BPH (benign prostatic hyperplasia)   Hypertension associated with diabetes ([place])   Macular degeneration disease   Diabetes mellitus type 2, noninsulin dependent (HCC)   Assessment and Plan:  * Small bowel obstruction ([place]) - General surgery Dr. Perrin Maltese is aware of patient - We will keep patient n.p.o. at this time - EDP ordered sodium chloride 1 L bolus, we will continue with LR 150 mL/h continuously for 1 day - Attempt conservative management at this time - If patient develops nausea and vomiting, NG tube will be considered - Admit to telemetry medical, observation  Diabetes mellitus type 2, noninsulin dependent (HCC) - Metformin 500 mg p.o. daily has not been resumed as patient is n.p.o. at this time -  Insulin SSI with HS coverage ordered - Goal inpatient blood glucose levels 140-180  Hyperlipidemia - Atorvastatin 80 mg nightly has not been resumed  Hypertension - Diltiazem to 40 mg p.o.  daily has not been resumed - Hydralazine 5 mg IV every 6 hours as needed for SBP greater than 175 ordered  Chart reviewed.   DVT prophylaxis: ted hose Code Status: full code Diet: N.p.o. Family Communication: Updated spouse at bedside with patient's permission Disposition Plan: Pending clinical course Consults called: General surgery Admission status: Telemetry medical, observation  Past Medical History:  Diagnosis Date   Blood transfusion without reported diagnosis 01/2017   Cancer ([place]) 01/2017   metastic neuroendocrine - liver   Diabetes mellitus without complication ([place])    Dysrhythmia    supraventricular Tach   Glaucoma 2015   Hyperlipidemia    Hypertension    Muscle strain of chest wall    PE (pulmonary thromboembolism) ([place])    Supraventricular tachycardia ([place])    Past Surgical History:  Procedure Laterality Date   CHOLECYSTECTOMY  01/2017   related to liver cancer   COLONOSCOPY     COLONOSCOPY WITH PROPOFOL N/A 03/17/2019   Procedure: COLONOSCOPY WITH PROPOFOL;  Surgeon: Lollie Sails, MD;  Location[facility] ENDOSCOPY;  Service: Endoscopy;  Laterality: N/A;   ESOPHAGOGASTRODUODENOSCOPY N/A 06/06/2021   Procedure: ESOPHAGOGASTRODUODENOSCOPY (EGD);  Surgeon: Virgel Manifold, MD;  Location[facility] ENDOSCOPY;  Service: Endoscopy;  Laterality: N/A;   ESOPHAGOGASTRODUODENOSCOPY (EGD) WITH PROPOFOL N/A 08/22/2021   Procedure: ESOPHAGOGASTRODUODENOSCOPY (EGD) WITH PROPOFOL;  Surgeon: Lesly Rubenstein, MD;  Location: [facility] ENDOSCOPY;  Service: Endoscopy;  Laterality: N/A;  DM   LAPAROSCOPY ABDOMEN DIAGNOSTIC     resection liver total right lobe  12/2016   SMALL INTESTINE SURGERY  01/2017   cancer surgery   TENDON GRAFT Right    right thumb tendon graft at age 32    Social History:  reports that he has never smoked. He has never used smokeless tobacco. He reports that he does not drink alcohol and does not use drugs.  No Known Allergies Family History  Problem  Relation Age of Onset   Heart disease Father    Diabetes Maternal Uncle    Diabetes Maternal Uncle    Diabetes Maternal Uncle    Diabetes Maternal Uncle    Diabetes Maternal Grandmother    Family history: Family history reviewed and not pertinent.  Prior to Admission medications   Medication Sig Start Date End Date Taking? Authorizing Provider  acetaminophen (TYLENOL) 500 MG tablet Take 500 mg by mouth every 6 (six) hours as needed for mild pain, moderate pain or headache.    [provider]  Aflibercept 2 MG/0.05ML SOLN Apply to eye. Every 6 weeks injection    [provider]  atorvastatin (LIPITOR) 80 MG tablet Take 1 tablet (80 mg total) by mouth daily. 08/09/21   Jon Billings, NP  brimonidine (ALPHAGAN) 0.2 % ophthalmic solution Place 1 drop into the left eye daily.  09/12/15   [provider]  diltiazem (CARDIZEM CD) 240 MG 24 hr capsule Take 1 capsule (240 mg total) by mouth daily. 08/09/21   Jon Billings, NP  metFORMIN (GLUCOPHAGE) 500 MG tablet Take 1 tablet (500 mg total) by mouth daily with breakfast. 08/09/21   Jon Billings, NP  Multiple Vitamin (MULTIVITAMIN) tablet Take 1 tablet by mouth daily.    [provider]  pantoprazole (PROTONIX) 40 MG tablet Take 1 tablet (40 mg total) by mouth daily for 28  days. 06/06/21 07/04/21  Virgel Manifold, MD   Physical Exam: Vitals:   11/28/21 1821 11/28/21 1822  BP:  139/78  Pulse:  (!) 107  Resp:  17  Temp:  98.1 F (36.7 C)  TempSrc:  Oral  SpO2:  95%  Weight: 97.1 kg   Height: 5' 8"  (1.727 m)    Constitutional: appears age-appropriate, NAD, calm, comfortable Eyes: PERRL, lids and conjunctivae normal ENMT: Mucous membranes are moist. Posterior pharynx clear of any exudate or lesions. Age-appropriate dentition. Hearing appropriate Neck: normal, supple, no masses, no thyromegaly Respiratory: clear to auscultation bilaterally, no wheezing, no crackles. Normal respiratory effort. No  accessory muscle use.  Cardiovascular: Regular rate and rhythm, no murmurs / rubs / gallops. No extremity edema. 2+ pedal pulses. No carotid bruits.  Abdomen: Distended abdomen, mild to moderate diffuse tenderness, no masses palpated, no hepatosplenomegaly.  No bowel sounds on auscultation. Musculoskeletal: no clubbing / cyanosis. No joint deformity upper and lower extremities. Good ROM, no contractures, no atrophy. Normal muscle tone.  Skin: no rashes, lesions, ulcers. No induration Neurologic: Sensation intact. Strength 5/5 in all 4.  Psychiatric: Normal judgment and insight. Alert and oriented x 3. Normal mood.   EKG: Not indicated Chest x-ray on Admission: Not indicated  CT ABDOMEN PELVIS W CONTRAST  Result Date: 11/28/2021 CLINICAL DATA:  Bilateral lower quadrant pain with nausea and vomiting. EXAM: CT ABDOMEN AND PELVIS WITH CONTRAST TECHNIQUE: Multidetector CT imaging of the abdomen and pelvis was performed using the standard protocol following bolus administration of intravenous contrast. RADIATION DOSE REDUCTION: This exam was performed according to the departmental dose-optimization program which includes automated exposure control, adjustment of the mA and/or kV according to patient size and/or use of iterative reconstruction technique. CONTRAST:  170m OMNIPAQUE IOHEXOL 300 MG/ML  SOLN COMPARISON:  June 06, 2021 FINDINGS: Lower chest: Mild linear scarring and/or atelectasis is seen within the posterior aspect of the right lung base. Hepatobiliary: Postoperative changes are seen consistent with prior right hepatectomy. No focal liver lesions are identified. Status post cholecystectomy. No biliary dilatation. Pancreas: Unremarkable. No pancreatic ductal dilatation or surrounding inflammatory changes. Spleen: Normal in size without focal abnormality. Adrenals/Urinary Tract: A stable 1.2 cm diameter low-attenuation left adrenal mass is seen (approximately 18.07 Hounsfield units). The right  adrenal gland is unremarkable. Kidneys are normal in size, without renal calculi or hydronephrosis. A 6 mm focus of parenchymal low attenuation is seen within the posterior aspect of the mid right kidney. This is too small to characterize by CT examination. Mild, predominantly anterior urinary bladder wall thickening is seen. Stomach/Bowel: Stomach is within normal limits. Appendix appears normal. Numerous dilated small bowel loops are seen throughout the abdomen and pelvis (maximum small bowel diameter of approximately 5.0 cm). An abrupt transition zone is seen within the mid right abdomen (axial CT images 42 through 59, CT series 2). Surgically anastomosed bowel and mild mesenteric twisting are also seen within this region. Noninflamed diverticula are seen throughout the large bowel. Vascular/Lymphatic: Aortic atherosclerosis. No enlarged abdominal or pelvic lymph nodes. Reproductive: Prostate is unremarkable. Other: A 2.4 cm x 2.8 cm fat containing right-sided para umbilical hernia is noted. A trace amount of posterior pelvic free fluid is seen. No intra-abdominal free air is identified. Musculoskeletal: Multilevel degenerative changes are seen throughout the lumbar spine. IMPRESSION: 1. High-grade small bowel obstruction with a transition zone seen within the mid right abdomen, at the level of the distal jejunum, along an area of mesenteric twisting and surgically anastomosed bowel.  2. Colonic diverticulosis. 3. Stable low-attenuation left adrenal adenoma. 4. Fat-containing right-sided para umbilical hernia. 5. Aortic atherosclerosis. Aortic Atherosclerosis (ICD10-I70.0). Electronically Signed   By: Virgina Norfolk M.D.   On: 11/28/2021 17:20    Labs on Admission: I have personally reviewed following outpatient labs  Serum sodium 132, potassium 4.3, chloride 96, bicarb 27, BUN of 20, serum creatinine of 0.8, nonfasting blood glucose 134, alk phos was mildly elevated at 105.  WBC 8.0, hemoglobin 15.6,  hematocrit 46, platelets of 212.  Neutrophil percentage is mildly elevated at 71.2.  Urine analysis:    Component Value Date/Time   APPEARANCEUR Clear 08/09/2021 0820   GLUCOSEU Negative 08/09/2021 0820   BILIRUBINUR Negative 08/09/2021 0820   PROTEINUR Negative 08/09/2021 0820   NITRITE Negative 08/09/2021 0820   LEUKOCYTESUR Negative 08/09/2021 0820   Dr. Tobie Poet [facility]  If 7PM-7AM, please contact overnight-coverage provider If 7AM-7PM, please contact day coverage provider [email or web address]  11/28/2021, 7:59 PM

## 2021-11-28 NOTE — Assessment & Plan Note (Addendum)
-   Atorvastatin 80 mg nightly has not been resumed

## 2021-11-28 NOTE — ED Provider Notes (Signed)
[facility] Provider Note    None    (approximate)   History   Abdominal Pain   HPI  [facility] is a 71 y.o. male who presents to the ED for evaluation of Abdominal Pain   I reviewed GI clinic visit from earlier today where patient was evaluated for abdominal pain, had an outpatient CT with evidence of a high-grade small bowel obstruction. I reviewed the urgent care visit from 2 days ago where patient was diagnosed with acute cystitis due to dipstick urine with trace leukocytes and discharged with Augmentin. I interpret CBC and CMP from this afternoon with no leukocytosis or anemia, metabolic panel similarly reassuring.  Patient presents to the ED for evaluation of increased abdominal pain, distention, nausea and emesis the past 4 days. Reports improved nausea and no more emesis after starting the Zofran that he got with his Augmentin when he was diagnosed with a UTI.  Denies any urinary symptoms.  Physical Exam   Triage Vital Signs: ED Triage Vitals  Enc Vitals Group     BP 11/28/21 1822 139/78     Pulse Rate 11/28/21 1822 (!) 107     Resp 11/28/21 1822 17     Temp 11/28/21 1822 98.1 F (36.7 C)     Temp Source 11/28/21 1822 Oral     SpO2 11/28/21 1822 95 %     Weight 11/28/21 1821 214 lb (97.1 kg)     Height 11/28/21 1821 '5\' 8"'$  (1.727 m)     Head Circumference --      Peak Flow --      Pain Score 11/28/21 1821 3     Pain Loc --      Pain Edu? --      Excl. in [place]? --     Most recent vital signs: Vitals:   11/28/21 1822  BP: 139/78  Pulse: (!) 107  Resp: 17  Temp: 98.1 F (36.7 C)  SpO2: 95%    General: Awake, no distress.  Pleasant and conversational.  Sitting up in bed and looks well.  No emesis. CV:  Good peripheral perfusion.  Resp:  Normal effort.  Abd:  Moderate distention without being tense.  Has a large semilunar incision over his right upper quadrant from previous surgical intervention.  Tender throughout the right  side.  No peritoneal features. MSK:  No deformity noted.  Neuro:  No focal deficits appreciated. Other:     ED Results / Procedures / Treatments   Labs (all labs ordered are listed, but only abnormal results are displayed) Labs Reviewed  BASIC METABOLIC PANEL  CBC    EKG   RADIOLOGY CT abdomen/pelvis interpreted by me with evidence of SBO  Official radiology report(s): CT ABDOMEN PELVIS W CONTRAST  Result Date: 11/28/2021 CLINICAL DATA:  Bilateral lower quadrant pain with nausea and vomiting. EXAM: CT ABDOMEN AND PELVIS WITH CONTRAST TECHNIQUE: Multidetector CT imaging of the abdomen and pelvis was performed using the standard protocol following bolus administration of intravenous contrast. RADIATION DOSE REDUCTION: This exam was performed according to the departmental dose-optimization program which includes automated exposure control, adjustment of the mA and/or kV according to patient size and/or use of iterative reconstruction technique. CONTRAST:  124m OMNIPAQUE IOHEXOL 300 MG/ML  SOLN COMPARISON:  June 06, 2021 FINDINGS: Lower chest: Mild linear scarring and/or atelectasis is seen within the posterior aspect of the right lung base. Hepatobiliary: Postoperative changes are seen consistent with prior right hepatectomy. No focal liver lesions  are identified. Status post cholecystectomy. No biliary dilatation. Pancreas: Unremarkable. No pancreatic ductal dilatation or surrounding inflammatory changes. Spleen: Normal in size without focal abnormality. Adrenals/Urinary Tract: A stable 1.2 cm diameter low-attenuation left adrenal mass is seen (approximately 18.07 Hounsfield units). The right adrenal gland is unremarkable. Kidneys are normal in size, without renal calculi or hydronephrosis. A 6 mm focus of parenchymal low attenuation is seen within the posterior aspect of the mid right kidney. This is too small to characterize by CT examination. Mild, predominantly anterior urinary bladder  wall thickening is seen. Stomach/Bowel: Stomach is within normal limits. Appendix appears normal. Numerous dilated small bowel loops are seen throughout the abdomen and pelvis (maximum small bowel diameter of approximately 5.0 cm). An abrupt transition zone is seen within the mid right abdomen (axial CT images 42 through 59, CT series 2). Surgically anastomosed bowel and mild mesenteric twisting are also seen within this region. Noninflamed diverticula are seen throughout the large bowel. Vascular/Lymphatic: Aortic atherosclerosis. No enlarged abdominal or pelvic lymph nodes. Reproductive: Prostate is unremarkable. Other: A 2.4 cm x 2.8 cm fat containing right-sided para umbilical hernia is noted. A trace amount of posterior pelvic free fluid is seen. No intra-abdominal free air is identified. Musculoskeletal: Multilevel degenerative changes are seen throughout the lumbar spine. IMPRESSION: 1. High-grade small bowel obstruction with a transition zone seen within the mid right abdomen, at the level of the distal jejunum, along an area of mesenteric twisting and surgically anastomosed bowel. 2. Colonic diverticulosis. 3. Stable low-attenuation left adrenal adenoma. 4. Fat-containing right-sided para umbilical hernia. 5. Aortic atherosclerosis. Aortic Atherosclerosis (ICD10-I70.0). Electronically Signed   By: Virgina Norfolk M.D.   On: 11/28/2021 17:20    PROCEDURES and INTERVENTIONS:  Procedures  Medications  acetaminophen (TYLENOL) tablet 650 mg (has no administration in time range)    Or  acetaminophen (TYLENOL) suppository 650 mg (has no administration in time range)  ondansetron (ZOFRAN) tablet 4 mg (has no administration in time range)    Or  ondansetron (ZOFRAN) injection 4 mg (has no administration in time range)  hydrALAZINE (APRESOLINE) injection 5 mg (has no administration in time range)  insulin aspart (novoLOG) injection 0-15 Units (has no administration in time range)  insulin aspart  (novoLOG) injection 0-5 Units (has no administration in time range)  morphine (PF) 2 MG/ML injection 1 mg (has no administration in time range)  HYDROmorphone (DILAUDID) injection 0.5 mg (has no administration in time range)  lactated ringers infusion (has no administration in time range)  sodium chloride 0.9 % bolus 1,000 mL (has no administration in time range)     IMPRESSION / MDM / ASSESSMENT AND PLAN / ED COURSE  I reviewed the triage vital signs and the nursing notes.  Differential diagnosis includes, but is not limited to, SBO, SBP, diverticulitis, appendicitis, ascites  {Patient presents with symptoms of an acute illness or injury that is potentially life-threatening.  41 year [place] male presents with abdominal pain and distention with evidence of SBO requiring medical admission and surgical evaluation.  He look systemically well.  Has some mild right-sided tenderness without general peritoneal symptoms.  I reviewed outpatient blood work and CT, as above.  Consulted with surgery and medicine.  Clinical Course as of 11/28/21 2013  Tue Nov 28, 2021  1918 I consult surgery who will evaluate the patient [DS]    Clinical Course User Index [DS] Vladimir Crofts, MD     FINAL CLINICAL IMPRESSION(S) / ED DIAGNOSES   Final diagnoses:  Small bowel obstruction (  [place])     Rx / DC Orders   ED Discharge Orders     None        Note:  This document was prepared using Dragon voice recognition software and may include unintentional dictation errors.   Vladimir Crofts, MD 11/28/21 2015

## 2021-11-28 NOTE — Assessment & Plan Note (Signed)
-   Diltiazem to 40 mg p.o. daily has not been resumed - Hydralazine 5 mg IV every 6 hours as needed for SBP greater than 175 ordered

## 2021-11-28 NOTE — Assessment & Plan Note (Signed)
-   General surgery Dr. Perrin Maltese is aware of patient - We will keep patient n.p.o. at this time - EDP ordered sodium chloride 1 L bolus, we will continue with LR 150 mL/h continuously for 1 day - Attempt conservative management at this time - If patient develops nausea and vomiting, NG tube will be considered - Admit to telemetry medical, observation

## 2021-11-28 NOTE — ED Triage Notes (Signed)
Pt endorses abd pain since Saturday. Pt had CT done today that showed SBO. Pt has been taking nausea meds with relief.

## 2021-11-28 NOTE — Consult Note (Signed)
Patient ID: [facility], male   DOB: 1950-06-12, 71 y.o.   MRN: [id number]  HPI [facility] is a 71 y.o. male seen in consultation at the request of Dr. Tamala Julian.  I have also discussed with  [facility] who actually saw him in his office and sent a CT scan.  He reports intermittent crampy abdominal pain for the last 3 days.  Pain is mild to moderate intensity.  He did have some nausea and some vomiting.  He also endorses hiccups.  Of note he did have a history of partial hepatectomy, cholecystectomy and small bowel resection at [facility] for neuroendocrine tumor.  He did have a CT scan that I personally reviewed showing evidence of transition zone on the right side with small bowel obstruction.  There is no evidence of pneumatosis there is no evidence of bowel ischemia there is no evidence of perforation.  There is evidence of a mesenteric swirl.  He also has ventral hernia The patient endorses passing flatus and actually feeling better here in the emergency room.  His white count and CMP were completely normal. He is able to perform more than 4 METS of activity without any shortness of breath or chest pain.  HPI  Past Medical History:  Diagnosis Date   Blood transfusion without reported diagnosis 01/2017   Cancer [facility]) 01/2017   metastic neuroendocrine - liver   Diabetes mellitus without complication ([facility])    Dysrhythmia    supraventricular Tach   Glaucoma 2015   Hyperlipidemia    Hypertension    Muscle strain of chest wall    PE (pulmonary thromboembolism) ([facility])    Supraventricular tachycardia ([facility])     Past Surgical History:  Procedure Laterality Date   CHOLECYSTECTOMY  01/2017   related to liver cancer   COLONOSCOPY     COLONOSCOPY WITH PROPOFOL N/A 03/17/2019   Procedure: COLONOSCOPY WITH PROPOFOL;  Surgeon: Lollie Sails, MD;  [facility];  Service: Endoscopy;  Laterality: N/A;   ESOPHAGOGASTRODUODENOSCOPY N/A 06/06/2021   Procedure: ESOPHAGOGASTRODUODENOSCOPY  (EGD);  Surgeon: Virgel Manifold, MD;  Location: [facility];  Service: Endoscopy;  Laterality: N/A;   ESOPHAGOGASTRODUODENOSCOPY (EGD) WITH PROPOFOL N/A 08/22/2021   Procedure: ESOPHAGOGASTRODUODENOSCOPY (EGD) WITH PROPOFOL;  Surgeon: Lesly Rubenstein, MD;  Location: [facility];  Service: Endoscopy;  Laterality: N/A;  DM   LAPAROSCOPY ABDOMEN DIAGNOSTIC     resection liver total right lobe  12/2016   SMALL INTESTINE SURGERY  01/2017   cancer surgery   TENDON GRAFT Right    right thumb tendon graft at age 35     Family History  Problem Relation Age of Onset   Heart disease Father    Diabetes Maternal Uncle    Diabetes Maternal Uncle    Diabetes Maternal Uncle    Diabetes Maternal Uncle    Diabetes Maternal Grandmother     Social History Social History   Tobacco Use   Smoking status: Never   Smokeless tobacco: Never  Vaping Use   Vaping Use: Never used  Substance Use Topics   Alcohol use: Never   Drug use: No    No Known Allergies  Current Facility-Administered Medications  Medication Dose Route Frequency Provider Last Rate Last Admin   acetaminophen (TYLENOL) tablet 650 mg  650 mg Oral Q6H PRN Cox, Amy N, DO       Or   acetaminophen (TYLENOL) suppository 650 mg  650 mg Rectal Q6H PRN Cox, Amy N, DO  hydrALAZINE (APRESOLINE) injection 5 mg  5 mg Intravenous Q6H PRN Cox, Amy N, DO       HYDROmorphone (DILAUDID) injection 0.5 mg  0.5 mg Intravenous Q3H PRN Cox, Amy N, DO       [START ON 11/29/2021] insulin aspart (novoLOG) injection 0-15 Units  0-15 Units Subcutaneous TID WC Cox, Amy N, DO       insulin aspart (novoLOG) injection 0-5 Units  0-5 Units Subcutaneous QHS Cox, Amy N, DO       lactated ringers infusion   Intravenous Continuous Cox, Amy N, DO       morphine (PF) 2 MG/ML injection 1 mg  1 mg Intravenous Q4H PRN Cox, Amy N, DO       ondansetron (ZOFRAN) tablet 4 mg  4 mg Oral Q6H PRN Cox, Amy N, DO       Or   ondansetron (ZOFRAN) injection 4 mg   4 mg Intravenous Q6H PRN Cox, Amy N, DO         Review of Systems Full ROS  was asked and was negative except for the information on the HPI  Physical Exam Blood pressure 137/71, pulse 98, temperature 98.4 F (36.9 C), resp. rate 17, height '5\' 8"'$  (1.727 m), weight 97.1 kg, SpO2 93 %. CONSTITUTIONAL: NAD. EYES: Pupils are equal, round,  Sclera are non-icteric. EARS, NOSE, MOUTH AND THROAT:  The oral mucosa is pink and moist. Hearing is intact to voice. LYMPH NODES:  Lymph nodes in the neck are normal. RESPIRATORY:  Lungs are clear. There is normal respiratory effort, with equal breath sounds bilaterally, and without pathologic use of accessory muscles. CARDIOVASCULAR: Heart is regular without murmurs, gallops, or rubs. GI: He has midline laparotomy as well as a transverse right upper quadrant incision consistent with major hepatobiliary exploration, the abdomen is  soft , mildly distended and mildly tender to palpation right lower quadrant without peritonitis or rebound.  There are no palpable masses. There is no hepatosplenomegaly. There are decreased l bowel sounds  GU: Rectal deferred.   MUSCULOSKELETAL: Normal muscle strength and tone. No cyanosis or edema.   SKIN: Turgor is good and there are no pathologic skin lesions or ulcers. NEUROLOGIC: Motor and sensation is grossly normal. Cranial nerves are grossly intact. PSYCH:  Oriented to person, place and time. Affect is normal.  Data Reviewed  I have personally reviewed the patient's imaging, laboratory findings and medical records.    Assessment/Plan 64 year [place] male presented with abdominal pain nausea and vomiting consistent with partial small bowel obstruction.  There is definite transition zone with some mesenteric swirling sign.  Clinically the patient looks better than the CT scan and his abdomen is benign without peritonitis.  I had an extensive discussion with the patient regarding his disease process.  I do think that at this  time his clinical picture is not clear-cut.  He is transition zone can certainly be related to an adhesion versus neuroendocrine tumor deposit.  I do not think that we have a definitive diagnosis.  Currently he does not have a white count he is not toxic not peritonitic.  I do think that is reasonable to manage him conservatively with an NG tube and serial abdominal exams.  I will order a KUB for tomorrow.  We will start fluid resuscitation's.  I had an extensive discussion with the patient regarding options of exploratory laparotomy versus NG tube placement.  He understands that if he deteriorates we will have to proceed with surgical intervention.  He wishes to stay here at [facility] for now.  Please note I spent greater than 75 minutes's encounter including personally reviewing imaging studies, coordination of his care, placing orders and performing appropriate mentation  Caroleen Hamman, MD FACS General Surgeon 11/28/2021, 10:17 PM

## 2021-11-28 NOTE — Assessment & Plan Note (Signed)
-   Metformin 500 mg p.o. daily has not been resumed as patient is n.p.o. at this time - Insulin SSI with HS coverage ordered - Goal inpatient blood glucose levels 140-180

## 2021-11-29 ENCOUNTER — Other Ambulatory Visit: Payer: Self-pay

## 2021-11-29 ENCOUNTER — Ambulatory Visit: Payer: Medicare Other | Admitting: Internal Medicine

## 2021-11-29 ENCOUNTER — Encounter: Payer: Self-pay | Admitting: Internal Medicine

## 2021-11-29 ENCOUNTER — Observation Stay: Payer: Medicare Other

## 2021-11-29 DIAGNOSIS — K219 Gastro-esophageal reflux disease without esophagitis: Secondary | ICD-10-CM | POA: Diagnosis present

## 2021-11-29 DIAGNOSIS — K56609 Unspecified intestinal obstruction, unspecified as to partial versus complete obstruction: Secondary | ICD-10-CM | POA: Diagnosis not present

## 2021-11-29 DIAGNOSIS — K567 Ileus, unspecified: Secondary | ICD-10-CM | POA: Diagnosis not present

## 2021-11-29 DIAGNOSIS — H409 Unspecified glaucoma: Secondary | ICD-10-CM | POA: Diagnosis present

## 2021-11-29 DIAGNOSIS — D649 Anemia, unspecified: Secondary | ICD-10-CM | POA: Diagnosis present

## 2021-11-29 DIAGNOSIS — Z79899 Other long term (current) drug therapy: Secondary | ICD-10-CM | POA: Diagnosis not present

## 2021-11-29 DIAGNOSIS — E785 Hyperlipidemia, unspecified: Secondary | ICD-10-CM | POA: Diagnosis not present

## 2021-11-29 DIAGNOSIS — E871 Hypo-osmolality and hyponatremia: Secondary | ICD-10-CM | POA: Diagnosis present

## 2021-11-29 DIAGNOSIS — N400 Enlarged prostate without lower urinary tract symptoms: Secondary | ICD-10-CM | POA: Diagnosis present

## 2021-11-29 DIAGNOSIS — K5652 Intestinal adhesions [bands] with complete obstruction: Secondary | ICD-10-CM | POA: Diagnosis present

## 2021-11-29 DIAGNOSIS — Z4659 Encounter for fitting and adjustment of other gastrointestinal appliance and device: Secondary | ICD-10-CM | POA: Diagnosis not present

## 2021-11-29 DIAGNOSIS — E1169 Type 2 diabetes mellitus with other specified complication: Secondary | ICD-10-CM | POA: Diagnosis present

## 2021-11-29 DIAGNOSIS — D696 Thrombocytopenia, unspecified: Secondary | ICD-10-CM | POA: Diagnosis present

## 2021-11-29 DIAGNOSIS — Z8249 Family history of ischemic heart disease and other diseases of the circulatory system: Secondary | ICD-10-CM | POA: Diagnosis not present

## 2021-11-29 DIAGNOSIS — Z4682 Encounter for fitting and adjustment of non-vascular catheter: Secondary | ICD-10-CM | POA: Diagnosis not present

## 2021-11-29 DIAGNOSIS — Z9049 Acquired absence of other specified parts of digestive tract: Secondary | ICD-10-CM | POA: Diagnosis not present

## 2021-11-29 DIAGNOSIS — Z8505 Personal history of malignant neoplasm of liver: Secondary | ICD-10-CM | POA: Diagnosis not present

## 2021-11-29 DIAGNOSIS — K6389 Other specified diseases of intestine: Secondary | ICD-10-CM | POA: Diagnosis not present

## 2021-11-29 DIAGNOSIS — R066 Hiccough: Secondary | ICD-10-CM | POA: Diagnosis present

## 2021-11-29 DIAGNOSIS — Z85068 Personal history of other malignant neoplasm of small intestine: Secondary | ICD-10-CM | POA: Diagnosis not present

## 2021-11-29 DIAGNOSIS — H3530 Unspecified macular degeneration: Secondary | ICD-10-CM | POA: Diagnosis present

## 2021-11-29 DIAGNOSIS — Z86711 Personal history of pulmonary embolism: Secondary | ICD-10-CM | POA: Diagnosis not present

## 2021-11-29 DIAGNOSIS — I152 Hypertension secondary to endocrine disorders: Secondary | ICD-10-CM | POA: Diagnosis present

## 2021-11-29 DIAGNOSIS — Z7984 Long term (current) use of oral hypoglycemic drugs: Secondary | ICD-10-CM | POA: Diagnosis not present

## 2021-11-29 DIAGNOSIS — Z833 Family history of diabetes mellitus: Secondary | ICD-10-CM | POA: Diagnosis not present

## 2021-11-29 DIAGNOSIS — I700 Atherosclerosis of aorta: Secondary | ICD-10-CM | POA: Diagnosis not present

## 2021-11-29 DIAGNOSIS — K439 Ventral hernia without obstruction or gangrene: Secondary | ICD-10-CM | POA: Diagnosis present

## 2021-11-29 HISTORY — DX: Unspecified intestinal obstruction, unspecified as to partial versus complete obstruction: K56.609

## 2021-11-29 LAB — CBC
HCT: 39.5 % (ref 39.0–52.0)
Hemoglobin: 13.5 g/dL (ref 13.0–17.0)
MCH: 31.4 pg (ref 26.0–34.0)
MCHC: 34.2 g/dL (ref 30.0–36.0)
MCV: 91.9 fL (ref 80.0–100.0)
Platelets: 181 10*3/uL (ref 150–400)
RBC: 4.3 MIL/uL (ref 4.22–5.81)
RDW: 13 % (ref 11.5–15.5)
WBC: 8.8 10*3/uL (ref 4.0–10.5)
nRBC: 0 % (ref 0.0–0.2)

## 2021-11-29 LAB — COMPREHENSIVE METABOLIC PANEL
ALT: 17 U/L (ref 0–44)
AST: 18 U/L (ref 15–41)
Albumin: 3.8 g/dL (ref 3.5–5.0)
Alkaline Phosphatase: 74 U/L (ref 38–126)
Anion gap: 7 (ref 5–15)
BUN: 16 mg/dL (ref 8–23)
CO2: 26 mmol/L (ref 22–32)
Calcium: 9 mg/dL (ref 8.9–10.3)
Chloride: 101 mmol/L (ref 98–111)
Creatinine, Ser: 0.72 mg/dL (ref 0.61–1.24)
GFR, Estimated: >60 mL/min (ref >60)
Glucose, Bld: 116 mg/dL — ABNORMAL HIGH (ref 70–99)
Potassium: 4.2 mmol/L (ref 3.5–5.1)
Sodium: 134 mmol/L — ABNORMAL LOW (ref 135–145)
Total Bilirubin: 1 mg/dL (ref 0.3–1.2)
Total Protein: 6.6 g/dL (ref 6.5–8.1)

## 2021-11-29 LAB — GLUCOSE, CAPILLARY
Glucose-Capillary: 102 mg/dL — ABNORMAL HIGH (ref 70–99)
Glucose-Capillary: 113 mg/dL — ABNORMAL HIGH (ref 70–99)
Glucose-Capillary: 99 mg/dL (ref 70–99)
Glucose-Capillary: 90 mg/dL (ref 70–99)
Glucose-Capillary: 84 mg/dL (ref 70–99)

## 2021-11-29 LAB — MAGNESIUM: Magnesium: 1.9 mg/dL (ref 1.7–2.4)

## 2021-11-29 NOTE — Progress Notes (Signed)
[facility]ESS NOTE (cpt [phone])  Hospital Day(s): 0.   Interval History: Patient seen and examined, no acute events or new complaints overnight. Patient reports he is doing about the same. He denied any abdominal pain. He does not feel bloated but his abdomen is grossly distended. He denies fever, chills, nausea. Labs are reassuring this morning; he remains without leukocytosis; renal function normal. NGT output not recorded but has about 300 ccs of bilious output in canister. KUB this morning appears to show persistent SBO. No flatus.   Review of Systems:  Constitutional: denies fever, chills  HEENT: denies cough or congestion  Respiratory: denies any shortness of breath  Cardiovascular: denies chest pain or palpitations  Gastrointestinal: + distension, denies abdominal pain, N/V/D Genitourinary: denies burning with urination or urinary frequency Musculoskeletal: denies pain, decreased motor or sensation  Vital signs in last 24 hours: [min-max] current  Temp:  [98.1 F (36.7 C)-98.5 F (36.9 C)] 98.4 F (36.9 C) (06/21 0752) Pulse Rate:  [98-163] 100 (06/21 0752) Resp:  [17-20] 18 (06/21 0752) BP: (135-146)/(63-78) 146/73 (06/21 0752) SpO2:  [92 %-95 %] 92 % (06/21 0752) Weight:  [97.1 kg] 97.1 kg (06/20 1821)     Height: '5\' 8"'$  (172.7 cm) Weight: 97.1 kg BMI (Calculated): 32.55   Intake/Output last 2 shifts:  06/20 0701 - 06/21 0700 In: 186.7 [I.V.:0.4; IV Piggyback:186.3] Out: -    Physical Exam:  Constitutional: alert, cooperative and no distress  HENT: normocephalic without obvious abnormality; NGT in place; output bilious; flushed without issues Eyes: PERRL, EOM's grossly intact and symmetric  Respiratory: breathing non-labored at rest  Cardiovascular: regular rate and sinus rhythm  Gastrointestinal: Soft, non-tender, he is grossly distended and tympanic, no rebound/guarding. He is without evidence of peritonitis  Musculoskeletal: no edema  or wounds, motor and sensation grossly intact, NT    Labs:     Latest Ref Rng & Units 11/29/2021    3:19 AM 11/28/2021    6:34 PM 08/09/2021    8:22 AM  CBC  WBC 4.0 - 10.5 K/uL 8.8  9.3  4.5   Hemoglobin 13.0 - 17.0 g/dL 13.5  15.8  14.8   Hematocrit 39.0 - 52.0 % 39.5  47.0  44.4   Platelets 150 - 400 K/uL 181  235  175       Latest Ref Rng & Units 11/29/2021    3:19 AM 11/28/2021    6:34 PM 08/09/2021    8:22 AM  CMP  Glucose 70 - 99 mg/dL 116  129  105   BUN 8 - 23 mg/dL '16  22  13   '$ Creatinine 0.61 - 1.24 mg/dL 0.72  0.87  0.81   Sodium 135 - 145 mmol/L 134  131  139   Potassium 3.5 - 5.1 mmol/L 4.2  4.0  4.0   Chloride 98 - 111 mmol/L 101  93  99   CO2 22 - 32 mmol/L '26  27  24   '$ Calcium 8.9 - 10.3 mg/dL 9.0  9.4  9.2   Total Protein 6.5 - 8.1 g/dL 6.6   6.9   Total Bilirubin 0.3 - 1.2 mg/dL 1.0   0.5   Alkaline Phos 38 - 126 U/L 74   110   AST 15 - 41 U/L 18   25   ALT 0 - 44 U/L 17   22      Imaging studies:   KUB (11/29/2021) personally reviewed which continues to show air fluid levels  in the stomach and small bowel, appears to have persistent obstructive process, and radiologist report and addendum reviewed below: IMPRESSION: 1. No acute cardiopulmonary disease. 2. Gastric tube in place tip in the proximal stomach, side-port near the GE junction. This could be advanced slightly for more optimal placement approximately 3-3-1/2 cm. 3. Signs of persistent small bowel dilation, incompletely assessed.  ADDENDUM: There are additional views provided of the abdomen.   Air-fluid levels are noted throughout the abdomen involving small bowel loops. There is no free air beneath either the RIGHT or LEFT hemidiaphragm. EKG leads project over the abdomen. There is some gas in the rectum and a small amount of gas in the RIGHT colon. Degree of small bowel dilation is not substantially changed compared to the prior CT evaluation accounting for differences in technique.    Findings of small-bowel obstruction without substantial change still with marked dilation of small bowel loops. Decompressed colon is noted and there is only a small amount of rectal gas.   Assessment/Plan: (ICD-10's: K9.609) 71 y.o. male with persistent small bowel obstruction likely secondary to adhesive disease from multiple previous intra-abdominal surgeries, there was evidence of swirling on initial CT however his examination is without peritonitis and he remains without leukocytosis nor evidence of pneumatosis on imaging.    - As a precaution, we will tentatively post him for laparotomy tomorrow (06/22) with Dr Dahlia Byes. His presentation and imaging findings are highly concerning, and he has failed to make significant improvements to this point. Patient and his wife are understanding. Fortunately, his examination and laboratory findings are reassuring, and there is no evidence for emergent intervention today. We will follow closely   - Continue NPO + IVF support  - Continue NGT to LIS; monitor and record output  - Morning KUB  - Monitor abdominal examination; on-going bowel function  - Monitor electrolytes while fasting  - Pain control prn; antiemetics prn   - Mobilization as tolerated   - Further management per primary service; we will follow    All of the above findings and recommendations were discussed with the patient, patient's family (wife at bedside), and the medical team, and all of patient's and family's questions were answered to their expressed satisfaction.  -- Edison Simon, PA-C [facility] 11/29/2021, 9:17 AM M-F: 7am - 4pm

## 2021-11-29 NOTE — Clinical Social Work Note (Signed)
  Transition of Care (TOC) Screening Note   Patient Details  Name: Tylin Force Ospina Date of Birth: May 21, 1951   Transition of Care [facility] CM/SW Contact:    Eileen Stanford, LCSW Phone [place] 11/29/2021, 3:11 PM    Transition of Care Department [facility]) has reviewed patient and no TOC needs have been identified at this time. We will continue to monitor patient advancement through interdisciplinary progression rounds. If new patient transition needs arise, please place a TOC consult.

## 2021-11-29 NOTE — Plan of Care (Signed)
  Problem: Coping: Goal: Ability to adjust to condition or change in health will improve Outcome: Progressing   Problem: Fluid Volume: Goal: Ability to maintain a balanced intake and output will improve Outcome: Progressing   Problem: Health Behavior/Discharge Planning: Goal: Ability to identify and utilize available resources and services will improve Outcome: Progressing   Problem: Health Behavior/Discharge Planning: Goal: Ability to manage health-related needs will improve Outcome: Progressing   Problem: Metabolic: Goal: Ability to maintain appropriate glucose levels will improve Outcome: Progressing   Problem: Nutritional: Goal: Maintenance of adequate nutrition will improve Outcome: Progressing   Problem: Nutritional: Goal: Progress toward achieving an optimal weight will improve Outcome: Progressing   Problem: Skin Integrity: Goal: Risk for impaired skin integrity will decrease Outcome: Progressing   Problem: Tissue Perfusion: Goal: Adequacy of tissue perfusion will improve Outcome: Progressing   Problem: Education: Goal: Knowledge of General Education information will improve Description: Including pain rating scale, medication(s)/side effects and non-pharmacologic comfort measures Outcome: Progressing   Problem: Health Behavior/Discharge Planning: Goal: Ability to manage health-related needs will improve Outcome: Progressing   Problem: Clinical Measurements: Goal: Ability to maintain clinical measurements within normal limits will improve Outcome: Progressing   Problem: Clinical Measurements: Goal: Will remain free from infection Outcome: Progressing   Problem: Clinical Measurements: Goal: Diagnostic test results will improve Outcome: Progressing   Problem: Clinical Measurements: Goal: Respiratory complications will improve Outcome: Progressing   Problem: Clinical Measurements: Goal: Cardiovascular complication will be avoided Outcome:  Progressing   Problem: Activity: Goal: Risk for activity intolerance will decrease Outcome: Progressing

## 2021-11-29 NOTE — Progress Notes (Signed)
PROGRESS NOTE    Randy Harper  HLK:[id number] DOB: 1951-02-16 DOA: 11/28/2021 PCP: Randy Billings, NP  Outpatient Specialists: GI, oncology    Brief Narrative:   From admission h and p 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   Assessment & Plan:   Principal Problem:   Small bowel obstruction (HCC) Active Problems:   Hypertension   Hyperlipidemia   BPH (benign prostatic hyperplasia)   Paroxysmal atrial flutter (HCC)   Hypertension associated with diabetes ([place])   Macular degeneration disease   Diabetes mellitus type 2, noninsulin dependent ([place])  # Small bowel obstruction Possibly mechanical. NG tube placed overnight. Pain is controlled. Abdomen is soft. No free air seen on today's KUB but neither does it show significant improvement. Not passing flatus or stool. Does have hx extensive intraabdominal surgery (partial liver resection with small bowel resection for neuroendocrine tumor, in 2018). No hx SBO. - gen surg folowing, possible surgery tomorrow - pain control - maintaining NG tube  # T2DM Here euglycemic - SSI  # HTN Normotensive - home dilt on hold, as is atorvastatin  # Hx a flutter Single episode, not anticoagulated - IV CCB as needed   DVT prophylaxis: SCDs Code Status: full Family Communication: wife updated @ bedside  Level of care: Telemetry Medical Status is: Observation The patient will require care spanning > 2 midnights and should be moved to inpatient because: need for inpatient care    Consultants:  Gen surg  Procedures: NG tube placement  Antimicrobials:  none    Subjective: Mild abd  pain, no nausea. No flatus or stool.   Objective: Vitals:   11/28/21 2355 11/29/21 0028 11/29/21 0312 11/29/21 0752  BP: 135/63  (!) 142/73 (!) 146/73  Pulse: (!) 163 (!) 108 99 100  Resp:   20 18  Temp:   98.5 F (36.9 C) 98.4 F (36.9 C)  TempSrc:      SpO2:   92% 92%  Weight:      Height:        Intake/Output Summary (Last 24 hours) at 11/29/2021 1000 Last data filed at 11/29/2021 0334 Gross per 24 hour  Intake 186.67 ml  Output --  Net 186.67 ml   Filed Weights   11/28/21 1821  Weight: 97.1 kg    Examination:  General exam: Appears calm and comfortable  Respiratory system: Clear to auscultation. Respiratory effort normal. Cardiovascular system: S1 & S2 heard, RRR. No JVD, murmurs, rubs, gallops or clicks. No pedal edema. Gastrointestinal system: Abdomen is distended, non-tender. Decreased bowel sounds Central nervous system: Alert and oriented. No focal neurological deficits. Extremities: Symmetric 5 x 5 power. Skin: No rashes, lesions or ulcers Psychiatry: Judgement and insight appear normal. Mood & affect appropriate.     Data Reviewed: I have personally reviewed following labs and imaging studies  CBC: Recent Labs  Lab 11/28/21 1834 11/29/21 0319  WBC 9.3 8.8  HGB 15.8 13.5  HCT 47.0 39.5  MCV 92.2 91.9  PLT 235 734   Basic Metabolic Panel: Recent Labs  Lab 11/28/21 1834 11/29/21 0319  NA 131* 134*  K 4.0 4.2  CL 93* 101  CO2 27 26  GLUCOSE 129* 116*  BUN 22 16  CREATININE 0.87  0.72  CALCIUM 9.4 9.0  MG  --  1.9   GFR: Estimated Creatinine Clearance: 92.9 mL/min (by C-G formula based on SCr of 0.72 mg/dL). Liver Function Tests: Recent Labs  Lab 11/29/21 0319  AST 18  ALT 17  ALKPHOS 74  BILITOT 1.0  PROT 6.6  ALBUMIN 3.8   No results for input(s): "LIPASE", "AMYLASE" in the last 168 hours. No results for input(s): "AMMONIA" in the last 168 hours. Coagulation Profile: No results for input(s): "INR", "PROTIME" in the last 168  hours. Cardiac Enzymes: No results for input(s): "CKTOTAL", "CKMB", "CKMBINDEX", "TROPONINI" in the last 168 hours. BNP (last 3 results) No results for input(s): "PROBNP" in the last 8760 hours. HbA1C: No results for input(s): "HGBA1C" in the last 72 hours. CBG: Recent Labs  Lab 11/28/21 2305  GLUCAP 109*   Lipid Profile: No results for input(s): "CHOL", "HDL", "LDLCALC", "TRIG", "CHOLHDL", "LDLDIRECT" in the last 72 hours. Thyroid Function Tests: No results for input(s): "TSH", "T4TOTAL", "FREET4", "T3FREE", "THYROIDAB" in the last 72 hours. Anemia Panel: No results for input(s): "VITAMINB12", "FOLATE", "FERRITIN", "TIBC", "IRON", "RETICCTPCT" in the last 72 hours. Urine analysis:    Component Value Date/Time   APPEARANCEUR Clear 08/09/2021 0820   GLUCOSEU Negative 08/09/2021 0820   BILIRUBINUR Negative 08/09/2021 0820   PROTEINUR Negative 08/09/2021 0820   NITRITE Negative 08/09/2021 0820   LEUKOCYTESUR Negative 08/09/2021 0820   Sepsis Labs: '@LABRCNTIP'$ (procalcitonin:4,lacticidven:4)  )No results found for this or any previous visit (from the past 240 hour(s)).       Radiology Studies: DG ABD ACUTE 2+V W 1V CHEST  Addendum Date: 11/29/2021   ADDENDUM REPORT: 11/29/2021 08:38 ADDENDUM: There are additional views provided of the abdomen. Air-fluid levels are noted throughout the abdomen involving small bowel loops. There is no free air beneath either the RIGHT or LEFT hemidiaphragm. EKG leads project over the abdomen. There is some gas in the rectum and a small amount of gas in the RIGHT colon. Degree of small bowel dilation is not substantially changed compared to the prior CT evaluation accounting for differences in technique. Findings of small-bowel obstruction without substantial change still with marked dilation of small bowel loops. Decompressed colon is noted and there is only a small amount of rectal gas. Electronically Signed   By: Zetta Bills M.D.   On:  11/29/2021 08:38   Result Date: 11/29/2021 CLINICAL DATA:  A 47 year [place] presents for evaluation of small-bowel obstruction in the setting of neoplasm. EXAM: DG ABDOMEN ACUTE WITH 1 VIEW CHEST COMPARISON:  June 05, 2021 FINDINGS: EKG leads project over the chest. Gastric tube in place tip in the proximal stomach, side-port near the GE junction. Cardiomediastinal contours and hilar structures are stable. No lobar consolidation. No sign of pleural effusion. No pneumothorax. On limited assessment there is no acute skeletal finding. Air-fluid levels in small bowel loops in the upper abdomen incidentally noted. IMPRESSION: 1. No acute cardiopulmonary disease. 2. Gastric tube in place tip in the proximal stomach, side-port near the GE junction. This could be advanced slightly for more optimal placement approximately 3-3-1/2 cm. 3. Signs of persistent small bowel dilation, incompletely assessed. Electronically Signed: By: Zetta Bills M.D. On: 11/29/2021 08:08   DG Abd 1 View  Result Date: 11/29/2021 CLINICAL DATA:  Check gastric catheter placement EXAM: ABDOMEN - 1 VIEW COMPARISON:  None Available. FINDINGS: Scattered large and small bowel gas is noted. Gastric catheter is noted within the stomach. No free air is seen. IMPRESSION: Gastric catheter in the  stomach. Electronically Signed   By: Inez Catalina M.D.   On: 11/29/2021 02:16   CT ABDOMEN PELVIS W CONTRAST  Result Date: 11/28/2021 CLINICAL DATA:  Bilateral lower quadrant pain with nausea and vomiting. EXAM: CT ABDOMEN AND PELVIS WITH CONTRAST TECHNIQUE: Multidetector CT imaging of the abdomen and pelvis was performed using the standard protocol following bolus administration of intravenous contrast. RADIATION DOSE REDUCTION: This exam was performed according to the departmental dose-optimization program which includes automated exposure control, adjustment of the mA and/or kV according to patient size and/or use of iterative reconstruction technique.  CONTRAST:  176m OMNIPAQUE IOHEXOL 300 MG/ML  SOLN COMPARISON:  June 06, 2021 FINDINGS: Lower chest: Mild linear scarring and/or atelectasis is seen within the posterior aspect of the right lung base. Hepatobiliary: Postoperative changes are seen consistent with prior right hepatectomy. No focal liver lesions are identified. Status post cholecystectomy. No biliary dilatation. Pancreas: Unremarkable. No pancreatic ductal dilatation or surrounding inflammatory changes. Spleen: Normal in size without focal abnormality. Adrenals/Urinary Tract: A stable 1.2 cm diameter low-attenuation left adrenal mass is seen (approximately 18.07 Hounsfield units). The right adrenal gland is unremarkable. Kidneys are normal in size, without renal calculi or hydronephrosis. A 6 mm focus of parenchymal low attenuation is seen within the posterior aspect of the mid right kidney. This is too small to characterize by CT examination. Mild, predominantly anterior urinary bladder wall thickening is seen. Stomach/Bowel: Stomach is within normal limits. Appendix appears normal. Numerous dilated small bowel loops are seen throughout the abdomen and pelvis (maximum small bowel diameter of approximately 5.0 cm). An abrupt transition zone is seen within the mid right abdomen (axial CT images 42 through 59, CT series 2). Surgically anastomosed bowel and mild mesenteric twisting are also seen within this region. Noninflamed diverticula are seen throughout the large bowel. Vascular/Lymphatic: Aortic atherosclerosis. No enlarged abdominal or pelvic lymph nodes. Reproductive: Prostate is unremarkable. Other: A 2.4 cm x 2.8 cm fat containing right-sided para umbilical hernia is noted. A trace amount of posterior pelvic free fluid is seen. No intra-abdominal free air is identified. Musculoskeletal: Multilevel degenerative changes are seen throughout the lumbar spine. IMPRESSION: 1. High-grade small bowel obstruction with a transition zone seen within  the mid right abdomen, at the level of the distal jejunum, along an area of mesenteric twisting and surgically anastomosed bowel. 2. Colonic diverticulosis. 3. Stable low-attenuation left adrenal adenoma. 4. Fat-containing right-sided para umbilical hernia. 5. Aortic atherosclerosis. Aortic Atherosclerosis (ICD10-I70.0). Electronically Signed   By: TVirgina NorfolkM.D.   On: 11/28/2021 17:20        Scheduled Meds:  insulin aspart  0-15 Units Subcutaneous TID WC   insulin aspart  0-5 Units Subcutaneous QHS   Continuous Infusions:  lactated ringers 150 mL/hr at 11/29/21 0806     LOS: 0 days     NDesma Maxim MD [facility]   If 7PM-7AM, please contact night-coverage [email or web address] Password [email or web address] 11/29/2021, 10:00 AM

## 2021-11-30 ENCOUNTER — Inpatient Hospital Stay: Payer: Medicare Other

## 2021-11-30 ENCOUNTER — Inpatient Hospital Stay: Payer: Medicare Other | Admitting: Certified Registered Nurse Anesthetist

## 2021-11-30 ENCOUNTER — Encounter
Admission: EM | Disposition: A | Discharge status: Home / Self Care | Payer: Self-pay | Source: Home / Self Care | Attending: Obstetrics and Gynecology

## 2021-11-30 ENCOUNTER — Other Ambulatory Visit: Payer: Self-pay

## 2021-11-30 ENCOUNTER — Encounter: Payer: Self-pay | Admitting: Obstetrics and Gynecology

## 2021-11-30 DIAGNOSIS — K56609 Unspecified intestinal obstruction, unspecified as to partial versus complete obstruction: Secondary | ICD-10-CM | POA: Diagnosis not present

## 2021-11-30 HISTORY — PX: LYSIS OF ADHESION: SHX5961

## 2021-11-30 HISTORY — PX: VENTRAL HERNIA REPAIR: SHX424

## 2021-11-30 HISTORY — PX: LAPAROTOMY: SHX154

## 2021-11-30 LAB — CBC
HCT: 39.7 % (ref 39.0–52.0)
Hemoglobin: 13.4 g/dL (ref 13.0–17.0)
MCH: 30.7 pg (ref 26.0–34.0)
MCHC: 33.8 g/dL (ref 30.0–36.0)
MCV: 91.1 fL (ref 80.0–100.0)
Platelets: 179 10*3/uL (ref 150–400)
RBC: 4.36 MIL/uL (ref 4.22–5.81)
RDW: 12.7 % (ref 11.5–15.5)
WBC: 8.8 10*3/uL (ref 4.0–10.5)
nRBC: 0 % (ref 0.0–0.2)

## 2021-11-30 LAB — BASIC METABOLIC PANEL
Anion gap: 8 (ref 5–15)
BUN: 15 mg/dL (ref 8–23)
CO2: 24 mmol/L (ref 22–32)
Calcium: 8.7 mg/dL — ABNORMAL LOW (ref 8.9–10.3)
Chloride: 104 mmol/L (ref 98–111)
Creatinine, Ser: 0.56 mg/dL — ABNORMAL LOW (ref 0.61–1.24)
GFR, Estimated: >60 mL/min (ref >60)
Glucose, Bld: 78 mg/dL (ref 70–99)
Potassium: 3.6 mmol/L (ref 3.5–5.1)
Sodium: 136 mmol/L (ref 135–145)

## 2021-11-30 LAB — GLUCOSE, CAPILLARY
Glucose-Capillary: 78 mg/dL (ref 70–99)
Glucose-Capillary: 76 mg/dL (ref 70–99)
Glucose-Capillary: 173 mg/dL — ABNORMAL HIGH (ref 70–99)
Glucose-Capillary: 172 mg/dL — ABNORMAL HIGH (ref 70–99)
Glucose-Capillary: 135 mg/dL — ABNORMAL HIGH (ref 70–99)

## 2021-11-30 SURGERY — LAPAROTOMY, EXPLORATORY
Anesthesia: General

## 2021-11-30 MED ORDER — SUGAMMADEX SODIUM 200 MG/2ML IV SOLN
INTRAVENOUS | Status: DC | PRN
  Administered 2021-11-30: 200 mg via INTRAVENOUS

## 2021-11-30 MED ORDER — SODIUM CHLORIDE 0.9 % IV SOLN: 2.0000 g | INTRAVENOUS | Status: AC

## 2021-11-30 MED ORDER — SODIUM CHLORIDE 0.9 % IV SOLN
INTRAVENOUS | Status: AC
  Filled 2021-11-30: qty 2

## 2021-11-30 MED ORDER — ONDANSETRON HCL 4 MG/2ML IJ SOLN
INTRAMUSCULAR | Status: AC
  Filled 2021-11-30: qty 2

## 2021-11-30 MED ORDER — HYDROMORPHONE HCL 1 MG/ML IJ SOLN
INTRAMUSCULAR | Status: DC | PRN
  Administered 2021-11-30: .5 mg via INTRAVENOUS

## 2021-11-30 MED ORDER — ONDANSETRON HCL 4 MG/2ML IJ SOLN
INTRAMUSCULAR | Status: DC | PRN
  Administered 2021-11-30: 4 mg via INTRAVENOUS

## 2021-11-30 MED ORDER — PROPOFOL 10 MG/ML IV BOLUS
INTRAVENOUS | Status: DC | PRN
  Administered 2021-11-30: 130 mg via INTRAVENOUS

## 2021-11-30 MED ORDER — LACTATED RINGERS IV SOLN: INTRAVENOUS | Status: DC | PRN

## 2021-11-30 MED ORDER — ACETAMINOPHEN 10 MG/ML IV SOLN
1000.0000 mg | Freq: Four times a day (QID) | INTRAVENOUS | Status: AC
  Administered 2021-11-30 – 2021-12-01 (×4): 1000 mg via INTRAVENOUS
  Filled 2021-11-30 (×4): qty 100

## 2021-11-30 MED ORDER — ACETAMINOPHEN 10 MG/ML IV SOLN: 1000.0000 mg | Freq: Once | INTRAVENOUS | Status: DC | PRN

## 2021-11-30 MED ORDER — LABETALOL HCL 5 MG/ML IV SOLN
INTRAVENOUS | Status: AC
  Filled 2021-11-30: qty 4

## 2021-11-30 MED ORDER — SODIUM CHLORIDE (PF) 0.9 % IJ SOLN
INTRAMUSCULAR | Status: AC
  Filled 2021-11-30: qty 50

## 2021-11-30 MED ORDER — KCL IN DEXTROSE-NACL 20-5-0.9 MEQ/L-%-% IV SOLN
INTRAVENOUS | Status: AC
  Filled 2021-11-30 (×8): qty 1000

## 2021-11-30 MED ORDER — CHLORHEXIDINE GLUCONATE CLOTH 2 % EX PADS
6.0000 | MEDICATED_PAD | Freq: Every day | CUTANEOUS | Status: DC
  Administered 2021-11-30 – 2021-12-06 (×7): 6 via TOPICAL

## 2021-11-30 MED ORDER — BUPIVACAINE-EPINEPHRINE (PF) 0.25% -1:200000 IJ SOLN
INTRAMUSCULAR | Status: AC
  Filled 2021-11-30: qty 30

## 2021-11-30 MED ORDER — LACTATED RINGERS IV SOLN: INTRAVENOUS | Status: DC

## 2021-11-30 MED ORDER — PHENYLEPHRINE HCL-NACL 20-0.9 MG/250ML-% IV SOLN
INTRAVENOUS | Status: AC
  Filled 2021-11-30: qty 250

## 2021-11-30 MED ORDER — SEPRAFILM FOR OPTIME
ORAL_FILM | TOPICAL | Status: DC | PRN
  Administered 2021-11-30: 2 via TOPICAL
  Administered 2021-11-30: 1 via TOPICAL

## 2021-11-30 MED ORDER — PHENYLEPHRINE HCL (PRESSORS) 10 MG/ML IV SOLN
INTRAVENOUS | Status: DC | PRN
  Administered 2021-11-30 (×2): 160 ug via INTRAVENOUS

## 2021-11-30 MED ORDER — DEXAMETHASONE SODIUM PHOSPHATE 10 MG/ML IJ SOLN
INTRAMUSCULAR | Status: AC
  Filled 2021-11-30: qty 1

## 2021-11-30 MED ORDER — FENTANYL CITRATE (PF) 100 MCG/2ML IJ SOLN
25.0000 ug | INTRAMUSCULAR | Status: DC | PRN
  Administered 2021-11-30 (×3): 25 ug via INTRAVENOUS

## 2021-11-30 MED ORDER — FENTANYL CITRATE (PF) 100 MCG/2ML IJ SOLN
INTRAMUSCULAR | Status: AC
  Filled 2021-11-30: qty 2

## 2021-11-30 MED ORDER — ENOXAPARIN SODIUM 60 MG/0.6ML IJ SOSY
0.5000 mg/kg | PREFILLED_SYRINGE | INTRAMUSCULAR | Status: DC
  Administered 2021-12-01 – 2021-12-06 (×5): 47.5 mg via SUBCUTANEOUS
  Filled 2021-11-30 (×3): qty 0.6
  Filled 2021-11-30: qty 0.47
  Filled 2021-11-30 (×2): qty 0.6

## 2021-11-30 MED ORDER — ALBUMIN HUMAN 5 % IV SOLN: INTRAVENOUS | Status: DC | PRN

## 2021-11-30 MED ORDER — DEXTROSE-NACL 5-0.45 % IV SOLN: INTRAVENOUS | Status: DC

## 2021-11-30 MED ORDER — 0.9 % SODIUM CHLORIDE (POUR BTL) OPTIME
TOPICAL | Status: DC | PRN
  Administered 2021-11-30: 1000 mL
  Administered 2021-11-30: 2000 mL

## 2021-11-30 MED ORDER — LIDOCAINE HCL (CARDIAC) PF 100 MG/5ML IV SOSY
PREFILLED_SYRINGE | INTRAVENOUS | Status: DC | PRN
  Administered 2021-11-30: 60 mg via INTRAVENOUS

## 2021-11-30 MED ORDER — OXYCODONE HCL 5 MG PO TABS: 5.0000 mg | ORAL_TABLET | Freq: Once | ORAL | Status: DC | PRN

## 2021-11-30 MED ORDER — SODIUM CHLORIDE (PF) 0.9 % IJ SOLN
INTRAMUSCULAR | Status: DC | PRN
  Administered 2021-11-30: 100 mL

## 2021-11-30 MED ORDER — ROCURONIUM BROMIDE 100 MG/10ML IV SOLN
INTRAVENOUS | Status: DC | PRN
  Administered 2021-11-30: 30 mg via INTRAVENOUS
  Administered 2021-11-30: 5 mg via INTRAVENOUS
  Administered 2021-11-30: 45 mg via INTRAVENOUS

## 2021-11-30 MED ORDER — SUCCINYLCHOLINE CHLORIDE 200 MG/10ML IV SOSY
PREFILLED_SYRINGE | INTRAVENOUS | Status: DC | PRN
  Administered 2021-11-30: 100 mg via INTRAVENOUS

## 2021-11-30 MED ORDER — PHENYLEPHRINE 80 MCG/ML (10ML) SYRINGE FOR IV PUSH (FOR BLOOD PRESSURE SUPPORT)
PREFILLED_SYRINGE | INTRAVENOUS | Status: AC
  Filled 2021-11-30: qty 10

## 2021-11-30 MED ORDER — ONDANSETRON HCL 4 MG/2ML IJ SOLN: 4.0000 mg | Freq: Once | INTRAMUSCULAR | Status: DC | PRN

## 2021-11-30 MED ORDER — KETOROLAC TROMETHAMINE 15 MG/ML IJ SOLN
15.0000 mg | Freq: Four times a day (QID) | INTRAMUSCULAR | Status: AC
  Administered 2021-11-30 – 2021-12-05 (×20): 15 mg via INTRAVENOUS
  Filled 2021-11-30 (×21): qty 1

## 2021-11-30 MED ORDER — FENTANYL CITRATE (PF) 100 MCG/2ML IJ SOLN
INTRAMUSCULAR | Status: AC
  Administered 2021-11-30: 25 ug via INTRAVENOUS
  Filled 2021-11-30: qty 2

## 2021-11-30 MED ORDER — ALBUMIN HUMAN 5 % IV SOLN
INTRAVENOUS | Status: AC
  Filled 2021-11-30: qty 250

## 2021-11-30 MED ORDER — BUPIVACAINE LIPOSOME 1.3 % IJ SUSP
INTRAMUSCULAR | Status: AC
  Filled 2021-11-30: qty 20

## 2021-11-30 MED ORDER — PHENYLEPHRINE HCL-NACL 20-0.9 MG/250ML-% IV SOLN
INTRAVENOUS | Status: DC | PRN
  Administered 2021-11-30: 50 ug/min via INTRAVENOUS

## 2021-11-30 MED ORDER — OXYCODONE HCL 5 MG/5ML PO SOLN: 5.0000 mg | Freq: Once | ORAL | Status: DC | PRN

## 2021-11-30 MED ORDER — LABETALOL HCL 5 MG/ML IV SOLN
INTRAVENOUS | Status: DC | PRN
  Administered 2021-11-30: 10 mg via INTRAVENOUS

## 2021-11-30 MED ORDER — FENTANYL CITRATE (PF) 100 MCG/2ML IJ SOLN
INTRAMUSCULAR | Status: DC | PRN
  Administered 2021-11-30 (×2): 50 ug via INTRAVENOUS

## 2021-11-30 MED ORDER — HYDROMORPHONE HCL 1 MG/ML IJ SOLN
INTRAMUSCULAR | Status: AC
  Filled 2021-11-30: qty 1

## 2021-11-30 SURGICAL SUPPLY — 40 items
BARRIER ADH SEPRAFILM 3INX5IN (MISCELLANEOUS) ×3 IMPLANT
BLADE CLIPPER SURG (BLADE) ×2 IMPLANT
BLADE SURG SZ10 CARB STEEL (BLADE) ×2 IMPLANT
CHLORAPREP W/TINT 26 (MISCELLANEOUS) ×2 IMPLANT
COVER BACK TABLE REUSABLE LG (DRAPES) ×2 IMPLANT
DRAPE LAPAROTOMY 100X77 ABD (DRAPES) ×2 IMPLANT
DRSG OPSITE POSTOP 4X12 (GAUZE/BANDAGES/DRESSINGS) ×1 IMPLANT
ELECT BLADE 6.5 EXT (BLADE) ×2 IMPLANT
ELECT CAUTERY BLADE 6.4 (BLADE) ×1 IMPLANT
ELECT REM PT RETURN 9FT ADLT (ELECTROSURGICAL) ×2
ELECTRODE REM PT RTRN 9FT ADLT (ELECTROSURGICAL) ×1 IMPLANT
GAUZE 4X4 16PLY ~~LOC~~+RFID DBL (SPONGE) ×2 IMPLANT
GLOVE BIO SURGEON STRL SZ7 (GLOVE) ×2 IMPLANT
GOWN STRL REUS W/ TWL LRG LVL3 (GOWN DISPOSABLE) ×2 IMPLANT
GOWN STRL REUS W/TWL LRG LVL3 (GOWN DISPOSABLE) ×2
HANDLE SUCTION POOLE (INSTRUMENTS) ×1 IMPLANT
HANDLE YANKAUER SUCT BULB TIP (MISCELLANEOUS) ×2 IMPLANT
LIGASURE IMPACT 36 18CM CVD LR (INSTRUMENTS) ×1 IMPLANT
MANIFOLD NEPTUNE II (INSTRUMENTS) ×2 IMPLANT
NEEDLE HYPO 22GX1.5 SAFETY (NEEDLE) ×4 IMPLANT
NS IRRIG 1000ML POUR BTL (IV SOLUTION) ×3 IMPLANT
PACK BASIN MAJOR ARMC (MISCELLANEOUS) ×2 IMPLANT
SPONGE T-LAP 18X18 ~~LOC~~+RFID (SPONGE) ×7 IMPLANT
SPONGE T-LAP 18X36 ~~LOC~~+RFID STR (SPONGE) ×1 IMPLANT
STAPLER SKIN PROX 35W (STAPLE) ×2 IMPLANT
SUCTION POOLE HANDLE (INSTRUMENTS) ×2
SUT PDS AB 0 CT1 27 (SUTURE) ×6 IMPLANT
SUT SILK 2 0 (SUTURE) ×1
SUT SILK 2 0 SH CR/8 (SUTURE) ×2 IMPLANT
SUT SILK 2 0SH CR/8 30 (SUTURE) ×2 IMPLANT
SUT SILK 2-0 18XBRD TIE 12 (SUTURE) ×1 IMPLANT
SUT VIC AB 0 CT1 36 (SUTURE) ×4 IMPLANT
SUT VIC AB 2-0 SH 27 (SUTURE) ×2
SUT VIC AB 2-0 SH 27XBRD (SUTURE) ×2 IMPLANT
SUT VIC AB 3-0 SH 27 (SUTURE) ×1
SUT VIC AB 3-0 SH 27X BRD (SUTURE) ×1 IMPLANT
SYR 30ML LL (SYRINGE) ×4 IMPLANT
SYR 3ML LL SCALE MARK (SYRINGE) ×2 IMPLANT
TRAY FOLEY MTR SLVR 16FR STAT (SET/KITS/TRAYS/PACK) ×2 IMPLANT
WATER STERILE IRR 500ML POUR (IV SOLUTION) ×2 IMPLANT

## 2021-11-30 NOTE — Progress Notes (Signed)
PHARMACIST - PHYSICIAN COMMUNICATION  CONCERNING:  Enoxaparin (Lovenox) for DVT Prophylaxis    RECOMMENDATION: Patient was prescribed enoxaprin '40mg'$  q24 hours for VTE prophylaxis.   Filed Weights   11/28/21 1821 11/30/21 1320  Weight: 97.1 kg (214 lb) 97.1 kg (214 lb 1.1 oz)    Body mass index is 32.55 kg/m.  Estimated Creatinine Clearance: 92.9 mL/min (A) (by C-G formula based on SCr of 0.56 mg/dL (L)).   Based on [place] patient is candidate for enoxaparin 0.'5mg'$ /kg TBW SQ every 24 hours based on BMI being >30.  DESCRIPTION: Pharmacy has adjusted enoxaparin dose per [facility] policy.  Patient is now receiving enoxaparin 0.5 mg/kg every 24 hours   Renda Rolls, PharmD, [facility] 11/30/2021 5:08 PM

## 2021-11-30 NOTE — Progress Notes (Signed)
PROGRESS NOTE    Randy Harper  PYK:[id number] DOB: [birth year] DOA: 11/28/2021 PCP: Jon Billings, NP  Outpatient Specialists: GI, oncology    Brief Narrative:   From admission h and p 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   Assessment & Plan:   Principal Problem:   Small bowel obstruction (HCC) Active Problems:   Hypertension   Hyperlipidemia   BPH (benign prostatic hyperplasia)   Paroxysmal atrial flutter (HCC)   Hypertension associated with diabetes ([place])   Macular degeneration disease   Diabetes mellitus type 2, noninsulin dependent (HCC)   SBO (small bowel obstruction) ([name])  # Small bowel obstruction Possibly mechanical. NG tube placed on presentation. Did not improve with conservative mgmt so patient brought to OR today where mechanical sbo was confirmed 2/2 significant inatraabdominal adhesive burden. Lysis of those adhesions was performed.  - gen surg folowing, diet per them - pain control - pt/ot tomorrow, resume dvt ppx  # T2DM Here euglycemic - SSI  # HTN Normotensive - home dilt on hold, as is atorvastatin  # Hx a flutter Single previous episode, not anticoagulated at home - IV CCB as needed   DVT prophylaxis: SCDs Code Status: full Family Communication: wife updated @ bedside 6/22. Left message with her today  Level of care: Med-Surg Status is: Observation The patient will require care spanning > 2 midnights and should be moved to inpatient because: need for inpatient surgery    Consultants:  Gen surg  Procedures: NG tube placement, ex lap with lysis of adhesions  Antimicrobials:  none     Subjective: Tolerated surgery  Objective: Vitals:   11/30/21 1645 11/30/21 1650 11/30/21 1655 11/30/21 1700  BP:  (!) 155/75  (!) 155/60  Pulse: 86 85 87 93  Resp: 19 (!) 22 (!) 21 17  Temp:      TempSrc:      SpO2: 97% 96% 96% 97%  Weight:      Height:        Intake/Output Summary (Last 24 hours) at 11/30/2021 1704 Last data filed at 11/30/2021 1639 Gross per 24 hour  Intake 1950 ml  Output 1700 ml  Net 250 ml   Filed Weights   11/28/21 1821 11/30/21 1320  Weight: 97.1 kg 97.1 kg    Examination:  General exam: Appears calm and comfortable  Respiratory system: Clear to auscultation. Respiratory effort normal. Cardiovascular system: S1 & S2 heard, RRR. No JVD, murmurs, rubs, gallops or clicks. No pedal edema. Gastrointestinal system: Abdomen is distended, non-tender. Decreased bowel sounds Central nervous system: Alert and oriented. No focal neurological deficits. Extremities: Symmetric 5 x 5 power. Skin: No rashes, lesions or ulcers Psychiatry: Judgement and insight appear normal. Mood & affect appropriate.     Data Reviewed: I have personally reviewed following labs and imaging studies  CBC: Recent Labs  Lab 11/28/21 1834 11/29/21 0319 11/30/21 0401  WBC 9.3 8.8 8.8  HGB 15.8 13.5 13.4  HCT 47.0 39.5 39.7  MCV 92.2 91.9 91.1  PLT 235 181 539   Basic Metabolic Panel: Recent Labs  Lab 11/28/21 1834 11/29/21 0319 11/30/21 0401  NA 131* 134* 136  K 4.0 4.2 3.6  CL 93* 101 104  CO2 27 26 24  GLUCOSE 129* 116* 78  BUN '22 16 15  '$ CREATININE 0.87 0.72 0.56*  CALCIUM 9.4 9.0 8.7*  MG  --  1.9  --    GFR: Estimated Creatinine Clearance: 92.9 mL/min (A) (by C-G formula based on SCr of 0.56 mg/dL (L)). Liver Function Tests: Recent Labs  Lab 11/29/21 0319  AST 18  ALT 17  ALKPHOS 74  BILITOT 1.0  PROT 6.6  ALBUMIN 3.8   No results for input(s): "LIPASE", "AMYLASE" in the last 168 hours. No results for input(s): "AMMONIA" in the last 168  hours. Coagulation Profile: No results for input(s): "INR", "PROTIME" in the last 168 hours. Cardiac Enzymes: No results for input(s): "CKTOTAL", "CKMB", "CKMBINDEX", "TROPONINI" in the last 168 hours. BNP (last 3 results) No results for input(s): "PROBNP" in the last 8760 hours. HbA1C: No results for input(s): "HGBA1C" in the last 72 hours. CBG: Recent Labs  Lab 11/29/21 1633 11/29/21 2024 11/30/21 0804 11/30/21 1111 11/30/21 1639  GLUCAP 90 84 78 76 173*   Lipid Profile: No results for input(s): "CHOL", "HDL", "LDLCALC", "TRIG", "CHOLHDL", "LDLDIRECT" in the last 72 hours. Thyroid Function Tests: No results for input(s): "TSH", "T4TOTAL", "FREET4", "T3FREE", "THYROIDAB" in the last 72 hours. Anemia Panel: No results for input(s): "VITAMINB12", "FOLATE", "FERRITIN", "TIBC", "IRON", "RETICCTPCT" in the last 72 hours. Urine analysis:    Component Value Date/Time   APPEARANCEUR Clear 08/09/2021 0820   GLUCOSEU Negative 08/09/2021 0820   BILIRUBINUR Negative 08/09/2021 0820   PROTEINUR Negative 08/09/2021 0820   NITRITE Negative 08/09/2021 0820   LEUKOCYTESUR Negative 08/09/2021 0820   Sepsis Labs: '@LABRCNTIP'$ (procalcitonin:4,lacticidven:4)  )No results found for this or any previous visit (from the past 240 hour(s)).       Radiology Studies: DG ABD ACUTE 2+V W 1V CHEST  Result Date: 11/30/2021 CLINICAL DATA:  Small bowel obstruction. EXAM: DG ABDOMEN ACUTE WITH 1 VIEW CHEST COMPARISON:  One view chest x-ray FINDINGS: Heart size normal. Atherosclerotic calcifications are present at the aortic arch. NG tube is in place. Dilated loops of small bowel persist, improved. Gas is present in the colon. No fluid levels are present on the upright images. No free air is present. IMPRESSION: 1. Improving small bowel obstruction. 2. NG tube in place. 3. No acute cardiopulmonary disease. 4. Aortic atherosclerosis. Electronically Signed   By: San Morelle M.D.   On: 11/30/2021  08:28   DG ABD ACUTE 2+V W 1V CHEST  Addendum Date: 11/29/2021   ADDENDUM REPORT: 11/29/2021 08:38 ADDENDUM: There are additional views provided of the abdomen. Air-fluid levels are noted throughout the abdomen involving small bowel loops. There is no free air beneath either the RIGHT or LEFT hemidiaphragm. EKG leads project over the abdomen. There is some gas in the rectum and a small amount of gas in the RIGHT colon. Degree of small bowel dilation is not substantially changed compared to the prior CT evaluation accounting for differences in technique. Findings of small-bowel obstruction without substantial change still with marked dilation of small bowel loops. Decompressed colon is noted and there is only a small amount of rectal gas. Electronically Signed   By: Zetta Bills M.D.   On: 11/29/2021 08:38   Result Date: 11/29/2021 CLINICAL DATA:  A 43 year [place] presents for evaluation of small-bowel obstruction in the setting of neoplasm. EXAM: DG ABDOMEN ACUTE WITH 1 VIEW CHEST COMPARISON:  June 05, 2021 FINDINGS: EKG leads project over the chest. Gastric tube in place tip in the proximal stomach, side-port near the GE junction. Cardiomediastinal  contours and hilar structures are stable. No lobar consolidation. No sign of pleural effusion. No pneumothorax. On limited assessment there is no acute skeletal finding. Air-fluid levels in small bowel loops in the upper abdomen incidentally noted. IMPRESSION: 1. No acute cardiopulmonary disease. 2. Gastric tube in place tip in the proximal stomach, side-port near the GE junction. This could be advanced slightly for more optimal placement approximately 3-3-1/2 cm. 3. Signs of persistent small bowel dilation, incompletely assessed. Electronically Signed: By: Zetta Bills M.D. On: 11/29/2021 08:08   DG Abd 1 View  Result Date: 11/29/2021 CLINICAL DATA:  Check gastric catheter placement EXAM: ABDOMEN - 1 VIEW COMPARISON:  None Available. FINDINGS: Scattered  large and small bowel gas is noted. Gastric catheter is noted within the stomach. No free air is seen. IMPRESSION: Gastric catheter in the stomach. Electronically Signed   By: Inez Catalina M.D.   On: 11/29/2021 02:16   CT ABDOMEN PELVIS W CONTRAST  Result Date: 11/28/2021 CLINICAL DATA:  Bilateral lower quadrant pain with nausea and vomiting. EXAM: CT ABDOMEN AND PELVIS WITH CONTRAST TECHNIQUE: Multidetector CT imaging of the abdomen and pelvis was performed using the standard protocol following bolus administration of intravenous contrast. RADIATION DOSE REDUCTION: This exam was performed according to the departmental dose-optimization program which includes automated exposure control, adjustment of the mA and/or kV according to patient size and/or use of iterative reconstruction technique. CONTRAST:  152m OMNIPAQUE IOHEXOL 300 MG/ML  SOLN COMPARISON:  June 06, 2021 FINDINGS: Lower chest: Mild linear scarring and/or atelectasis is seen within the posterior aspect of the right lung base. Hepatobiliary: Postoperative changes are seen consistent with prior right hepatectomy. No focal liver lesions are identified. Status post cholecystectomy. No biliary dilatation. Pancreas: Unremarkable. No pancreatic ductal dilatation or surrounding inflammatory changes. Spleen: Normal in size without focal abnormality. Adrenals/Urinary Tract: A stable 1.2 cm diameter low-attenuation left adrenal mass is seen (approximately 18.07 Hounsfield units). The right adrenal gland is unremarkable. Kidneys are normal in size, without renal calculi or hydronephrosis. A 6 mm focus of parenchymal low attenuation is seen within the posterior aspect of the mid right kidney. This is too small to characterize by CT examination. Mild, predominantly anterior urinary bladder wall thickening is seen. Stomach/Bowel: Stomach is within normal limits. Appendix appears normal. Numerous dilated small bowel loops are seen throughout the abdomen and  pelvis (maximum small bowel diameter of approximately 5.0 cm). An abrupt transition zone is seen within the mid right abdomen (axial CT images 42 through 59, CT series 2). Surgically anastomosed bowel and mild mesenteric twisting are also seen within this region. Noninflamed diverticula are seen throughout the large bowel. Vascular/Lymphatic: Aortic atherosclerosis. No enlarged abdominal or pelvic lymph nodes. Reproductive: Prostate is unremarkable. Other: A 2.4 cm x 2.8 cm fat containing right-sided para umbilical hernia is noted. A trace amount of posterior pelvic free fluid is seen. No intra-abdominal free air is identified. Musculoskeletal: Multilevel degenerative changes are seen throughout the lumbar spine. IMPRESSION: 1. High-grade small bowel obstruction with a transition zone seen within the mid right abdomen, at the level of the distal jejunum, along an area of mesenteric twisting and surgically anastomosed bowel. 2. Colonic diverticulosis. 3. Stable low-attenuation left adrenal adenoma. 4. Fat-containing right-sided para umbilical hernia. 5. Aortic atherosclerosis. Aortic Atherosclerosis (ICD10-I70.0). Electronically Signed   By: TVirgina NorfolkM.D.   On: 11/28/2021 17:20        Scheduled Meds:  fentaNYL       [MAR Hold] insulin aspart  0-15  Units Subcutaneous TID WC   [MAR Hold] insulin aspart  0-5 Units Subcutaneous QHS   Continuous Infusions:  acetaminophen     [MAR Hold] cefoTEtan (CEFOTAN) IV     dextrose 5 % and 0.45% NaCl 75 mL/hr at 11/30/21 1207   lactated ringers     sodium chloride 0.9 % with cefoTEtan (CEFOTAN) ADS Med       LOS: 1 day     Desma Maxim, MD [facility]   If 7PM-7AM, please contact night-coverage [email or web address] Password [facility] 11/30/2021, 5:04 PM

## 2021-11-30 NOTE — Op Note (Signed)
PROCEDURES: Exploratory laparotomy and lysis of adhesions Ventral hernia repair 4 cms reducible   Pre-operative Diagnosis:SBO  Post-operative Diagnosis: Same  Surgeon: Marjory Lies Zuriel Roskos   Assistants: Dr. Hampton Abbot ( required due to the complexity of the case  Anesthesia: General endotracheal anesthesia  ASA Class: 2  Surgeon: Caroleen Hamman , MD FACS  Anesthesia: Gen. with endotracheal tube  Findings: Complete SBO from adhesion, patent anastomosis Distended and injected bowel but viable Widely patent Hugh anastomosis  Estimated Blood Loss: 75cc              Complications: none               Condition: stable  Procedure Details  The patient was seen again in the Holding Room. The benefits, complications, treatment options, and expected outcomes were discussed with the patient. The risks of bleeding, infection, recurrence of symptoms, failure to resolve symptoms,  bowel injury, any of which could require further surgery were reviewed with the patient.   The patient was taken to Operating Room, identified as Randy Harper and the procedure verified.  A Time Out was held and the above information confirmed.  Prior to the induction of general anesthesia, antibiotic prophylaxis was administered. VTE prophylaxis was in place. General endotracheal anesthesia was then administered and tolerated well. After the induction, the abdomen was prepped with Chloraprep and draped in the sterile fashion. The patient was positioned in the supine position.  Serous midline laparotomy was performed with 10 blade knife.  We started below the prior laparotomy incision and on top of ventral hernia.  The ventral hernia was visualized the sac was dissected and excised.  We entered the abdominal cavity after elevating the fascia between 2 Coker's and incising the fascia sharply.  There were no adhesions on her first approach to abdominal cavity.  Electrocautery was used to lengthen with a full-thickness incision.   There was extensive adhesions from the omentum to the abdominal wall that were lysed with a combination of cautery and LigaSure device.  There was no bowel stuck to the abdominal wall.  The adhesions were pretty vascularized and we lost some blood from those.  The lyse of adhesions was Leigh's an hour or so to restore the anatomy.  Once we lyse adhesions we inspected the abdomen and there was severely dilated loops of small bowel.  I felt deep into the abdominal cavity onto the right side failed the mass effect of the small bowel causing obstruction.  There was an adhesion causing the bowel to be twisted on its own producing a complete bowel obstruction.  We lysis adhesions and restore the appropriate anatomy.  The patient was next to the anastomosis but the anastomosis was widely patent.  The bowel was injected from the inflammatory response but it was viable and no need for resection was needed.  Did have some oozing from the right of the abdominal wall coming from the omental adhesions were able to control it appropriately.  Please note that Dr. Hampton Abbot was essential for exposure and for his expertise especially for the bleeding omental adhesions.  He was able to retract, irrigated and sucked to be able to perform a safe operation. We milked the contents of the small bowel proximally into the stomach and a total of 1000 cc of bilious fluid was removed from the stomach via the NG tube. Pelvis was used to perform abdominal blocks bilaterally and we placed multiple layers of Seprafilm to prevent adhesions.  The fascia and the  hernia defect were closed with a running 0 PDS using a small bite technique.   Needle and laparotomy count were correct and there were no immediate complications  Caroleen Hamman, MD, FACS

## 2021-11-30 NOTE — Transfer of Care (Signed)
Immediate Anesthesia Transfer of Care Note  Patient: Randy Harper  Procedure(s) Performed: EXPLORATORY LAPAROTOMY LYSIS OF ADHESION HERNIA REPAIR VENTRAL ADULT  Patient Location: PACU  Anesthesia Type:General  Level of Consciousness: awake, drowsy and patient cooperative  Airway & Oxygen Therapy: Patient Spontanous Breathing and Patient connected to face mask oxygen  Post-op Assessment: Report given to RN and Post -op Vital signs reviewed and stable  Post vital signs: Reviewed and stable  Last Vitals:  Vitals Value Taken Time  BP 121/66 11/30/21 1635  Temp    Pulse 91 11/30/21 1639  Resp 20 11/30/21 1639  SpO2 97 % 11/30/21 1639  Vitals shown include unvalidated device data.  Last Pain:  Vitals:   11/30/21 1320  TempSrc: Temporal  PainSc: 0-No pain      Patients Stated Pain Goal: 2 (63/14/97 0263)  Complications: No notable events documented.

## 2021-11-30 NOTE — Anesthesia Preprocedure Evaluation (Signed)
Anesthesia Evaluation    Airway Mallampati: IV       Dental   Pulmonary           Cardiovascular hypertension, + dysrhythmias (a flutter; SVT)   ECG 11/29/21:  Sinus tachycardia with Premature atrial complexes Right bundle branch block  Echo 10/04/20:  NORMAL LEFT VENTRICULAR SYSTOLIC FUNCTION  NORMAL RIGHT VENTRICULAR SYSTOLIC FUNCTION  MILD VALVULAR REGURGITATION   NO VALVULAR STENOSIS   Myocardial perfusion 08/23/20:  Normal treadmill EKG without evidence of myocardial ischemia with bundle branch block  Normal myocardial perfusion without evidence of myocardial ischemia    Neuro/Psych    GI/Hepatic Metastatic neuroendocrine liver cancer   Endo/Other  diabetes, Type 2Obesity   Renal/GU    BPH    Musculoskeletal Ankylosing spondylitis   Abdominal   Peds  Hematology Hx PE   Anesthesia Other Findings Cardiology note 09/21/21:  1. Paroxysmal atrial flutter: With no recurrences within the last 5 years. -Continue diltiazem 240 mg daily for heart rate control and maintenance of normal sinus rhythm -No anticoagulation at this time due to isolated episode with no recent recurrences  2. Hypertension: Currently stable in the office today at 130/82 -Continue diltiazem with no changes  3. Hyperlipidemia: With last lipid panel showing an LDL of 98 -Continue atorvastatin 80 mg daily with no changes  4. Atherosclerosis of abdominal aorta: By CT scan -Continue atorvastatin, diltiazem with no changes   Reproductive/Obstetrics                             Anesthesia Physical Anesthesia Plan  ASA: 3 and emergent  Anesthesia Plan:    Post-op Pain Management:    Induction:   PONV Risk Score and Plan:   Airway Management Planned:   Additional Equipment:   Intra-op Plan:   Post-operative Plan:   Informed Consent:   Plan Discussed with:   Anesthesia Plan Comments:          Anesthesia Quick Evaluation

## 2021-11-30 NOTE — Anesthesia Procedure Notes (Addendum)
Procedure Name: Intubation Date/Time: 11/30/2021 2:26 PM  Performed by: Jonna Clark, CRNAPre-anesthesia Checklist: Patient identified, Patient being monitored, Timeout performed, Emergency Drugs available and Suction available Patient Re-evaluated:Patient Re-evaluated prior to induction Oxygen Delivery Method: Circle system utilized Preoxygenation: Pre-oxygenation with 100% oxygen Induction Type: IV induction Ventilation: Mask ventilation without difficulty Laryngoscope Size: 4 and McGraph Grade View: Grade I Tube type: Oral Tube size: 7.5 mm Number of attempts: 1 Airway Equipment and Method: Stylet Placement Confirmation: ETT inserted through vocal cords under direct vision, positive ETCO2 and breath sounds checked- equal and bilateral Secured at: 22 cm Tube secured with: Tape Dental Injury: Teeth and Oropharynx as per pre-operative assessment

## 2021-12-01 ENCOUNTER — Encounter: Payer: Self-pay | Admitting: Surgery

## 2021-12-01 ENCOUNTER — Inpatient Hospital Stay: Payer: Self-pay

## 2021-12-01 DIAGNOSIS — K56609 Unspecified intestinal obstruction, unspecified as to partial versus complete obstruction: Secondary | ICD-10-CM | POA: Diagnosis not present

## 2021-12-01 LAB — CBC
HCT: 40.4 % (ref 39.0–52.0)
Hemoglobin: 13.8 g/dL (ref 13.0–17.0)
MCH: 30.9 pg (ref 26.0–34.0)
MCHC: 34.2 g/dL (ref 30.0–36.0)
MCV: 90.6 fL (ref 80.0–100.0)
Platelets: 172 10*3/uL (ref 150–400)
RBC: 4.46 MIL/uL (ref 4.22–5.81)
RDW: 12.5 % (ref 11.5–15.5)
WBC: 13.5 10*3/uL — ABNORMAL HIGH (ref 4.0–10.5)
nRBC: 0 % (ref 0.0–0.2)

## 2021-12-01 LAB — BASIC METABOLIC PANEL
Anion gap: 6 (ref 5–15)
BUN: 17 mg/dL (ref 8–23)
CO2: 25 mmol/L (ref 22–32)
Calcium: 8 mg/dL — ABNORMAL LOW (ref 8.9–10.3)
Chloride: 107 mmol/L (ref 98–111)
Creatinine, Ser: 0.66 mg/dL (ref 0.61–1.24)
GFR, Estimated: >60 mL/min (ref >60)
Glucose, Bld: 189 mg/dL — ABNORMAL HIGH (ref 70–99)
Potassium: 4.7 mmol/L (ref 3.5–5.1)
Sodium: 138 mmol/L (ref 135–145)

## 2021-12-01 LAB — GLUCOSE, CAPILLARY
Glucose-Capillary: 177 mg/dL — ABNORMAL HIGH (ref 70–99)
Glucose-Capillary: 168 mg/dL — ABNORMAL HIGH (ref 70–99)
Glucose-Capillary: 129 mg/dL — ABNORMAL HIGH (ref 70–99)
Glucose-Capillary: 141 mg/dL — ABNORMAL HIGH (ref 70–99)

## 2021-12-01 MED ORDER — ACETAMINOPHEN 10 MG/ML IV SOLN
1000.0000 mg | Freq: Four times a day (QID) | INTRAVENOUS | Status: AC
  Administered 2021-12-01 – 2021-12-02 (×3): 1000 mg via INTRAVENOUS
  Filled 2021-12-01 (×4): qty 100

## 2021-12-01 MED ORDER — SODIUM CHLORIDE 0.9% FLUSH: 10.0000 mL | INTRAVENOUS | Status: DC | PRN

## 2021-12-01 MED ORDER — SODIUM CHLORIDE 0.9% FLUSH
10.0000 mL | Freq: Two times a day (BID) | INTRAVENOUS | Status: DC
  Administered 2021-12-01: 10 mL
  Administered 2021-12-02: 20 mL
  Administered 2021-12-02 – 2021-12-03 (×2): 10 mL
  Administered 2021-12-03: 40 mL
  Administered 2021-12-04 – 2021-12-05 (×2): 10 mL

## 2021-12-01 MED ORDER — ACETAMINOPHEN 10 MG/ML IV SOLN
1000.0000 mg | Freq: Four times a day (QID) | INTRAVENOUS | Status: AC
  Administered 2021-12-02 – 2021-12-03 (×4): 1000 mg via INTRAVENOUS
  Filled 2021-12-01 (×4): qty 100

## 2021-12-01 NOTE — Progress Notes (Signed)
Peripherally Inserted Central Catheter Placement  The IV Nurse has discussed with the patient and/or persons authorized to consent for the patient, the purpose of this procedure and the potential benefits and risks involved with this procedure.  The benefits include less needle sticks, lab draws from the catheter, and the patient may be discharged home with the catheter. Risks include, but not limited to, infection, bleeding, blood clot (thrombus formation), and puncture of an artery; nerve damage and irregular heartbeat and possibility to perform a PICC exchange if needed/ordered by physician.  Alternatives to this procedure were also discussed.  Bard Power PICC patient education guide, fact sheet on infection prevention and patient information card has been provided to patient /or left at bedside.    PICC Placement Documentation  PICC Double Lumen 12/01/21 Right Brachial 41 cm 0 cm (Active)  Indication for Insertion or Continuance of Line Administration of hyperosmolar/irritating solutions (i.e. TPN, Vancomycin, etc.) 12/01/21 1432  Exposed Catheter (cm) 0 cm 12/01/21 1432  Site Assessment Clean, Dry, Intact 12/01/21 1432  Lumen #1 Status Flushed;Saline locked;Blood return noted 12/01/21 1432  Lumen #2 Status Flushed;Saline locked;Blood return noted 12/01/21 1432  Dressing Type Transparent;Securing device 12/01/21 1432  Dressing Status Antimicrobial disc in place 12/01/21 1432  Dressing Intervention New dressing 12/01/21 1432  Dressing Change Due 12/08/21 12/01/21 1432       Maximino Greenland 12/01/2021, 2:33 PM

## 2021-12-01 NOTE — Clinical Social Work Note (Signed)
  Transition of Care (TOC) Screening Note   Patient Details  Name: Randy Harper Date of Birth: Dec 26, 1950   Transition of Care [facility]) CM/SW Contact:    Maree Krabbe, LCSW Phone Number:[phone] 12/01/2021, 3:38 PM    Transition of Care Department [facility]) has reviewed patient and no TOC needs have been identified at this time. We will continue to monitor patient advancement through interdisciplinary progression rounds. If new patient transition needs arise, please place a TOC consult.

## 2021-12-02 ENCOUNTER — Inpatient Hospital Stay: Payer: Medicare Other

## 2021-12-02 DIAGNOSIS — K56609 Unspecified intestinal obstruction, unspecified as to partial versus complete obstruction: Secondary | ICD-10-CM | POA: Diagnosis not present

## 2021-12-02 LAB — GLUCOSE, CAPILLARY
Glucose-Capillary: 101 mg/dL — ABNORMAL HIGH (ref 70–99)
Glucose-Capillary: 106 mg/dL — ABNORMAL HIGH (ref 70–99)
Glucose-Capillary: 119 mg/dL — ABNORMAL HIGH (ref 70–99)
Glucose-Capillary: 130 mg/dL — ABNORMAL HIGH (ref 70–99)
Glucose-Capillary: 133 mg/dL — ABNORMAL HIGH (ref 70–99)
Glucose-Capillary: 150 mg/dL — ABNORMAL HIGH (ref 70–99)

## 2021-12-02 LAB — BASIC METABOLIC PANEL
Anion gap: 3 — ABNORMAL LOW (ref 5–15)
BUN: 18 mg/dL (ref 8–23)
CO2: 28 mmol/L (ref 22–32)
Calcium: 8 mg/dL — ABNORMAL LOW (ref 8.9–10.3)
Chloride: 110 mmol/L (ref 98–111)
Creatinine, Ser: 0.57 mg/dL — ABNORMAL LOW (ref 0.61–1.24)
GFR, Estimated: >60 mL/min (ref >60)
Glucose, Bld: 134 mg/dL — ABNORMAL HIGH (ref 70–99)
Potassium: 3.8 mmol/L (ref 3.5–5.1)
Sodium: 141 mmol/L (ref 135–145)

## 2021-12-02 LAB — PHOSPHORUS: Phosphorus: 2.3 mg/dL — ABNORMAL LOW (ref 2.5–4.6)

## 2021-12-02 LAB — CBC
HCT: 31.5 % — ABNORMAL LOW (ref 39.0–52.0)
Hemoglobin: 10.5 g/dL — ABNORMAL LOW (ref 13.0–17.0)
MCH: 31.3 pg (ref 26.0–34.0)
MCHC: 33.3 g/dL (ref 30.0–36.0)
MCV: 94 fL (ref 80.0–100.0)
Platelets: 136 10*3/uL — ABNORMAL LOW (ref 150–400)
RBC: 3.35 MIL/uL — ABNORMAL LOW (ref 4.22–5.81)
RDW: 13 % (ref 11.5–15.5)
WBC: 8.2 10*3/uL (ref 4.0–10.5)
nRBC: 0 % (ref 0.0–0.2)

## 2021-12-02 LAB — MAGNESIUM: Magnesium: 2.1 mg/dL (ref 1.7–2.4)

## 2021-12-02 MED ORDER — DEXTROSE-NACL 5-0.9 % IV SOLN: INTRAVENOUS | Status: AC

## 2021-12-02 MED ORDER — TRAVASOL 10 % IV SOLN
INTRAVENOUS | Status: AC
  Filled 2021-12-02: qty 403.2

## 2021-12-02 MED ORDER — KCL IN DEXTROSE-NACL 20-5-0.9 MEQ/L-%-% IV SOLN: INTRAVENOUS | Status: DC

## 2021-12-02 MED ORDER — INSULIN ASPART 100 UNIT/ML IJ SOLN
0.0000 [IU] | INTRAMUSCULAR | Status: DC
  Administered 2021-12-02 – 2021-12-04 (×10): 2 [IU] via SUBCUTANEOUS
  Administered 2021-12-05: 3 [IU] via SUBCUTANEOUS
  Administered 2021-12-05 – 2021-12-06 (×5): 2 [IU] via SUBCUTANEOUS
  Filled 2021-12-02 (×17): qty 1

## 2021-12-03 ENCOUNTER — Other Ambulatory Visit: Payer: Self-pay | Admitting: Nurse Practitioner

## 2021-12-03 ENCOUNTER — Inpatient Hospital Stay: Payer: Medicare Other

## 2021-12-03 DIAGNOSIS — I152 Hypertension secondary to endocrine disorders: Secondary | ICD-10-CM

## 2021-12-03 DIAGNOSIS — K56609 Unspecified intestinal obstruction, unspecified as to partial versus complete obstruction: Secondary | ICD-10-CM | POA: Diagnosis not present

## 2021-12-03 LAB — BASIC METABOLIC PANEL
Anion gap: 3 — ABNORMAL LOW (ref 5–15)
BUN: 20 mg/dL (ref 8–23)
CO2: 28 mmol/L (ref 22–32)
Calcium: 8.2 mg/dL — ABNORMAL LOW (ref 8.9–10.3)
Chloride: 111 mmol/L (ref 98–111)
Creatinine, Ser: 0.52 mg/dL — ABNORMAL LOW (ref 0.61–1.24)
GFR, Estimated: >60 mL/min (ref >60)
Glucose, Bld: 127 mg/dL — ABNORMAL HIGH (ref 70–99)
Potassium: 3.6 mmol/L (ref 3.5–5.1)
Sodium: 142 mmol/L (ref 135–145)

## 2021-12-03 LAB — GLUCOSE, CAPILLARY
Glucose-Capillary: 104 mg/dL — ABNORMAL HIGH (ref 70–99)
Glucose-Capillary: 111 mg/dL — ABNORMAL HIGH (ref 70–99)
Glucose-Capillary: 118 mg/dL — ABNORMAL HIGH (ref 70–99)
Glucose-Capillary: 124 mg/dL — ABNORMAL HIGH (ref 70–99)
Glucose-Capillary: 132 mg/dL — ABNORMAL HIGH (ref 70–99)
Glucose-Capillary: 136 mg/dL — ABNORMAL HIGH (ref 70–99)
Glucose-Capillary: 138 mg/dL — ABNORMAL HIGH (ref 70–99)

## 2021-12-03 LAB — TRIGLYCERIDES: Triglycerides: 91 mg/dL (ref <150)

## 2021-12-03 LAB — CBC
HCT: 33.6 % — ABNORMAL LOW (ref 39.0–52.0)
Hemoglobin: 11 g/dL — ABNORMAL LOW (ref 13.0–17.0)
MCH: 30.6 pg (ref 26.0–34.0)
MCHC: 32.7 g/dL (ref 30.0–36.0)
MCV: 93.6 fL (ref 80.0–100.0)
Platelets: 144 10*3/uL — ABNORMAL LOW (ref 150–400)
RBC: 3.59 MIL/uL — ABNORMAL LOW (ref 4.22–5.81)
RDW: 13 % (ref 11.5–15.5)
WBC: 7.1 10*3/uL (ref 4.0–10.5)
nRBC: 0 % (ref 0.0–0.2)

## 2021-12-03 LAB — PHOSPHORUS: Phosphorus: 3.1 mg/dL (ref 2.5–4.6)

## 2021-12-03 LAB — MAGNESIUM: Magnesium: 2.1 mg/dL (ref 1.7–2.4)

## 2021-12-03 MED ORDER — DEXTROSE-NACL 5-0.9 % IV SOLN: INTRAVENOUS | Status: DC

## 2021-12-03 MED ORDER — TRAVASOL 10 % IV SOLN
INTRAVENOUS | Status: AC
  Filled 2021-12-03: qty 806.4

## 2021-12-03 NOTE — Progress Notes (Signed)
PROGRESS NOTE    Randy Harper  WUJ:[id number] DOB: 1950/09/07 DOA: 11/28/2021 PCP: Randy Grooms, NP  Outpatient Specialists: GI, oncology    Brief Narrative:   From admission h and p 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   Assessment & Plan:   Principal Problem:   Small bowel obstruction (HCC) Active Problems:   Hypertension   Hyperlipidemia   BPH (benign prostatic hyperplasia)   Paroxysmal atrial flutter (HCC)   Hypertension associated with diabetes (HCC)   Macular degeneration disease   Diabetes mellitus type 2, noninsulin dependent (HCC)   SBO (small bowel obstruction) (HCC)  # Small bowel obstruction # Post-operative ileus Mechanical obstruction. Failed conservative mgmt. Open laparotomy 6/2 with lysis of adhesions. Now with post-op ileus. Las 24 NG tube output has decreased Remains distended but is passing flatus and bowel sounds improving. Gen surg has clamped NG tube this morning and f/u x-ray is pending. TPN started 6/24 - gen surg folowing, diet per them - pain control - pt/ot say no f/u needed - continue TPN for now  # Anemia # Thrombocytopenia New, mild, post-op. stable - monitor for now  # T2DM Here euglycemic.  - SSI q4 while on tpn  # HTN Normotensive - home dilt on hold, as is atorvastatin  # Hx a flutter Single previous episode, not anticoagulated at home - IV CCB as needed   DVT prophylaxis: lovenox Code Status: full Family Communication: wife updated @ bedside 6/24. No answer when called today; message left.  Level of care: Med-Surg Status is: inpt     Consultants:  Gen  surg  Procedures: NG tube placement, ex lap with lysis of adhesions, picc placement  Antimicrobials:  none    Subjective: Pain controlled. Passing flatus. Ambulating in hallway.   Objective: Vitals:   12/03/21 0406 12/03/21 0419 12/03/21 0738 12/03/21 1120  BP:  135/75 126/74 138/75  Pulse:  88 85 92  Resp:  17 18 18   Temp:  98.2 F (36.8 C) 97.6 F (36.4 C) 98.1 F (36.7 C)  TempSrc:      SpO2:  95% 96% 98%  Weight: 99 kg     Height:        Intake/Output Summary (Last 24 hours) at 12/03/2021 1312 Last data filed at 12/03/2021 0505 Gross per 24 hour  Intake 1175.97 ml  Output 650 ml  Net 525.97 ml   Filed Weights   11/30/21 1320 12/02/21 0500 12/03/21 0406  Weight: 97.1 kg 93.3 kg 99 kg    Examination:  General exam: Appears calm and comfortable  Respiratory system: Clear to auscultation. Respiratory effort normal. Cardiovascular system: S1 & S2 heard, RRR. No JVD, murmurs, rubs, gallops or clicks. No pedal edema. Gastrointestinal system: Abdomen is distended but somewhat improved today, diffuse tender. Incision c/d/I. Decreased bowel sounds inferiorally Central nervous system: Alert and oriented. No focal neurological deficits. Extremities: Symmetric 5 x 5 power. Skin: No rashes, lesions or ulcers Psychiatry: Judgement and insight appear normal. Mood & affect appropriate.     Data Reviewed: I have personally reviewed following labs and imaging studies  CBC: Recent Labs  Lab 11/29/21 0319 11/30/21 0401 12/01/21 0411 12/02/21 0541 12/03/21 0500  WBC 8.8 8.8 13.5* 8.2 7.1  HGB 13.5 13.4 13.8 10.5* 11.0*  HCT 39.5 39.7 40.4 31.5* 33.6*  MCV 91.9 91.1 90.6 94.0 93.6  PLT 181 179 172 136* 144*   Basic Metabolic Panel: Recent Labs  Lab 11/29/21 0319 11/30/21 0401 12/01/21 0411 12/02/21 0832 12/03/21 0500  NA 134* 136 138 141 142  K 4.2 3.6 4.7 3.8 3.6  CL 101 104 107 110 111  CO2 26 24 25 28 28   GLUCOSE 116* 78 189* 134* 127*  BUN 16 15 17 18  20   CREATININE 0.72 0.56* 0.66 0.57* 0.52*  CALCIUM 9.0 8.7* 8.0* 8.0* 8.2*  MG 1.9  --   --  2.1 2.1  PHOS  --   --   --  2.3* 3.1   GFR: Estimated Creatinine Clearance: 93.8 mL/min (A) (by C-G formula based on SCr of 0.52 mg/dL (L)). Liver Function Tests: Recent Labs  Lab 11/29/21 0319  AST 18  ALT 17  ALKPHOS 74  BILITOT 1.0  PROT 6.6  ALBUMIN 3.8   No results for input(s): "LIPASE", "AMYLASE" in the last 168 hours. No results for input(s): "AMMONIA" in the last 168 hours. Coagulation Profile: No results for input(s): "INR", "PROTIME" in the last 168 hours. Cardiac Enzymes: No results for input(s): "CKTOTAL", "CKMB", "CKMBINDEX", "TROPONINI" in the last 168 hours. BNP (last 3 results) No results for input(s): "PROBNP" in the last 8760 hours. HbA1C: No results for input(s): "HGBA1C" in the last 72 hours. CBG: Recent Labs  Lab 12/02/21 2207 12/02/21 2332 12/03/21 0339 12/03/21 0737 12/03/21 1121  GLUCAP 130* 119* 136* 124* 104*   Lipid Profile: Recent Labs    12/03/21 0500  TRIG 91   Thyroid Function Tests: No results for input(s): "TSH", "T4TOTAL", "FREET4", "T3FREE", "THYROIDAB" in the last 72 hours. Anemia Panel: No results for input(s): "VITAMINB12", "FOLATE", "FERRITIN", "TIBC", "IRON", "RETICCTPCT" in the last 72 hours. Urine analysis:    Component Value Date/Time   APPEARANCEUR Clear 08/09/2021 0820   GLUCOSEU Negative 08/09/2021 0820   BILIRUBINUR Negative 08/09/2021 0820   PROTEINUR Negative 08/09/2021 0820   NITRITE Negative 08/09/2021 0820   LEUKOCYTESUR Negative 08/09/2021 0820   Sepsis Labs: @LABRCNTIP (procalcitonin:4,lacticidven:4)  )No results found for this or any previous visit (from the past 240 hour(s)).       Radiology Studies: DG Abd 2 Views  Result Date: 12/02/2021 CLINICAL DATA:  Small bowel obstruction EXAM: ABDOMEN - 2 VIEW COMPARISON:  November 30, 2021 FINDINGS: Lung bases are unchanged unremarkable. An NG tube terminates  in the region of the distal stomach. Skin staples identified at midline. Continued small bowel dilatation with air-fluid levels. Relative decompression of the colon relative to the small bowel. No other interval changes. IMPRESSION: Continued small bowel obstruction.  No other changes. Electronically Signed   By: Gerome [name] III M.D.   On: 12/02/2021 08:39        Scheduled Meds:  Chlorhexidine Gluconate Cloth  6 each Topical Daily   enoxaparin (LOVENOX) injection  0.5 mg/kg Subcutaneous Q24H   insulin aspart  0-15 Units Subcutaneous Q4H   ketorolac  15 mg Intravenous Q6H   sodium chloride flush  10-40 mL Intracatheter Q12H   Continuous Infusions:  acetaminophen Stopped (12/03/21 1223)   dextrose 5 % and 0.9% NaCl 60 mL/hr at 12/02/21 1736   dextrose 5 % and 0.9% NaCl     TPN ADULT (ION) 30 mL/hr at 12/02/21 1841   TPN ADULT (ION)       LOS: 4 days  Silvano Bilis, MD [facility]   If 7PM-7AM, please contact night-coverage [email or web address] Password [email or web address] 12/03/2021, 1:12 PM

## 2021-12-04 ENCOUNTER — Inpatient Hospital Stay: Payer: Medicare Other

## 2021-12-04 DIAGNOSIS — K56609 Unspecified intestinal obstruction, unspecified as to partial versus complete obstruction: Secondary | ICD-10-CM | POA: Diagnosis not present

## 2021-12-04 LAB — GLUCOSE, CAPILLARY
Glucose-Capillary: 125 mg/dL — ABNORMAL HIGH (ref 70–99)
Glucose-Capillary: 136 mg/dL — ABNORMAL HIGH (ref 70–99)
Glucose-Capillary: 144 mg/dL — ABNORMAL HIGH (ref 70–99)
Glucose-Capillary: 124 mg/dL — ABNORMAL HIGH (ref 70–99)
Glucose-Capillary: 136 mg/dL — ABNORMAL HIGH (ref 70–99)

## 2021-12-04 LAB — COMPREHENSIVE METABOLIC PANEL
ALT: 27 U/L (ref 0–44)
AST: 33 U/L (ref 15–41)
Albumin: 2.8 g/dL — ABNORMAL LOW (ref 3.5–5.0)
Alkaline Phosphatase: 60 U/L (ref 38–126)
Anion gap: 3 — ABNORMAL LOW (ref 5–15)
BUN: 20 mg/dL (ref 8–23)
CO2: 27 mmol/L (ref 22–32)
Calcium: 8.1 mg/dL — ABNORMAL LOW (ref 8.9–10.3)
Chloride: 112 mmol/L — ABNORMAL HIGH (ref 98–111)
Creatinine, Ser: 0.5 mg/dL — ABNORMAL LOW (ref 0.61–1.24)
GFR, Estimated: >60 mL/min (ref >60)
Glucose, Bld: 136 mg/dL — ABNORMAL HIGH (ref 70–99)
Potassium: 3.8 mmol/L (ref 3.5–5.1)
Sodium: 142 mmol/L (ref 135–145)
Total Bilirubin: 0.4 mg/dL (ref 0.3–1.2)
Total Protein: 5 g/dL — ABNORMAL LOW (ref 6.5–8.1)

## 2021-12-04 LAB — CBC
HCT: 34 % — ABNORMAL LOW (ref 39.0–52.0)
Hemoglobin: 11 g/dL — ABNORMAL LOW (ref 13.0–17.0)
MCH: 30.6 pg (ref 26.0–34.0)
MCHC: 32.4 g/dL (ref 30.0–36.0)
MCV: 94.7 fL (ref 80.0–100.0)
Platelets: 146 10*3/uL — ABNORMAL LOW (ref 150–400)
RBC: 3.59 MIL/uL — ABNORMAL LOW (ref 4.22–5.81)
RDW: 13 % (ref 11.5–15.5)
WBC: 5.3 10*3/uL (ref 4.0–10.5)
nRBC: 0 % (ref 0.0–0.2)

## 2021-12-04 LAB — PHOSPHORUS: Phosphorus: 3.7 mg/dL (ref 2.5–4.6)

## 2021-12-04 LAB — MAGNESIUM: Magnesium: 1.9 mg/dL (ref 1.7–2.4)

## 2021-12-04 LAB — TRIGLYCERIDES: Triglycerides: 115 mg/dL (ref <150)

## 2021-12-04 MED ORDER — DEXTROSE-NACL 5-0.9 % IV SOLN
INTRAVENOUS | Status: AC
Start: 2021-12-04 — End: 2021-12-04

## 2021-12-04 MED ORDER — TRAVASOL 10 % IV SOLN
INTRAVENOUS | Status: AC
  Filled 2021-12-04: qty 1209.6

## 2021-12-04 NOTE — Progress Notes (Addendum)
1008 NG tube removed at this time. Cath tip intact. Pt tolerated  1205 Pt tolerated clear liquid diet without complaints of n/v. No abd pain. Nurse will cont to monitor

## 2021-12-04 NOTE — Telephone Encounter (Signed)
Requested medications are due for refill today.  Too soon  Requested medications are on the active medications list.  yes  Last refill. 08/09/2021 #90 1 refill  Future visit scheduled.   yes  Notes to clinic.  Pharmacy is requesting a 1 year supply.    Requested Prescriptions  Pending Prescriptions Disp Refills   diltiazem (CARDIZEM CD) 240 MG 24 hr capsule [Pharmacy Med Name: DILTIAZEM  240MG   CAP  24 HR CD] 80 capsule 3    Sig: TAKE 1 CAPSULE BY MOUTH DAILY     Cardiovascular: Calcium Channel Blockers 3 Failed - 12/03/2021 10:50 PM      Failed - Cr in normal range and within 360 days    Creatinine, Ser  Date Value Ref Range Status  12/04/2021 0.50 (L) 0.61 - 1.24 mg/dL Final         Failed - Last BP in normal range    BP Readings from Last 1 Encounters:  12/04/21 124/60         Passed - ALT in normal range and within 360 days    ALT  Date Value Ref Range Status  12/04/2021 27 0 - 44 U/L Final   ALT (SGPT) Piccolo, Waived  Date Value Ref Range Status  01/14/2019 26 10 - 47 U/L Final         Passed - AST in normal range and within 360 days    AST  Date Value Ref Range Status  12/04/2021 33 15 - 41 U/L Final   AST (SGOT) Piccolo, Waived  Date Value Ref Range Status  01/14/2019 32 11 - 38 U/L Final         Passed - Last Heart Rate in normal range    Pulse Readings from Last 1 Encounters:  12/04/21 66         Passed - Valid encounter within last 6 months    Recent Outpatient Visits           3 months ago Annual physical exam   [facility] Larae Grooms, NP   9 months ago Right calf pain   [facility] McElwee, Lauren A, NP   10 months ago Diabetes mellitus associated with hormonal etiology (HCC)   [facility] Larae Grooms, NP   1 year ago Annual physical exam   [facility] Larae Grooms, NP   1 year ago Hypertension associated with diabetes [facility])   [facility] Particia Nearing, [place]       Future Appointments             In 1 month  [facility], [facility]   In 2 months Larae Grooms, NP [facility], [facility]

## 2021-12-04 NOTE — Progress Notes (Signed)
[facility] SURGICAL PROGRESS NOTE  Hospital Day(s): 5.   Post op day(s): 4 Days Post-Op.   Interval History:  Patient seen and examined No acute events or new complaints overnight.  Patient reports he feels better No abdominal pain but remains distended No fever, chills, nausea, emesis He remains without leukocytosis; 5.3K Renal function remains normal; sCr - 0.50; UO - unmeasured No electrolyte derangements  NGT clamped; checked residuals at bedside which were <20 ccs He has had BM and passed flatus   Vital signs in last 24 hours: [min-max] current  Temp:  [97.6 F (36.4 C)-98.6 F (37 C)] 98.5 F (36.9 C) (06/26 0433) Pulse Rate:  [85-96] 92 (06/26 0433) Resp:  [16-18] 16 (06/26 0433) BP: (126-150)/(64-75) 139/65 (06/26 0433) SpO2:  [93 %-98 %] 93 % (06/26 0433) Weight:  [98.8 kg] 98.8 kg (06/26 0440)     Height: 5\' 8"  (172.7 cm) Weight: 98.8 kg BMI (Calculated): 33.13   Intake/Output last 2 shifts:  06/25 0701 - 06/26 0700 In: -  Out: 1 [Stool:1]   Physical Exam:  Constitutional: alert, cooperative and no distress  HEENT: NGT in place; clamped; residuals <20 ccs Respiratory: breathing non-labored at rest  Cardiovascular: regular rate and sinus rhythm  Gastrointestinal: Soft, incisional soreness, he still appears distended, no rebound/guarding Integumentary: Laparotomy is CDI with staples, some drainage on inferior portion of honey comb, no gross erythema   Labs:     Latest Ref Rng & Units 12/04/2021    4:38 AM 12/03/2021    5:00 AM 12/02/2021    5:41 AM  CBC  WBC 4.0 - 10.5 K/uL 5.3  7.1  8.2   Hemoglobin 13.0 - 17.0 g/dL 16.1  09.6  04.5   Hematocrit 39.0 - 52.0 % 34.0  33.6  31.5   Platelets 150 - 400 K/uL 146  144  136       Latest Ref Rng & Units 12/04/2021    4:38 AM 12/03/2021    5:00 AM 12/02/2021    8:32 AM  CMP  Glucose 70 - 99 mg/dL 409  811  914   BUN 8 - 23 mg/dL 20  20  18    Creatinine 0.61 - 1.24 mg/dL 7.82  9.56  2.13    Sodium 135 - 145 mmol/L 142  142  141   Potassium 3.5 - 5.1 mmol/L 3.8  3.6  3.8   Chloride 98 - 111 mmol/L 112  111  110   CO2 22 - 32 mmol/L 27  28  28    Calcium 8.9 - 10.3 mg/dL 8.1  8.2  8.0   Total Protein 6.5 - 8.1 g/dL 5.0     Total Bilirubin 0.3 - 1.2 mg/dL 0.4     Alkaline Phos 38 - 126 U/L 60     AST 15 - 41 U/L 33     ALT 0 - 44 U/L 27       Imaging studies: No new pertinent imaging studies   Assessment/Plan:  71 y.o. male with ROBF but continues to be distended 4 Days Post-Op s/p exploratory laparotomy, lysis of adhesions, and ventral hernia repair for SBO.   - Will get KUB this morning as precaution to reassess abdominal distension. He is otherwise clinically resolving.            - Continue NGT clamped for now; this has been clamped x24 hours. I checked residuals this morning which were <20 ccs. If KUB is improved some, we can remove NGT   -  Continue NPO for now; potentially start CLD this morning   - Continue TPN at goal           - Monitor abdominal examination; on-going bowel function           - Pain control prn; antiemetics prn            - Mobilization as tolerated; therapies on board; no needs           - Further management per primary service; we will follow    All of the above findings and recommendations were discussed with the patient, patient's family (wife at bedside), and the medical team, and all of their questions were answered to their expressed satisfaction.  -- Lynden Oxford, PA-C [facility] 12/04/2021, 7:18 AM M-F: 7am - 4pm

## 2021-12-05 DIAGNOSIS — K56609 Unspecified intestinal obstruction, unspecified as to partial versus complete obstruction: Secondary | ICD-10-CM | POA: Diagnosis not present

## 2021-12-05 LAB — GLUCOSE, CAPILLARY
Glucose-Capillary: 111 mg/dL — ABNORMAL HIGH (ref 70–99)
Glucose-Capillary: 138 mg/dL — ABNORMAL HIGH (ref 70–99)
Glucose-Capillary: 144 mg/dL — ABNORMAL HIGH (ref 70–99)
Glucose-Capillary: 147 mg/dL — ABNORMAL HIGH (ref 70–99)
Glucose-Capillary: 148 mg/dL — ABNORMAL HIGH (ref 70–99)
Glucose-Capillary: 155 mg/dL — ABNORMAL HIGH (ref 70–99)

## 2021-12-05 MED ORDER — TRAVASOL 10 % IV SOLN
INTRAVENOUS | Status: DC
  Filled 2021-12-05: qty 1209.6

## 2021-12-05 MED ORDER — ENSURE ENLIVE PO LIQD
237.0000 mL | Freq: Two times a day (BID) | ORAL | Status: DC
  Administered 2021-12-06: 237 mL via ORAL

## 2021-12-05 MED ORDER — OXYCODONE HCL 5 MG PO TABS
5.0000 mg | ORAL_TABLET | Freq: Four times a day (QID) | ORAL | Status: DC | PRN
  Administered 2021-12-05 (×2): 5 mg via ORAL
  Filled 2021-12-05 (×2): qty 1

## 2021-12-05 MED ORDER — TRAVASOL 10 % IV SOLN
INTRAVENOUS | Status: DC
  Filled 2021-12-05: qty 604.8

## 2021-12-05 NOTE — Progress Notes (Signed)
PROGRESS NOTE    [name]  XBM:[id number] DOB: 01-04-1951 DOA: 11/28/2021 PCP: Larae Grooms, NP  Outpatient Specialists: GI, oncology    Brief Narrative:   From admission h and p 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   Assessment & Plan:   Principal Problem:   Small bowel obstruction (HCC) Active Problems:   Hypertension   Hyperlipidemia   BPH (benign prostatic hyperplasia)   Paroxysmal atrial flutter (HCC)   Hypertension associated with diabetes (HCC)   Macular degeneration disease   Diabetes mellitus type 2, noninsulin dependent (HCC)   SBO (small bowel obstruction) (HCC)  # Small bowel obstruction # Post-operative ileus Mechanical obstruction. Failed conservative mgmt. Open laparotomy 6/2 with lysis of adhesions. Now with post-op ileus that is resolving. NG tube is out. Having BMs and passing flatus.  TPN started 6/24 - gen surg folowing, diet per them, they have advanced to full liquids - pain controlled - pt/ot say no f/u needed - continue TPN for now, at reduced rate today, possible d/c tomorrow  # Anemia # Thrombocytopenia New, mild, post-op. stable  # T2DM Here euglycemic.  - SSI q4 while on tpn  # HTN Normotensive - home dilt on hold, as is atorvastatin  # Hx a flutter Single previous episode, not anticoagulated at home - IV CCB as needed   DVT prophylaxis: lovenox Code Status: full Family Communication: wife updated @ bedside 6/26. Called and left message today.  Level of care: Med-Surg Status is: inpt     Consultants:  Gen surg  Procedures: NG tube placement, ex lap with lysis of  adhesions, picc placement  Antimicrobials:  none    Subjective: Pain controlled. Passing flatus, several loose BMs overnight  Objective: Vitals:   12/05/21 0343 12/05/21 0704 12/05/21 0735 12/05/21 1055  BP: (!) 154/74 (!) 157/84 (!) 148/74 (!) 150/80  Pulse: 86 89 90 93  Resp: 16 16 15 17   Temp: 99 F (37.2 C) 98.1 F (36.7 C) 98.2 F (36.8 C) 98 F (36.7 C)  TempSrc:   Oral Oral  SpO2: 94% 95% 96% 97%  Weight:      Height:        Intake/Output Summary (Last 24 hours) at 12/05/2021 1423 Last data filed at 12/05/2021 1338 Gross per 24 hour  Intake 3317.55 ml  Output 4 ml  Net 3313.55 ml   Filed Weights   12/02/21 0500 12/03/21 0406 12/04/21 0440  Weight: 93.3 kg 99 kg 98.8 kg    Examination:  General exam: Appears calm and comfortable  Respiratory system: Clear to auscultation. Respiratory effort normal. Cardiovascular system: S1 & S2 heard, RRR. No JVD, murmurs, rubs, gallops or clicks. No pedal edema. Gastrointestinal system: Abdomen is distended but somewhat improved today, diffuse tenderness mild. Incision c/d/I. Decreased bowel sounds inferiorally Central nervous system: Alert and oriented. No focal neurological deficits. Extremities: Symmetric 5 x 5 power. Skin: No rashes, lesions or ulcers Psychiatry: Judgement and insight appear normal. Mood & affect appropriate.     Data Reviewed: I have personally reviewed following labs and imaging studies  CBC: Recent Labs  Lab 11/30/21 0401 12/01/21 0411 12/02/21 0541 12/03/21 0500 12/04/21 0438  WBC 8.8 13.5* 8.2 7.1 5.3  HGB 13.4 13.8  10.5* 11.0* 11.0*  HCT 39.7 40.4 31.5* 33.6* 34.0*  MCV 91.1 90.6 94.0 93.6 94.7  PLT 179 172 136* 144* 146*   Basic Metabolic Panel: Recent Labs  Lab 11/29/21 0319 11/30/21 0401 12/01/21 0411 12/02/21 0832 12/03/21 0500 12/04/21 0438  NA 134* 136 138 141 142 142  K 4.2 3.6 4.7 3.8 3.6 3.8  CL 101 104 107 110 111 112*  CO2 26 24 25 28 28 27   GLUCOSE 116* 78  189* 134* 127* 136*  BUN 16 15 17 18 20 20   CREATININE 0.72 0.56* 0.66 0.57* 0.52* 0.50*  CALCIUM 9.0 8.7* 8.0* 8.0* 8.2* 8.1*  MG 1.9  --   --  2.1 2.1 1.9  PHOS  --   --   --  2.3* 3.1 3.7   GFR: Estimated Creatinine Clearance: 93.8 mL/min (A) (by C-G formula based on SCr of 0.5 mg/dL (L)). Liver Function Tests: Recent Labs  Lab 11/29/21 0319 12/04/21 0438  AST 18 33  ALT 17 27  ALKPHOS 74 60  BILITOT 1.0 0.4  PROT 6.6 5.0*  ALBUMIN 3.8 2.8*   No results for input(s): "LIPASE", "AMYLASE" in the last 168 hours. No results for input(s): "AMMONIA" in the last 168 hours. Coagulation Profile: No results for input(s): "INR", "PROTIME" in the last 168 hours. Cardiac Enzymes: No results for input(s): "CKTOTAL", "CKMB", "CKMBINDEX", "TROPONINI" in the last 168 hours. BNP (last 3 results) No results for input(s): "PROBNP" in the last 8760 hours. HbA1C: No results for input(s): "HGBA1C" in the last 72 hours. CBG: Recent Labs  Lab 12/04/21 2119 12/05/21 0004 12/05/21 0341 12/05/21 0754 12/05/21 1202  GLUCAP 136* 155* 144* 147* 148*   Lipid Profile: Recent Labs    12/03/21 0500 12/04/21 0438  TRIG 91 115   Thyroid Function Tests: No results for input(s): "TSH", "T4TOTAL", "FREET4", "T3FREE", "THYROIDAB" in the last 72 hours. Anemia Panel: No results for input(s): "VITAMINB12", "FOLATE", "FERRITIN", "TIBC", "IRON", "RETICCTPCT" in the last 72 hours. Urine analysis:    Component Value Date/Time   APPEARANCEUR Clear 08/09/2021 0820   GLUCOSEU Negative 08/09/2021 0820   BILIRUBINUR Negative 08/09/2021 0820   PROTEINUR Negative 08/09/2021 0820   NITRITE Negative 08/09/2021 0820   LEUKOCYTESUR Negative 08/09/2021 0820   Sepsis Labs: @LABRCNTIP (procalcitonin:4,lacticidven:4)  )No results found for this or any previous visit (from the past 240 hour(s)).       Radiology Studies: DG ABD ACUTE 2+V W 1V CHEST  Result Date: 12/04/2021 CLINICAL DATA:  Ileus. Recent  laparotomy and lysis of adhesions for small-bowel obstruction. EXAM: DG ABDOMEN ACUTE WITH 1 VIEW CHEST COMPARISON:  Abdominal x-ray from yesterday. Chest x-ray dated November 30, 2021. FINDINGS: Unchanged enteric tube appropriately positioned in the stomach. Unchanged diffusely dilated small bowel measuring up to 6.5 cm in diameter, previously 6.4 cm. Air, stool, and oral contrast noted in the colon. No free intraperitoneal air. No radiopaque calculi or other significant radiographic abnormality is seen. New right upper extremity PICC line with tip at the cavoatrial junction. Heart size and mediastinal contours are within normal limits. Both lungs are clear. IMPRESSION: 1. Unchanged small bowel ileus. 2. No acute cardiopulmonary disease. Electronically Signed   By: Obie Dredge M.D.   On: 12/04/2021 09:53        Scheduled Meds:  Chlorhexidine Gluconate Cloth  6 each Topical Daily   enoxaparin (LOVENOX) injection  0.5 mg/kg Subcutaneous Q24H   insulin aspart  0-15 Units Subcutaneous Q4H   sodium chloride flush  10-40 mL  Intracatheter Q12H   Continuous Infusions:  TPN ADULT (ION) 90 mL/hr at 12/05/21 1338   TPN ADULT (ION)       LOS: 6 days     Silvano Bilis, MD [facility]   If 7PM-7AM, please contact night-coverage [email or web address] Password [facility] 12/05/2021, 2:23 PM

## 2021-12-05 NOTE — Progress Notes (Addendum)
Nutrition Follow-up  DOCUMENTATION CODES:   Not applicable  INTERVENTION:   -TPN management per pharmacy -Ensure Enlive po BID, each supplement provides 350 kcal and 20 grams of protein   NUTRITION DIAGNOSIS:   Increased nutrient needs related to post-op healing as evidenced by estimated needs.  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  6/22- s/p ex lap with LOA and ventral hernia repair; NGT placed for decompression 6/24- TPN initiated 6/26- NGT d/c, advanced to clear liquid diet 6/27- advanced to full liquid diet  Reviewed I/O's: +3.3 L x 24 hours and +4.1 L since admission  UUP: 4 ml x 24 hours  Pt unavailable at time of visit. Attempted to speak with pt via call to hospital room phone, however, unable to reach.   Pt just advanced to full liquid diet. He is tolerating well. Noted meal completions 50-100%.   Pt remains on TPN. He is receiving TPN at goal rate of 90 ml/hr, which provides 2160 kcals and 121 grams protein, meeting 100% of needs. Plan to wean TPN- reduced rate of 45 ml/hr will start at 1800 (provides 1080 kcals and 60 grams protein, meeting 51% of estimated kcal needs and 50% of estimated protein needs). Per general surgery notes, anticipate discharge home within 24-48 hours.   Labs reviewed: CBGS: 124-148 (inpatient orders for glycemic control are 0-15 units insulin aspart every 4 hours).    Diet Order:   Diet Order             Diet full liquid Room service appropriate? Yes; Fluid consistency: Thin  Diet effective  now                   EDUCATION NEEDS:   Education needs have been addressed  Skin:  Skin Assessment: Skin Integrity Issues: Skin Integrity Issues:: Incisions Incisions: closed abdomen  Last BM:  11/25/21  Height:   Ht Readings from Last 1 Encounters:  11/30/21 5\' 8"  (1.727 m)    Weight:   Wt Readings from Last 1 Encounters:  12/04/21 98.8 kg    Ideal Body Weight:  70 kg  BMI:  Body mass index is 33.12 kg/m.  Estimated Nutritional Needs:   Kcal:  2100-2300  Protein:  120-140 grams  Fluid:  > 2 L    Levada Schilling, RD, LDN, CDCES Registered Dietitian II Certified Diabetes Care and Education Specialist Please refer to [facility] for RD and/or RD on-call/weekend/after hours pager

## 2021-12-05 NOTE — Consult Note (Addendum)
PHARMACY - TOTAL PARENTERAL NUTRITION CONSULT NOTE   Indication: bowel obstruction  Patient Measurements: Height: '5\' 8"'$  (172.7 cm) Weight: 98.8 kg (217 lb 13 oz) IBW/kg (Calculated) : 68.4 TPN AdjBW (KG): 75.6 Body mass index is 33.12 kg/m. 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 Last meal was 6/17. S/p exploratory laparotomy.   Glucose / Insulin: BG 125-136. SSI: 13 units in the last 24 hours.  Electrolytes: K+ 3.8, phos 3.7, Mg 1.9  Renal: WNL Hepatic: WNL Intake / Output; MIVF: +4.1 L;  GI Imaging:Lung bases are unchanged unremarkable. An NG tube terminates in the region of the distal stomach. Skin staples identified at midline.Continued small bowel dilatation with air-fluid levels. Relative decompression of the colon relative to the small bowel. GI Surgeries / Procedures: 6/22 exploratory laparotomy   Central access: 6/23 TPN start date: 6/24  Nutritional Goals: Goal TPN rate is 90 mL/hr (provides 120 g of protein and 2160 kcals per day)  RD Assessment: Estimated Needs Total Energy Estimated Needs: 2100-2300 Total Protein Estimated Needs: 120-140 grams Total Fluid Estimated Needs: > 2 L  Current Nutrition:  TPN Full Liquid Diet  Plan:  Decrease TPN to half rate 45 mL/hr at 1800 - protein 60.48 - dextrose 151.2 g  - lipids 32.4 g - Kcal 1080 Electrolytes in TPN: Na 14mq/L, K 574m/L, Ca 36m21mL, Mg 36mE42m, and Phos 136mm17m. Cl:Ac 1:1 Add standard MVI and trace elements to TPN Continue Moderate q6h SSI and adjust as needed  Monitor TPN labs on Mon/Thurs  [place] 12/05/2021 8:07 AM

## 2021-12-06 DIAGNOSIS — K56609 Unspecified intestinal obstruction, unspecified as to partial versus complete obstruction: Secondary | ICD-10-CM | POA: Diagnosis not present

## 2021-12-06 LAB — GLUCOSE, CAPILLARY
Glucose-Capillary: 141 mg/dL — ABNORMAL HIGH (ref 70–99)
Glucose-Capillary: 127 mg/dL — ABNORMAL HIGH (ref 70–99)
Glucose-Capillary: 118 mg/dL — ABNORMAL HIGH (ref 70–99)
Glucose-Capillary: 160 mg/dL — ABNORMAL HIGH (ref 70–99)

## 2021-12-06 MED ORDER — ADULT MULTIVITAMIN W/MINERALS CH
1.0000 | ORAL_TABLET | Freq: Every day | ORAL | Status: DC
Start: 2021-12-06 — End: 2021-12-06

## 2021-12-06 MED ORDER — ATORVASTATIN CALCIUM 80 MG PO TABS
80.0000 mg | ORAL_TABLET | Freq: Every day | ORAL | Status: DC
  Filled 2021-12-06: qty 1

## 2021-12-06 MED ORDER — PANTOPRAZOLE SODIUM 40 MG PO TBEC
40.0000 mg | DELAYED_RELEASE_TABLET | Freq: Every day | ORAL | Status: DC
Start: 2021-12-06 — End: 2021-12-06

## 2021-12-06 MED ORDER — OXYCODONE HCL 5 MG PO TABS: 5.0000 mg | ORAL_TABLET | Freq: Three times a day (TID) | ORAL | 0 refills | Status: DC | PRN

## 2021-12-06 MED ORDER — METFORMIN HCL 500 MG PO TABS
500.0000 mg | ORAL_TABLET | Freq: Every day | ORAL | Status: DC
Start: 2021-12-07 — End: 2021-12-06

## 2021-12-06 MED ORDER — DILTIAZEM HCL ER COATED BEADS 240 MG PO CP24: 240.0000 mg | ORAL_CAPSULE | Freq: Every day | ORAL | Status: DC

## 2021-12-06 MED ORDER — ENSURE ENLIVE PO LIQD: 237.0000 mL | Freq: Two times a day (BID) | ORAL | 12 refills | Status: DC

## 2021-12-06 MED ORDER — OCUVITE-LUTEIN PO CAPS
1.0000 | ORAL_CAPSULE | Freq: Every day | ORAL | Status: DC
  Filled 2021-12-06: qty 1

## 2021-12-06 MED ORDER — BRIMONIDINE TARTRATE 0.2 % OP SOLN
1.0000 [drp] | Freq: Every day | OPHTHALMIC | Status: DC
  Filled 2021-12-06: qty 5

## 2021-12-06 NOTE — Consult Note (Signed)
PHARMACY - TOTAL PARENTERAL NUTRITION CONSULT NOTE   Indication: bowel obstruction  Patient Measurements: Height: '5\' 8"'$  (172.7 cm) Weight: 98.8 kg (217 lb 13 oz) IBW/kg (Calculated) : 68.4 TPN AdjBW (KG): 75.6 Body mass index is 33.12 kg/m. 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 Last meal was 6/17. S/p exploratory laparotomy.   Glucose / Insulin: BG 127-144. SSI: 8 units in the last 24 hours.  Electrolytes: K+ 3.8, phos 3.7, Mg 1.9  Renal: WNL Hepatic: WNL Intake / Output; MIVF: +4.9 L;  GI Imaging:Lung bases are unchanged unremarkable. An NG tube terminates in the region of the distal stomach. Skin staples identified at midline.Continued small bowel dilatation with air-fluid levels. Relative decompression of the colon relative to the small bowel. GI Surgeries / Procedures: 6/22 exploratory laparotomy   Central access: 6/23 TPN start date: 6/24  Nutritional Goals: Goal TPN rate is 90 mL/hr (provides 120 g of protein and 2160 kcals per day)  RD Assessment: Estimated Needs Total Energy Estimated Needs: 2100-2300 Total Protein Estimated Needs: 120-140 grams Total Fluid Estimated Needs: > 2 L  Current Nutrition:  TPN Soft diet  Plan:  Will stop TPN after today's bag runs out per instruction from surgical team  [place] Pharmacist 12/06/2021 7:40 AM

## 2021-12-06 NOTE — Progress Notes (Signed)
[facility] Hospital Day(s): 7.   Post op day(s): 6 Days Post-Op.   Interval History:  Patient seen and examined No acute events or new complaints overnight.  Patient reports some incisional pain; improving with medications  No fever, chills, nausea, emesis No new labs this morning He is on FLD; tolerating He has had BM and passed flatus   Vital signs in last 24 hours: [min-max] current  Temp:  [97.6 F (36.4 C)-98.2 F (36.8 C)] 97.9 F (36.6 C) (06/28 0431) Pulse Rate:  [90-100] 100 (06/28 0431) Resp:  [15-18] 17 (06/28 0431) BP: (145-162)/(73-81) 162/73 (06/28 0431) SpO2:  [94 %-97 %] 95 % (06/28 0431)     Height: '5\' 8"'$  (172.7 cm) Weight: 98.8 kg BMI (Calculated): 33.13   Intake/Output last 2 shifts:  06/27 0701 - 06/28 0700 In: 813.2 [I.V.:813.2] Out: -    Physical Exam:  Constitutional: alert, cooperative and no distress  Respiratory: breathing non-labored at rest  Cardiovascular: regular rate and sinus rhythm  Gastrointestinal: Soft, incisional soreness, he still appears distended but improving and he reports this is close to his baseline now, no rebound/guarding Integumentary: Laparotomy is CDI with staples, some ecchymosis, no gross erythema   Labs:     Latest Ref Rng & Units 12/04/2021    4:38 AM 12/03/2021    5:00 AM 12/02/2021    5:41 AM  CBC  WBC 4.0 - 10.5 K/uL 5.3  7.1  8.2   Hemoglobin 13.0 - 17.0 g/dL 11.0  11.0  10.5   Hematocrit 39.0 - 52.0 % 34.0  33.6  31.5   Platelets 150 - 400 K/uL 146  144  136       Latest Ref Rng & Units 12/04/2021    4:38 AM 12/03/2021    5:00 AM 12/02/2021    8:32 AM  CMP  Glucose 70 - 99 mg/dL 136  127  134   BUN 8 - 23 mg/dL '20  20  18   '$ Creatinine 0.61 - 1.24 mg/dL 0.50  0.52  0.57   Sodium 135 - 145 mmol/L 142  142  141   Potassium 3.5 - 5.1 mmol/L 3.8  3.6  3.8   Chloride 98 - 111 mmol/L 112  111  110   CO2 22 - 32 mmol/L '27  28  28   '$ Calcium 8.9 - 10.3 mg/dL 8.1  8.2   8.0   Total Protein 6.5 - 8.1 g/dL 5.0     Total Bilirubin 0.3 - 1.2 mg/dL 0.4     Alkaline Phos 38 - 126 U/L 60     AST 15 - 41 U/L 33     ALT 0 - 44 U/L 27       Imaging studies: No new pertinent imaging studies   Assessment/Plan:  71 y.o. male with ROBF 6 Days Post-Op s/p exploratory laparotomy, lysis of adhesions, and ventral hernia repair for SBO.   - Advance to soft diet   - Discontinue TPN           - Monitor abdominal examination; on-going bowel function           - Pain control prn; antiemetics prn            - Mobilization as tolerated; therapies on board; no needs           - Further management per primary service; we will follow     - Discharge Planning: If tolerates diet this morning,  okay for discharge this afternoon. Follow up on 07/11 for staple removal. I have updated his DC instructions as well.   All of the above findings and recommendations were discussed with the patient, patient's family (wife at bedside), and the medical team, and all of their questions were answered to their expressed satisfaction.  -- Edison Simon, PA-C [facility] 12/06/2021, 7:05 AM M-F: 7am - 4pm

## 2021-12-06 NOTE — Care Management Important Message (Signed)
Important Message  Patient Details  Name: Randy Harper MRN: [id number] Date of Birth: 1950-09-16   Medicare Important Message Given:  Yes     Juliann Pulse A Elzada [place]el 12/06/2021, 1:00 PM

## 2021-12-06 NOTE — Discharge Summary (Signed)
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  Small bowel obstruction-mechanical status post exploratory laparotomy with lysis of lesions postoperative ileus -- patient we much better. Was on TPN not discontinued since tolerating soft diet. -- Had bowel movement. -- Okay from surgery standpoint to discharge. -- Diet discussed with patient and wife  anemia/thrombocytopenia -- stable  type II diabetes -- per patient outpatient hemoglobin A1c is 5.7 -- resume metformin 500 mg daily  Hypertension/history of a flutter -- resumed diltiazem -- not on any anticoagulation per patient-- it was a single previous episode  overall hemodynamically stable. Discussed with wife at bedside. Will discharged to home per surgery recommendation       Consultants:  general surgery Dr. Adora Fridge Procedures performed: exploratory laparotomy Disposition: Home Diet recommendation:  Discharge Diet Orders (From admission, onward)     Start     Ordered   12/06/21 0000  Diet - low sodium heart healthy        12/06/21 1214           Cardiac and Carb modified diet DISCHARGE MEDICATION: Allergies as of 12/06/2021   No Known Allergies      Medication List     STOP  taking these medications    amoxicillin-clavulanate 875-125 MG tablet Commonly known as: AUGMENTIN       TAKE these medications    acetaminophen 500 MG tablet Commonly known as: TYLENOL Take 500 mg by mouth every 6 (six) hours as needed for mild pain, moderate pain or headache.   Aflibercept 2 MG/0.05ML Soln Apply to eye. Every 6 weeks injection   atorvastatin 80 MG tablet Commonly known as: LIPITOR Take 1 tablet (80 mg total) by mouth daily.   brimonidine 0.2 % ophthalmic solution Commonly known as: ALPHAGAN Place 1 drop into the left eye daily.   diltiazem 240 MG 24 hr capsule Commonly known as: CARDIZEM CD Take 1 capsule (240 mg total) by mouth daily.   feeding supplement Liqd Take 237 mLs by mouth 2 (two) times daily between meals.   metFORMIN 500 MG tablet Commonly known as: GLUCOPHAGE Take 1 tablet (500 mg total) by mouth daily with breakfast.   multivitamin tablet Take 1 tablet by mouth daily.   oxyCODONE 5 MG immediate release tablet Commonly known as: Oxy IR/ROXICODONE Take 1 tablet (5 mg total) by mouth 3 (three) times daily as needed for breakthrough pain.   pantoprazole 40 MG tablet Commonly known as: Protonix Take 1 tablet (40 mg total) by mouth daily for 28 days.   PRESERVISION AREDS 2+MULTI VIT PO Take 1 tablet by mouth daily.               Discharge Care Instructions  (From admission, onward)           Start     Ordered  12/06/21 0000  Discharge wound care:       Comments: Per surgery instructions   12/06/21 1214            Follow-up Information     Tylene Fantasia, PA-C. Schedule an appointment as soon as possible for a visit on 12/19/2021.   Specialty: Physician Assistant Why: s/p exploratory laparotomy Contact information: [address] [place] [address] [phone]         Jon Billings, NP. Schedule an appointment as soon as possible for a visit in 1 week(s).   Specialty: Nurse  Practitioner Why: hosp f/u Contact information: [place] [address] [phone]                Discharge Exam: Randy Harper Weights   12/02/21 0500 12/03/21 0406 12/04/21 0440  Weight: 93.3 kg 99 kg 98.8 kg     Condition at discharge: fair  The results of significant diagnostics from this hospitalization (including imaging, microbiology, ancillary and laboratory) are listed below for reference.   Imaging Studies: DG ABD ACUTE 2+V W 1V CHEST  Result Date: 12/04/2021 CLINICAL DATA:  Ileus. Recent laparotomy and lysis of adhesions for small-bowel obstruction. EXAM: DG ABDOMEN ACUTE WITH 1 VIEW CHEST COMPARISON:  Abdominal x-ray from yesterday. Chest x-ray dated November 30, 2021. FINDINGS: Unchanged enteric tube appropriately positioned in the stomach. Unchanged diffusely dilated small bowel measuring up to 6.5 cm in diameter, previously 6.4 cm. Air, stool, and oral contrast noted in the colon. No free intraperitoneal air. No radiopaque calculi or other significant radiographic abnormality is seen. New right upper extremity PICC line with tip at the cavoatrial junction. Heart size and mediastinal contours are within normal limits. Both lungs are clear. IMPRESSION: 1. Unchanged small bowel ileus. 2. No acute cardiopulmonary disease. Electronically Signed   By: Titus Dubin M.D.   On: 12/04/2021 09:53   DG Abd 2 Views  Result Date: 12/03/2021 CLINICAL DATA:  Ileus, postoperative EXAM: ABDOMEN - 2 VIEW COMPARISON:  12/02/2021 FINDINGS: No significant change in configuration of the bowel with diffusely air and fluid distended loops throughout the central abdomen, measuring up to 6.4 cm in caliber. Esophagogastric tube remains with tip and side port below the diaphragm. Gas, stool, and contrast is scattered to the rectum. No free air in the abdomen. IMPRESSION: 1. No significant change in configuration of the bowel with diffusely air and fluid distended loops throughout the central  abdomen, measuring up to 6.4 cm in caliber. Findings are consistent with postoperative ileus. Gas, stool, and contrast is scattered to the rectum. 2.  Esophagogastric tube with tip and side port below the diaphragm. Electronically Signed   By: Delanna Ahmadi M.D.   On: 12/03/2021 15:52   DG Abd 2 Views  Result Date: 12/02/2021 CLINICAL DATA:  Small bowel obstruction EXAM: ABDOMEN - 2 VIEW COMPARISON:  November 30, 2021 FINDINGS: Lung bases are unchanged unremarkable. An NG tube terminates in the region of the distal stomach. Skin staples identified at midline. Continued small bowel dilatation with air-fluid levels. Relative decompression of the colon relative to the small bowel. No other interval changes. IMPRESSION: Continued small bowel obstruction.  No other changes. Electronically Signed   By: Dorise Bullion III M.D.   On: 12/02/2021 08:39   [place] EKG SITE RITE  Result Date: 12/01/2021 If Site Rite image not attached, placement could not be confirmed due to current cardiac rhythm.  DG ABD ACUTE 2+V W 1V CHEST  Result Date: 11/30/2021  CLINICAL DATA:  Small bowel obstruction. EXAM: DG ABDOMEN ACUTE WITH 1 VIEW CHEST COMPARISON:  One view chest x-ray FINDINGS: Heart size normal. Atherosclerotic calcifications are present at the aortic arch. NG tube is in place. Dilated loops of small bowel persist, improved. Gas is present in the colon. No fluid levels are present on the upright images. No free air is present. IMPRESSION: 1. Improving small bowel obstruction. 2. NG tube in place. 3. No acute cardiopulmonary disease. 4. Aortic atherosclerosis. Electronically Signed   By: San Morelle M.D.   On: 11/30/2021 08:28   DG ABD ACUTE 2+V W 1V CHEST  Addendum Date: 11/29/2021   ADDENDUM REPORT: 11/29/2021 08:38 ADDENDUM: There are additional views provided of the abdomen. Air-fluid levels are noted throughout the abdomen involving small bowel loops. There is no free air beneath either the RIGHT or LEFT  hemidiaphragm. EKG leads project over the abdomen. There is some gas in the rectum and a small amount of gas in the RIGHT colon. Degree of small bowel dilation is not substantially changed compared to the prior CT evaluation accounting for differences in technique. Findings of small-bowel obstruction without substantial change still with marked dilation of small bowel loops. Decompressed colon is noted and there is only a small amount of rectal gas. Electronically Signed   By: Zetta Bills M.D.   On: 11/29/2021 08:38   Result Date: 11/29/2021 CLINICAL DATA:  A 60 year Randy Harper presents for evaluation of small-bowel obstruction in the setting of neoplasm. EXAM: DG ABDOMEN ACUTE WITH 1 VIEW CHEST COMPARISON:  June 05, 2021 FINDINGS: EKG leads project over the chest. Gastric tube in place tip in the proximal stomach, side-port near the GE junction. Cardiomediastinal contours and hilar structures are stable. No lobar consolidation. No sign of pleural effusion. No pneumothorax. On limited assessment there is no acute skeletal finding. Air-fluid levels in small bowel loops in the upper abdomen incidentally noted. IMPRESSION: 1. No acute cardiopulmonary disease. 2. Gastric tube in place tip in the proximal stomach, side-port near the GE junction. This could be advanced slightly for more optimal placement approximately 3-3-1/2 cm. 3. Signs of persistent small bowel dilation, incompletely assessed. Electronically Signed: By: Zetta Bills M.D. On: 11/29/2021 08:08   DG Abd 1 View  Result Date: 11/29/2021 CLINICAL DATA:  Check gastric catheter placement EXAM: ABDOMEN - 1 VIEW COMPARISON:  None Available. FINDINGS: Scattered large and small bowel gas is noted. Gastric catheter is noted within the stomach. No free air is seen. IMPRESSION: Gastric catheter in the stomach. Electronically Signed   By: Inez Catalina M.D.   On: 11/29/2021 02:16   CT ABDOMEN PELVIS W CONTRAST  Result Date: 11/28/2021 CLINICAL DATA:   Bilateral lower quadrant pain with nausea and vomiting. EXAM: CT ABDOMEN AND PELVIS WITH CONTRAST TECHNIQUE: Multidetector CT imaging of the abdomen and pelvis was performed using the standard protocol following bolus administration of intravenous contrast. RADIATION DOSE REDUCTION: This exam was performed according to the departmental dose-optimization program which includes automated exposure control, adjustment of the mA and/or kV according to patient size and/or use of iterative reconstruction technique. CONTRAST:  115m OMNIPAQUE IOHEXOL 300 MG/ML  SOLN COMPARISON:  June 06, 2021 FINDINGS: Lower chest: Mild linear scarring and/or atelectasis is seen within the posterior aspect of the right lung base. Hepatobiliary: Postoperative changes are seen consistent with prior right hepatectomy. No focal liver lesions are identified. Status post cholecystectomy. No biliary dilatation. Pancreas: Unremarkable. No pancreatic ductal dilatation or surrounding inflammatory changes. Spleen: Normal in  size without focal abnormality. Adrenals/Urinary Tract: A stable 1.2 cm diameter low-attenuation left adrenal mass is seen (approximately 18.07 Hounsfield units). The right adrenal gland is unremarkable. Kidneys are normal in size, without renal calculi or hydronephrosis. A 6 mm focus of parenchymal low attenuation is seen within the posterior aspect of the mid right kidney. This is too small to characterize by CT examination. Mild, predominantly anterior urinary bladder wall thickening is seen. Stomach/Bowel: Stomach is within normal limits. Appendix appears normal. Numerous dilated small bowel loops are seen throughout the abdomen and pelvis (maximum small bowel diameter of approximately 5.0 cm). An abrupt transition zone is seen within the mid right abdomen (axial CT images 42 through 59, CT series 2). Surgically anastomosed bowel and mild mesenteric twisting are also seen within this region. Noninflamed diverticula are seen  throughout the large bowel. Vascular/Lymphatic: Aortic atherosclerosis. No enlarged abdominal or pelvic lymph nodes. Reproductive: Prostate is unremarkable. Other: A 2.4 cm x 2.8 cm fat containing right-sided para umbilical hernia is noted. A trace amount of posterior pelvic free fluid is seen. No intra-abdominal free air is identified. Musculoskeletal: Multilevel degenerative changes are seen throughout the lumbar spine. IMPRESSION: 1. High-grade small bowel obstruction with a transition zone seen within the mid right abdomen, at the level of the distal jejunum, along an area of mesenteric twisting and surgically anastomosed bowel. 2. Colonic diverticulosis. 3. Stable low-attenuation left adrenal adenoma. 4. Fat-containing right-sided para umbilical hernia. 5. Aortic atherosclerosis. Aortic Atherosclerosis (ICD10-I70.0). Electronically Signed   By: Virgina Norfolk M.D.   On: 11/28/2021 17:20    Microbiology: Results for orders placed or performed during the hospital encounter of 06/06/21  Resp Panel by RT-PCR (Flu A&B, Covid) Nasopharyngeal Swab     Status: None   Collection Time: 06/06/21  8:59 AM   Specimen: Nasopharyngeal Swab; Nasopharyngeal(NP) swabs in vial transport medium  Result Value Ref Range Status   SARS Coronavirus 2 by RT PCR NEGATIVE NEGATIVE Final    Comment: (NOTE) SARS-CoV-2 target nucleic acids are NOT DETECTED.  The SARS-CoV-2 RNA is generally detectable in upper respiratory specimens during the acute phase of infection. The lowest concentration of SARS-CoV-2 viral copies this assay can detect is 138 copies/mL. A negative result does not preclude SARS-Cov-2 infection and should not be used as the sole basis for treatment or other patient management decisions. A negative result may occur with  improper specimen collection/handling, submission of specimen other than nasopharyngeal swab, presence of viral mutation(s) within the areas targeted by this assay, and inadequate  number of viral copies(<138 copies/mL). A negative result must be combined with clinical observations, patient history, and epidemiological information. The expected result is Negative.  Fact Sheet for Patients:  EntrepreneurPulse.com.au  Fact Sheet for Healthcare Providers:  IncredibleEmployment.be  This test is no t yet approved or cleared by the [place] FDA and  has been authorized for detection and/or diagnosis of SARS-CoV-2 by FDA under an Emergency Use Authorization (EUA). This EUA will remain  in effect (meaning this test can be used) for the duration of the COVID-19 declaration under Section 564(b)(1) of the Act, 21 U.S.C.section 360bbb-3(b)(1), unless the authorization is terminated  or revoked sooner.       Influenza A by PCR NEGATIVE NEGATIVE Final   Influenza B by PCR NEGATIVE NEGATIVE Final    Comment: (NOTE) The Xpert Xpress SARS-CoV-2/FLU/RSV plus assay is intended as an aid in the diagnosis of influenza from Nasopharyngeal swab specimens and should not be used as a sole  basis for treatment. Nasal washings and aspirates are unacceptable for Xpert Xpress SARS-CoV-2/FLU/RSV testing.  Fact Sheet for Patients: EntrepreneurPulse.com.au  Fact Sheet for Healthcare Providers: IncredibleEmployment.be  This test is not yet approved or cleared by the [place] FDA and has been authorized for detection and/or diagnosis of SARS-CoV-2 by FDA under an Emergency Use Authorization (EUA). This EUA will remain in effect (meaning this test can be used) for the duration of the COVID-19 declaration under Section 564(b)(1) of the Act, 21 U.S.C. section 360bbb-3(b)(1), unless the authorization is terminated or revoked.  Performed at [facility], [address] [place], [place] [address]     Labs: CBC: Recent Labs  Lab 11/30/21 0401 12/01/21 0411 12/02/21 0541 12/03/21 0500  12/04/21 0438  WBC 8.8 13.5* 8.2 7.1 5.3  HGB 13.4 13.8 10.5* 11.0* 11.0*  HCT 39.7 40.4 31.5* 33.6* 34.0*  MCV 91.1 90.6 94.0 93.6 94.7  PLT 179 172 136* 144* 627*   Basic Metabolic Panel: Recent Labs  Lab 11/30/21 0401 12/01/21 0411 12/02/21 0832 12/03/21 0500 12/04/21 0438  NA 136 138 141 142 142  K 3.6 4.7 3.8 3.6 3.8  CL 104 107 110 111 112*  CO2 '24 25 28 28 27  '$ GLUCOSE 78 189* 134* 127* 136*  BUN '15 17 18 20 20  '$ CREATININE 0.56* 0.66 0.57* 0.52* 0.50*  CALCIUM 8.7* 8.0* 8.0* 8.2* 8.1*  MG  --   --  2.1 2.1 1.9  PHOS  --   --  2.3* 3.1 3.7   Liver Function Tests: Recent Labs  Lab 12/04/21 0438  AST 33  ALT 27  ALKPHOS 60  BILITOT 0.4  PROT 5.0*  ALBUMIN 2.8*   CBG: Recent Labs  Lab 12/05/21 2005 12/06/21 0138 12/06/21 0520 12/06/21 0841 12/06/21 1156  GLUCAP 111* 141* 127* 118* 160*    Discharge time spent: greater than 30 minutes.  Signed: Fritzi Mandes, MD [facility] 12/06/2021

## 2021-12-06 NOTE — Progress Notes (Addendum)
1016 Pt tolerated soft diet this morning. No complaints of nausea or pain. Nurse will cont to monitor.  1247 PICC line removed per orders. Pressure applied to site for 1 min. Pt tolerated. Cath tip intact. Pt will remain lying flat until 1300 and will then get dressed for d/c  1324 D/c paper reviewed with pt and wife. All questions and concerns answered.

## 2021-12-06 NOTE — Plan of Care (Signed)
  Problem: Coping: Goal: Ability to adjust to condition or change in health will improve Outcome: Progressing   Problem: Fluid Volume: Goal: Ability to maintain a balanced intake and output will improve Outcome: Progressing   Problem: Health Behavior/Discharge Planning: Goal: Ability to identify and utilize available resources and services will improve Outcome: Progressing   Problem: Metabolic: Goal: Ability to maintain appropriate glucose levels will improve Outcome: Progressing   Problem: Nutritional: Goal: Maintenance of adequate nutrition will improve Outcome: Progressing   Problem: Skin Integrity: Goal: Risk for impaired skin integrity will decrease Outcome: Progressing   Problem: Tissue Perfusion: Goal: Adequacy of tissue perfusion will improve Outcome: Progressing   Problem: Clinical Measurements: Goal: Respiratory complications will improve Outcome: Progressing   Problem: Clinical Measurements: Goal: Cardiovascular complication will be avoided Outcome: Progressing   Problem: Nutrition: Goal: Adequate nutrition will be maintained Outcome: Progressing   Problem: Coping: Goal: Level of anxiety will decrease Outcome: Progressing   Problem: Pain Managment: Goal: General experience of comfort will improve Outcome: Progressing   Problem: Safety: Goal: Ability to remain free from injury will improve Outcome: Progressing   Problem: Skin Integrity: Goal: Risk for impaired skin integrity will decrease Outcome: Progressing

## 2021-12-06 NOTE — TOC Transition Note (Signed)
Transition of Care [facility]) - CM/SW Discharge Note   Patient Details  Name: DARYUS SOWASH MRN: [id number] Date of Birth: 09/30/1950  [facility]) CM/SW Contact:  Donnelly Angelica, LCSW Phone Number: 12/06/2021, 12:34 PM   Clinical Narrative:    Pt has orders to discharge home today. No further concerns. CSW signing off.    Final next level of care: Home/Self Care Barriers to Discharge: Barriers Resolved   Patient Goals and CMS Choice        Discharge Placement                    Patient and family notified of of transfer: 12/06/21  Discharge Plan and Services                                     Social Determinants of Health (SDOH) Interventions     Readmission Risk Interventions     No data to display

## 2021-12-06 NOTE — Discharge Instructions (Addendum)
In addition to included general post-operative instructions,  Diet: Resume home diet. Follow soft foods for a few days.   Activity: No heavy lifting >20 pounds (children, pets, laundry, garbage) for 6 weeks, but light activity and walking are encouraged. Do not drive or drink alcohol if taking narcotic pain medications or having pain that might distract from driving.  Wound care: You may shower/get incision wet with soapy water and pat dry (do not rub incisions), but no baths or submerging incision underwater until follow-up.   Medications: Resume all home medications. For mild to moderate pain: acetaminophen (Tylenol) or ibuprofen/naproxen (if no kidney disease). Combining Tylenol with alcohol can substantially increase your risk of causing liver disease. Narcotic pain medications, if prescribed, can be used for severe pain, though may cause nausea, constipation, and drowsiness. Do not combine Tylenol and Percocet (or similar) within a 6 hour period as Percocet (and similar) contain(s) Tylenol. If you do not need the narcotic pain medication, you do not need to fill the prescription.  Call office [phone] / [phone]) at any time if any questions, worsening pain, fevers/chills, bleeding, drainage from incision site, or other concerns.

## 2021-12-07 ENCOUNTER — Telehealth: Payer: Self-pay | Admitting: *Deleted

## 2021-12-07 NOTE — Telephone Encounter (Signed)
Transition Care Management Unsuccessful Follow-up Telephone Call  Date of discharge and from where:  [facility]  12-06-2021  Attempts:  1st Attempt  Reason for unsuccessful TCM follow-up call:  Left voice message

## 2021-12-11 ENCOUNTER — Ambulatory Visit (INDEPENDENT_AMBULATORY_CARE_PROVIDER_SITE_OTHER): Payer: Medicare Other | Admitting: Physician Assistant

## 2021-12-11 ENCOUNTER — Telehealth: Payer: Self-pay | Admitting: *Deleted

## 2021-12-11 ENCOUNTER — Encounter: Payer: Self-pay | Admitting: Physician Assistant

## 2021-12-11 VITALS — BP 140/74 | HR 86 | Temp 97.5°F | Wt 212.8 lb

## 2021-12-11 DIAGNOSIS — K56609 Unspecified intestinal obstruction, unspecified as to partial versus complete obstruction: Secondary | ICD-10-CM

## 2021-12-11 DIAGNOSIS — Z09 Encounter for follow-up examination after completed treatment for conditions other than malignant neoplasm: Secondary | ICD-10-CM

## 2021-12-11 DIAGNOSIS — R600 Localized edema: Secondary | ICD-10-CM

## 2021-12-11 DIAGNOSIS — T8141XA Infection following a procedure, superficial incisional surgical site, initial encounter: Secondary | ICD-10-CM | POA: Diagnosis not present

## 2021-12-11 MED ORDER — CEPHALEXIN 500 MG PO CAPS: 500.0000 mg | ORAL_CAPSULE | Freq: Four times a day (QID) | ORAL | 0 refills | Status: AC

## 2021-12-11 MED ORDER — FUROSEMIDE 20 MG PO TABS: 20.0000 mg | ORAL_TABLET | Freq: Every day | ORAL | 0 refills | Status: DC

## 2021-12-11 NOTE — Patient Instructions (Addendum)
I recommend the following for managing the swelling in your feet Using compression stockings with 15-25 mmHg compression rating and keeping your feet elevated as you are able Your calves measure 13.5 inches in circumference - this along with your shoe size will help you get the right size stockings  I am also sending in a 3 day supply of Lasix to help get the fluid off  I am slightly concerned about your surgical incision having a mild infection I am sending in a script for an antibiotic for you to take to help prevent complications This is called keflex 500 mg to be taken four times per day for 7 days Please take with food and plenty of water. You can eat yogurt and/or use a probiotic to help prevent GI upset.  Keep the wound clean and dry and let the surgical office know of any changes, pain, fever, or drainage that you notice before your follow up .

## 2021-12-11 NOTE — Progress Notes (Signed)
Established Patient Office Visit  Name: Randy Harper   MRN: [id number]    DOB: Dec 01, 1950   Date:12/11/2021  Today's Provider: Talitha Givens, MHS, PA-C Introduced myself to the patient as a PA-C and provided education on APPs in clinical practice.         Subjective  Chief Complaint  Chief Complaint  Patient presents with   Hospitalization Follow-up    S/p small bowel obstruction per patient.    Edema    Having swelling in bil feet     HPI   SBO follow up  Reports he is feeling a lot better after his surgery Reports he is having regular bowel movements States he was given Oxycodone at discharge- has not used for pain. Is using Tylenol instead - thinks pain is well managed with Tylenol at this time.  States he has returned to overall normal diet- is trying to avoid red meat per discharge instructions  Incision sites Reports some mild redness around belly button- thinks a staple might be under tension    Edema Swelling in feet- reports he needed lasix when he had cancer surgery almost 5 years ago Reports that he is sleeping in recliner and is trying to elevate feet as he is able.  Denies pain or heaviness in feet or legs    Transition of Care Hospital Follow up.   Hospital/Facility: [facility] D/C Physician: Fritzi Mandes, MD D/C Date: 12/06/21  Records Requested:  Records Received:  Records Reviewed: hospital discharge summary  Diagnoses on Discharge: SBO s/p exploratory laparotomy with lysis of adhesions  Date of interactive Contact within 48 hours of discharge: 12/07/21 Contact was through: phone  Date of 7 day or 14 day face-to-face visit:    within 7 days    Diagnostic Tests Reviewed/Disposition: CMP,CBC, CT abd, DG abd,  Consults: General surgery   Discharge Instructions  Disease/illness Education:  Home Health/Community Services Discussions/Referrals:  Establishment or re-establishment of referral orders for community resources: Patient has  surgical follow up scheduled with  Surgery on 7/11   Discussion with other health care providers:  Assessment and Support of treatment regimen adherence: Patient reports adherence to PO recommendations and dietary restrictions following discharge.   Appointments Coordinated with:   Education for self-management, independent living, and ADLs:   Patient Active Problem List   Diagnosis Date Noted   SBO (small bowel obstruction) ([place]) 11/29/2021   Small bowel obstruction ([place]) 11/28/2021   Diabetes mellitus type 2, noninsulin dependent ([place]) 11/28/2021   Ankylosing spondylitis ([place]) 11/09/2021   Difficulty swallowing 06/06/2021   Controlled diabetes mellitus type 2 with complications ([place]) 89/37/3428   Food impaction of esophagus    Acute esophagitis    Right calf pain 03/02/2021   Macular degeneration disease 08/08/2020   Neuroendocrine tumor 10/28/2019   Advanced care planning/counseling discussion 06/18/2017   Paroxysmal atrial flutter ([place]) 01/29/2017   Arthropathy of lumbar facet joint 09/28/2015   BPH (benign prostatic hyperplasia) 03/15/2015   Hypertension    Hyperlipidemia 02/17/2014   Hypertension associated with diabetes ([place]) 02/17/2014    Past Surgical History:  Procedure Laterality Date   CHOLECYSTECTOMY  01/2017   related to liver cancer   COLONOSCOPY     COLONOSCOPY WITH PROPOFOL N/A 03/17/2019   Procedure: COLONOSCOPY WITH PROPOFOL;  Surgeon: Lollie Sails, MD;  Location: [facility] ENDOSCOPY;  Service: Endoscopy;  Laterality: N/A;   ESOPHAGOGASTRODUODENOSCOPY N/A 06/06/2021   Procedure: ESOPHAGOGASTRODUODENOSCOPY (EGD);  Surgeon: Virgel Manifold, MD;  Location: [facility] ENDOSCOPY;  Service: Endoscopy;  Laterality: N/A;   ESOPHAGOGASTRODUODENOSCOPY (EGD) WITH PROPOFOL N/A 08/22/2021   Procedure: ESOPHAGOGASTRODUODENOSCOPY (EGD) WITH PROPOFOL;  Surgeon: Lesly Rubenstein, MD;  Location: [facility] ENDOSCOPY;  Service: Endoscopy;  Laterality: N/A;  DM    LAPAROSCOPY ABDOMEN DIAGNOSTIC     LAPAROTOMY N/A 11/30/2021   Procedure: EXPLORATORY LAPAROTOMY;  Surgeon: Jules Husbands, MD;  Location: [facility] ORS;  Service: General;  Laterality: N/A;   LYSIS OF ADHESION N/A 11/30/2021   Procedure: LYSIS OF ADHESION;  Surgeon: Jules Husbands, MD;  Location: [facility] ORS;  Service: General;  Laterality: N/A;   resection liver total right lobe  12/2016   SMALL INTESTINE SURGERY  01/2017   cancer surgery   TENDON GRAFT Right    right thumb tendon graft at age 57    [place] N/A 11/30/2021   Procedure: HERNIA REPAIR VENTRAL ADULT;  Surgeon: Jules Husbands, MD;  Location: [facility] ORS;  Service: General;  Laterality: N/A;    Family History  Problem Relation Age of Onset   Heart disease Father    Diabetes Maternal Uncle    Diabetes Maternal Uncle    Diabetes Maternal Uncle    Diabetes Maternal Uncle    Diabetes Maternal Grandmother     Social History   Tobacco Use   Smoking status: Never   Smokeless tobacco: Never  Substance Use Topics   Alcohol use: Never     Current Outpatient Medications:    acetaminophen (TYLENOL) 500 MG tablet, Take 500 mg by mouth every 6 (six) hours as needed for mild pain, moderate pain or headache., Disp: , Rfl:    Aflibercept 2 MG/0.05ML SOLN, Apply to eye. Every 6 weeks injection, Disp: , Rfl:    atorvastatin (LIPITOR) 80 MG tablet, Take 1 tablet (80 mg total) by mouth daily., Disp: 90 tablet, Rfl: 1   brimonidine (ALPHAGAN) 0.2 % ophthalmic solution, Place 1 drop into the left eye daily. , Disp: , Rfl: 3   cephALEXin (KEFLEX) 500 MG capsule, Take 1 capsule (500 mg total) by mouth 4 (four) times daily for 7 days., Disp: 28 capsule, Rfl: 0   diltiazem (CARDIZEM CD) 240 MG 24 hr capsule, Take 1 capsule (240 mg total) by mouth daily., Disp: 90 capsule, Rfl: 1   feeding supplement (ENSURE ENLIVE / ENSURE PLUS) LIQD, Take 237 mLs by mouth 2 (two) times daily between meals., Disp: 237 mL, Rfl: 12   furosemide (LASIX) 20 MG  tablet, Take 1 tablet (20 mg total) by mouth daily., Disp: 3 tablet, Rfl: 0   metFORMIN (GLUCOPHAGE) 500 MG tablet, Take 1 tablet (500 mg total) by mouth daily with breakfast., Disp: 90 tablet, Rfl: 1   Multiple Vitamin (MULTIVITAMIN) tablet, Take 1 tablet by mouth daily., Disp: , Rfl:    Multiple Vitamins-Minerals (PRESERVISION AREDS 2+MULTI VIT PO), Take 1 tablet by mouth daily., Disp: , Rfl:    pantoprazole (PROTONIX) 40 MG tablet, Take 1 tablet (40 mg total) by mouth daily for 28 days., Disp: 28 tablet, Rfl: 0  No Known Allergies  I personally reviewed active problem list, medication list, allergies, notes from last encounter, lab results with the patient/caregiver today.   Review of Systems  Constitutional:  Positive for malaise/fatigue. Negative for chills, diaphoresis and fever.  Respiratory:  Negative for shortness of breath and wheezing.   Cardiovascular:  Positive for leg swelling (below ankles and in feet). Negative for chest pain and palpitations.  Gastrointestinal:  Negative for abdominal pain,  constipation, diarrhea, nausea and vomiting.  Genitourinary:  Negative for dysuria.  Neurological:  Negative for dizziness, weakness and headaches.      Objective  Vitals:   12/11/21 1323  BP: 140/74  Pulse: 86  Temp: (!) 97.5 F (36.4 C)  TempSrc: Oral  SpO2: 95%  Weight: 212 lb 12.8 oz (96.5 kg)    Body mass index is 32.36 kg/m.  Physical Exam Vitals reviewed.  Constitutional:      General: He is awake.     Appearance: Normal appearance. He is well-developed and well-groomed. He is obese.  HENT:     Head: Normocephalic and atraumatic.  Cardiovascular:     Rate and Rhythm: Normal rate and regular rhythm.     Pulses: Normal pulses.          Radial pulses are 2+ on the right side and 2+ on the left side.     Heart sounds: Normal heart sounds.  Pulmonary:     Effort: Pulmonary effort is normal.     Breath sounds: Normal breath sounds. No decreased breath sounds,  wheezing, rhonchi or rales.  Abdominal:     General: Abdomen is protuberant. Bowel sounds are normal.     Palpations: Abdomen is soft.     Tenderness: There is no abdominal tenderness.     Comments: Surgical incision measuring approx 15 cm from umbilicus - closed with staples Mild erythema along umbilical incision with some scant serous drainage   Musculoskeletal:     Right lower leg: 2+ Edema present.     Left lower leg: 2+ Edema present.  Neurological:     General: No focal deficit present.     Mental Status: He is alert and oriented to person, place, and time.     Motor: No weakness.     Gait: Gait normal.  Psychiatric:        Mood and Affect: Mood normal.        Behavior: Behavior normal. Behavior is cooperative.        Thought Content: Thought content normal.        Judgment: Judgment normal.      Media Information   Document Information  Photos    12/11/2021 13:53  Attached To:  Office Visit on 12/11/21 with Chasya Zenz, Dani Gobble, PA-C   Source Information  Ramez [place]ron[place], [place]  [facility]     Media Information   Document Information  Photos    12/11/2021 13:53  Attached To:  Office Visit on 12/11/21 with Keshayla Schrum, Dani Gobble, PA-C   Source Information  Kjersten Ormiston, Dani Gobble, PA-C  [facility] [employer]    Recent Results (from the past 2160 hour(s))  Basic metabolic panel     Status: Abnormal   Collection Time: 11/28/21  6:34 PM  Result Value Ref Range   Sodium 131 (L) 135 - 145 mmol/L   Potassium 4.0 3.5 - 5.1 mmol/L   Chloride 93 (L) 98 - 111 mmol/L   CO2 27 22 - 32 mmol/L   Glucose, Bld 129 (H) 70 - 99 mg/dL    Comment: Glucose reference range applies only to samples taken after fasting for at least 8 hours.   BUN 22 8 - 23 mg/dL   Creatinine, Ser 0.87 0.61 - 1.24 mg/dL   Calcium 9.4 8.9 - 10.3 mg/dL   GFR, Estimated >60 >60 mL/min    Comment: (NOTE) Calculated using the CKD-EPI Creatinine Equation (2021)    Anion gap 11 5 - 15  Comment:  Performed at [facility], [address] [place], [place] [address]  CBC     Status: None   Collection Time: 11/28/21  6:34 PM  Result Value Ref Range   WBC 9.3 4.0 - 10.5 K/uL   RBC 5.10 4.22 - 5.81 MIL/uL   Hemoglobin 15.8 13.0 - 17.0 g/dL   HCT 47.0 39.0 - 52.0 %   MCV 92.2 80.0 - 100.0 fL   MCH 31.0 26.0 - 34.0 pg   MCHC 33.6 30.0 - 36.0 g/dL   RDW 13.0 11.5 - 15.5 %   Platelets 235 150 - 400 K/uL   nRBC 0.0 0.0 - 0.2 %    Comment: Performed at [facility], [address] [place], [place] [address]  Glucose, capillary     Status: Abnormal   Collection Time: 11/28/21 11:05 PM  Result Value Ref Range   Glucose-Capillary 109 (H) 70 - 99 mg/dL    Comment: Glucose reference range applies only to samples taken after fasting for at least 8 hours.  CBC     Status: None   Collection Time: 11/29/21  3:19 AM  Result Value Ref Range   WBC 8.8 4.0 - 10.5 K/uL   RBC 4.30 4.22 - 5.81 MIL/uL   Hemoglobin 13.5 13.0 - 17.0 g/dL   HCT 39.5 39.0 - 52.0 %   MCV 91.9 80.0 - 100.0 fL   MCH 31.4 26.0 - 34.0 pg   MCHC 34.2 30.0 - 36.0 g/dL   RDW 13.0 11.5 - 15.5 %   Platelets 181 150 - 400 K/uL   nRBC 0.0 0.0 - 0.2 %    Comment: Performed at [facility], [address] [place], [place] [address]  Comprehensive metabolic panel     Status: Abnormal   Collection Time: 11/29/21  3:19 AM  Result Value Ref Range   Sodium 134 (L) 135 - 145 mmol/L   Potassium 4.2 3.5 - 5.1 mmol/L   Chloride 101 98 - 111 mmol/L   CO2 26 22 - 32 mmol/L   Glucose, Bld 116 (H) 70 - 99 mg/dL    Comment: Glucose reference range applies only to samples taken after fasting for at least 8 hours.   BUN 16 8 - 23 mg/dL   Creatinine, Ser 0.72 0.61 - 1.24 mg/dL   Calcium 9.0 8.9 - 10.3 mg/dL   Total Protein 6.6 6.5 - 8.1 g/dL   Albumin 3.8 3.5 - 5.0 g/dL   AST 18 15 - 41 U/L   ALT 17 0 - 44 U/L   Alkaline Phosphatase 74 38 - 126 U/L   Total Bilirubin 1.0 0.3 - 1.2 mg/dL   GFR, Estimated  >60 >60 mL/min    Comment: (NOTE) Calculated using the CKD-EPI Creatinine Equation (2021)    Anion gap 7 5 - 15    Comment: Performed at [facility], [address], [place], [place] [address]  Magnesium     Status: None   Collection Time: 11/29/21  3:19 AM  Result Value Ref Range   Magnesium 1.9 1.7 - 2.4 mg/dL    Comment: Performed at [facility], [address] [place], [place] [address]  Glucose, capillary     Status: Abnormal   Collection Time: 11/29/21  7:48 AM  Result Value Ref Range   Glucose-Capillary 113 (H) 70 - 99 mg/dL    Comment: Glucose reference range applies only to samples taken after fasting for at least 8 hours.  Glucose,  capillary     Status: Abnormal   Collection Time: 11/29/21  8:01 AM  Result Value Ref Range   Glucose-Capillary 102 (H) 70 - 99 mg/dL    Comment: Glucose reference range applies only to samples taken after fasting for at least 8 hours.  Glucose, capillary     Status: None   Collection Time: 11/29/21 12:25 PM  Result Value Ref Range   Glucose-Capillary 99 70 - 99 mg/dL    Comment: Glucose reference range applies only to samples taken after fasting for at least 8 hours.  Glucose, capillary     Status: None   Collection Time: 11/29/21  4:33 PM  Result Value Ref Range   Glucose-Capillary 90 70 - 99 mg/dL    Comment: Glucose reference range applies only to samples taken after fasting for at least 8 hours.  Glucose, capillary     Status: None   Collection Time: 11/29/21  8:24 PM  Result Value Ref Range   Glucose-Capillary 84 70 - 99 mg/dL    Comment: Glucose reference range applies only to samples taken after fasting for at least 8 hours.  CBC     Status: None   Collection Time: 11/30/21  4:01 AM  Result Value Ref Range   WBC 8.8 4.0 - 10.5 K/uL   RBC 4.36 4.22 - 5.81 MIL/uL   Hemoglobin 13.4 13.0 - 17.0 g/dL   HCT 39.7 39.0 - 52.0 %   MCV 91.1 80.0 - 100.0 fL   MCH 30.7 26.0 - 34.0 pg   MCHC 33.8 30.0 - 36.0 g/dL    RDW 12.7 11.5 - 15.5 %   Platelets 179 150 - 400 K/uL   nRBC 0.0 0.0 - 0.2 %    Comment: Performed at [facility], [address] [place], [place] [address]  Basic metabolic panel     Status: Abnormal   Collection Time: 11/30/21  4:01 AM  Result Value Ref Range   Sodium 136 135 - 145 mmol/L   Potassium 3.6 3.5 - 5.1 mmol/L   Chloride 104 98 - 111 mmol/L   CO2 24 22 - 32 mmol/L   Glucose, Bld 78 70 - 99 mg/dL    Comment: Glucose reference range applies only to samples taken after fasting for at least 8 hours.   BUN 15 8 - 23 mg/dL   Creatinine, Ser 0.56 (L) 0.61 - 1.24 mg/dL   Calcium 8.7 (L) 8.9 - 10.3 mg/dL   GFR, Estimated >60 >60 mL/min    Comment: (NOTE) Calculated using the CKD-EPI Creatinine Equation (2021)    Anion gap 8 5 - 15    Comment: Performed at [facility], [address] [place], [place] [address]  Glucose, capillary     Status: None   Collection Time: 11/30/21  8:04 AM  Result Value Ref Range   Glucose-Capillary 78 70 - 99 mg/dL    Comment: Glucose reference range applies only to samples taken after fasting for at least 8 hours.  Glucose, capillary     Status: None   Collection Time: 11/30/21 11:11 AM  Result Value Ref Range   Glucose-Capillary 76 70 - 99 mg/dL    Comment: Glucose reference range applies only to samples taken after fasting for at least 8 hours.  Glucose, capillary     Status: Abnormal   Collection Time: 11/30/21  4:39 PM  Result Value Ref Range   Glucose-Capillary 173 (H) 70 - 99 mg/dL    Comment: Glucose reference  range applies only to samples taken after fasting for at least 8 hours.  Glucose, capillary     Status: Abnormal   Collection Time: 11/30/21  6:23 PM  Result Value Ref Range   Glucose-Capillary 172 (H) 70 - 99 mg/dL    Comment: Glucose reference range applies only to samples taken after fasting for at least 8 hours.  Glucose, capillary     Status: Abnormal   Collection Time: 11/30/21  9:16 PM  Result  Value Ref Range   Glucose-Capillary 135 (H) 70 - 99 mg/dL    Comment: Glucose reference range applies only to samples taken after fasting for at least 8 hours.  CBC     Status: Abnormal   Collection Time: 12/01/21  4:11 AM  Result Value Ref Range   WBC 13.5 (H) 4.0 - 10.5 K/uL   RBC 4.46 4.22 - 5.81 MIL/uL   Hemoglobin 13.8 13.0 - 17.0 g/dL   HCT 40.4 39.0 - 52.0 %   MCV 90.6 80.0 - 100.0 fL   MCH 30.9 26.0 - 34.0 pg   MCHC 34.2 30.0 - 36.0 g/dL   RDW 12.5 11.5 - 15.5 %   Platelets 172 150 - 400 K/uL   nRBC 0.0 0.0 - 0.2 %    Comment: Performed at [facility], [address] [place], [place] [address]  Basic metabolic panel     Status: Abnormal   Collection Time: 12/01/21  4:11 AM  Result Value Ref Range   Sodium 138 135 - 145 mmol/L   Potassium 4.7 3.5 - 5.1 mmol/L   Chloride 107 98 - 111 mmol/L   CO2 25 22 - 32 mmol/L   Glucose, Bld 189 (H) 70 - 99 mg/dL    Comment: Glucose reference range applies only to samples taken after fasting for at least 8 hours.   BUN 17 8 - 23 mg/dL   Creatinine, Ser 0.66 0.61 - 1.24 mg/dL   Calcium 8.0 (L) 8.9 - 10.3 mg/dL   GFR, Estimated >60 >60 mL/min    Comment: (NOTE) Calculated using the CKD-EPI Creatinine Equation (2021)    Anion gap 6 5 - 15    Comment: Performed at [facility], [address] [place], [place] [address]  Glucose, capillary     Status: Abnormal   Collection Time: 12/01/21  7:36 AM  Result Value Ref Range   Glucose-Capillary 177 (H) 70 - 99 mg/dL    Comment: Glucose reference range applies only to samples taken after fasting for at least 8 hours.   Comment 1 Notify RN   Glucose, capillary     Status: Abnormal   Collection Time: 12/01/21 11:52 AM  Result Value Ref Range   Glucose-Capillary 168 (H) 70 - 99 mg/dL    Comment: Glucose reference range applies only to samples taken after fasting for at least 8 hours.   Comment 1 Notify RN   Glucose, capillary     Status: Abnormal   Collection Time:  12/01/21  3:37 PM  Result Value Ref Range   Glucose-Capillary 129 (H) 70 - 99 mg/dL    Comment: Glucose reference range applies only to samples taken after fasting for at least 8 hours.   Comment 1 Notify RN   Glucose, capillary     Status: Abnormal   Collection Time: 12/01/21  8:46 PM  Result Value Ref Range   Glucose-Capillary 141 (H) 70 - 99 mg/dL    Comment: Glucose reference range applies only to samples taken  after fasting for at least 8 hours.  CBC     Status: Abnormal   Collection Time: 12/02/21  5:41 AM  Result Value Ref Range   WBC 8.2 4.0 - 10.5 K/uL   RBC 3.35 (L) 4.22 - 5.81 MIL/uL   Hemoglobin 10.5 (L) 13.0 - 17.0 g/dL    Comment: REPEATED TO VERIFY   HCT 31.5 (L) 39.0 - 52.0 %   MCV 94.0 80.0 - 100.0 fL   MCH 31.3 26.0 - 34.0 pg   MCHC 33.3 30.0 - 36.0 g/dL   RDW 13.0 11.5 - 15.5 %   Platelets 136 (L) 150 - 400 K/uL   nRBC 0.0 0.0 - 0.2 %    Comment: Performed at [facility], [address] [place], [place] [address]  Glucose, capillary     Status: Abnormal   Collection Time: 12/02/21  8:03 AM  Result Value Ref Range   Glucose-Capillary 150 (H) 70 - 99 mg/dL    Comment: Glucose reference range applies only to samples taken after fasting for at least 8 hours.   Comment 1 Notify RN   Basic metabolic panel     Status: Abnormal   Collection Time: 12/02/21  8:32 AM  Result Value Ref Range   Sodium 141 135 - 145 mmol/L   Potassium 3.8 3.5 - 5.1 mmol/L   Chloride 110 98 - 111 mmol/L   CO2 28 22 - 32 mmol/L   Glucose, Bld 134 (H) 70 - 99 mg/dL    Comment: Glucose reference range applies only to samples taken after fasting for at least 8 hours.   BUN 18 8 - 23 mg/dL   Creatinine, Ser 0.57 (L) 0.61 - 1.24 mg/dL   Calcium 8.0 (L) 8.9 - 10.3 mg/dL   GFR, Estimated >60 >60 mL/min    Comment: (NOTE) Calculated using the CKD-EPI Creatinine Equation (2021)    Anion gap 3 (L) 5 - 15    Comment: Performed at [facility], [address]  [place], [place] [address]  Magnesium     Status: None   Collection Time: 12/02/21  8:32 AM  Result Value Ref Range   Magnesium 2.1 1.7 - 2.4 mg/dL    Comment: Performed at [facility], [address], [place], [place] [address]  Phosphorus     Status: Abnormal   Collection Time: 12/02/21  8:32 AM  Result Value Ref Range   Phosphorus 2.3 (L) 2.5 - 4.6 mg/dL    Comment: Performed at [facility], [address] [place], [place] [address]  Glucose, capillary     Status: Abnormal   Collection Time: 12/02/21 12:06 PM  Result Value Ref Range   Glucose-Capillary 106 (H) 70 - 99 mg/dL    Comment: Glucose reference range applies only to samples taken after fasting for at least 8 hours.   Comment 1 Notify RN   Glucose, capillary     Status: Abnormal   Collection Time: 12/02/21  4:23 PM  Result Value Ref Range   Glucose-Capillary 101 (H) 70 - 99 mg/dL    Comment: Glucose reference range applies only to samples taken after fasting for at least 8 hours.   Comment 1 Notify RN   Glucose, capillary     Status: Abnormal   Collection Time: 12/02/21  8:28 PM  Result Value Ref Range   Glucose-Capillary 133 (H) 70 - 99 mg/dL    Comment: Glucose reference range applies only to samples taken after fasting for at  least 8 hours.  Glucose, capillary     Status: Abnormal   Collection Time: 12/02/21 10:07 PM  Result Value Ref Range   Glucose-Capillary 130 (H) 70 - 99 mg/dL    Comment: Glucose reference range applies only to samples taken after fasting for at least 8 hours.  Glucose, capillary     Status: Abnormal   Collection Time: 12/02/21 11:32 PM  Result Value Ref Range   Glucose-Capillary 119 (H) 70 - 99 mg/dL    Comment: Glucose reference range applies only to samples taken after fasting for at least 8 hours.  Glucose, capillary     Status: Abnormal   Collection Time: 12/03/21  3:39 AM  Result Value Ref Range   Glucose-Capillary 136 (H) 70 - 99 mg/dL    Comment: Glucose reference  range applies only to samples taken after fasting for at least 8 hours.  CBC     Status: Abnormal   Collection Time: 12/03/21  5:00 AM  Result Value Ref Range   WBC 7.1 4.0 - 10.5 K/uL   RBC 3.59 (L) 4.22 - 5.81 MIL/uL   Hemoglobin 11.0 (L) 13.0 - 17.0 g/dL   HCT 33.6 (L) 39.0 - 52.0 %   MCV 93.6 80.0 - 100.0 fL   MCH 30.6 26.0 - 34.0 pg   MCHC 32.7 30.0 - 36.0 g/dL   RDW 13.0 11.5 - 15.5 %   Platelets 144 (L) 150 - 400 K/uL   nRBC 0.0 0.0 - 0.2 %    Comment: Performed at [facility], [address] [place], [place] [address]  Basic metabolic panel     Status: Abnormal   Collection Time: 12/03/21  5:00 AM  Result Value Ref Range   Sodium 142 135 - 145 mmol/L   Potassium 3.6 3.5 - 5.1 mmol/L   Chloride 111 98 - 111 mmol/L   CO2 28 22 - 32 mmol/L   Glucose, Bld 127 (H) 70 - 99 mg/dL    Comment: Glucose reference range applies only to samples taken after fasting for at least 8 hours.   BUN 20 8 - 23 mg/dL   Creatinine, Ser 0.52 (L) 0.61 - 1.24 mg/dL   Calcium 8.2 (L) 8.9 - 10.3 mg/dL   GFR, Estimated >60 >60 mL/min    Comment: (NOTE) Calculated using the CKD-EPI Creatinine Equation (2021)    Anion gap 3 (L) 5 - 15    Comment: Performed at [facility], [address] [place], [place] [address]  Triglycerides     Status: None   Collection Time: 12/03/21  5:00 AM  Result Value Ref Range   Triglycerides 91 <150 mg/dL    Comment: Performed at [facility], [address] [place], [place] [address]  Magnesium     Status: None   Collection Time: 12/03/21  5:00 AM  Result Value Ref Range   Magnesium 2.1 1.7 - 2.4 mg/dL    Comment: Performed at [facility], [address] [place], [place] [address]  Phosphorus     Status: None   Collection Time: 12/03/21  5:00 AM  Result Value Ref Range   Phosphorus 3.1 2.5 - 4.6 mg/dL    Comment: Performed at [facility], [address] [place], [place] [address]  Glucose, capillary      Status: Abnormal   Collection Time: 12/03/21  7:37 AM  Result Value Ref Range   Glucose-Capillary 124 (H) 70 - 99 mg/dL    Comment: Glucose reference  range applies only to samples taken after fasting for at least 8 hours.  Glucose, capillary     Status: Abnormal   Collection Time: 12/03/21 11:21 AM  Result Value Ref Range   Glucose-Capillary 104 (H) 70 - 99 mg/dL    Comment: Glucose reference range applies only to samples taken after fasting for at least 8 hours.  Glucose, capillary     Status: Abnormal   Collection Time: 12/03/21  4:27 PM  Result Value Ref Range   Glucose-Capillary 118 (H) 70 - 99 mg/dL    Comment: Glucose reference range applies only to samples taken after fasting for at least 8 hours.  Glucose, capillary     Status: Abnormal   Collection Time: 12/03/21  7:33 PM  Result Value Ref Range   Glucose-Capillary 138 (H) 70 - 99 mg/dL    Comment: Glucose reference range applies only to samples taken after fasting for at least 8 hours.   Comment 1 Notify RN   Glucose, capillary     Status: Abnormal   Collection Time: 12/03/21  9:46 PM  Result Value Ref Range   Glucose-Capillary 111 (H) 70 - 99 mg/dL    Comment: Glucose reference range applies only to samples taken after fasting for at least 8 hours.  Glucose, capillary     Status: Abnormal   Collection Time: 12/03/21 11:52 PM  Result Value Ref Range   Glucose-Capillary 132 (H) 70 - 99 mg/dL    Comment: Glucose reference range applies only to samples taken after fasting for at least 8 hours.  Glucose, capillary     Status: Abnormal   Collection Time: 12/04/21  3:59 AM  Result Value Ref Range   Glucose-Capillary 125 (H) 70 - 99 mg/dL    Comment: Glucose reference range applies only to samples taken after fasting for at least 8 hours.   Comment 1 Notify RN   CBC     Status: Abnormal   Collection Time: 12/04/21  4:38 AM  Result Value Ref Range   WBC 5.3 4.0 - 10.5 K/uL   RBC 3.59 (L) 4.22 - 5.81 MIL/uL   Hemoglobin  11.0 (L) 13.0 - 17.0 g/dL   HCT 34.0 (L) 39.0 - 52.0 %   MCV 94.7 80.0 - 100.0 fL   MCH 30.6 26.0 - 34.0 pg   MCHC 32.4 30.0 - 36.0 g/dL   RDW 13.0 11.5 - 15.5 %   Platelets 146 (L) 150 - 400 K/uL   nRBC 0.0 0.0 - 0.2 %    Comment: Performed at [facility], [address] [place], [place] [address]  Comprehensive metabolic panel     Status: Abnormal   Collection Time: 12/04/21  4:38 AM  Result Value Ref Range   Sodium 142 135 - 145 mmol/L   Potassium 3.8 3.5 - 5.1 mmol/L   Chloride 112 (H) 98 - 111 mmol/L   CO2 27 22 - 32 mmol/L   Glucose, Bld 136 (H) 70 - 99 mg/dL    Comment: Glucose reference range applies only to samples taken after fasting for at least 8 hours.   BUN 20 8 - 23 mg/dL   Creatinine, Ser 0.50 (L) 0.61 - 1.24 mg/dL   Calcium 8.1 (L) 8.9 - 10.3 mg/dL   Total Protein 5.0 (L) 6.5 - 8.1 g/dL   Albumin 2.8 (L) 3.5 - 5.0 g/dL   AST 33 15 - 41 U/L   ALT 27 0 - 44 U/L   Alkaline Phosphatase 60 38 -  126 U/L   Total Bilirubin 0.4 0.3 - 1.2 mg/dL   GFR, Estimated >60 >60 mL/min    Comment: (NOTE) Calculated using the CKD-EPI Creatinine Equation (2021)    Anion gap 3 (L) 5 - 15    Comment: Performed at [facility], [address], [place], [place] [address]  Magnesium     Status: None   Collection Time: 12/04/21  4:38 AM  Result Value Ref Range   Magnesium 1.9 1.7 - 2.4 mg/dL    Comment: Performed at [facility], [address], [place], [place] [address]  Phosphorus     Status: None   Collection Time: 12/04/21  4:38 AM  Result Value Ref Range   Phosphorus 3.7 2.5 - 4.6 mg/dL    Comment: Performed at [facility], [address] [place], [place] [address]  Triglycerides     Status: None   Collection Time: 12/04/21  4:38 AM  Result Value Ref Range   Triglycerides 115 <150 mg/dL    Comment: Performed at [facility], [address] [place], [place] [address]  Glucose, capillary     Status: Abnormal   Collection  Time: 12/04/21  8:28 AM  Result Value Ref Range   Glucose-Capillary 136 (H) 70 - 99 mg/dL    Comment: Glucose reference range applies only to samples taken after fasting for at least 8 hours.  Glucose, capillary     Status: Abnormal   Collection Time: 12/04/21 11:35 AM  Result Value Ref Range   Glucose-Capillary 144 (H) 70 - 99 mg/dL    Comment: Glucose reference range applies only to samples taken after fasting for at least 8 hours.  Glucose, capillary     Status: Abnormal   Collection Time: 12/04/21  5:27 PM  Result Value Ref Range   Glucose-Capillary 124 (H) 70 - 99 mg/dL    Comment: Glucose reference range applies only to samples taken after fasting for at least 8 hours.  Glucose, capillary     Status: Abnormal   Collection Time: 12/04/21  9:19 PM  Result Value Ref Range   Glucose-Capillary 136 (H) 70 - 99 mg/dL    Comment: Glucose reference range applies only to samples taken after fasting for at least 8 hours.  Glucose, capillary     Status: Abnormal   Collection Time: 12/05/21 12:04 AM  Result Value Ref Range   Glucose-Capillary 155 (H) 70 - 99 mg/dL    Comment: Glucose reference range applies only to samples taken after fasting for at least 8 hours.  Glucose, capillary     Status: Abnormal   Collection Time: 12/05/21  3:41 AM  Result Value Ref Range   Glucose-Capillary 144 (H) 70 - 99 mg/dL    Comment: Glucose reference range applies only to samples taken after fasting for at least 8 hours.   Comment 1 Notify RN   Glucose, capillary     Status: Abnormal   Collection Time: 12/05/21  7:54 AM  Result Value Ref Range   Glucose-Capillary 147 (H) 70 - 99 mg/dL    Comment: Glucose reference range applies only to samples taken after fasting for at least 8 hours.  Glucose, capillary     Status: Abnormal   Collection Time: 12/05/21 12:02 PM  Result Value Ref Range   Glucose-Capillary 148 (H) 70 - 99 mg/dL    Comment: Glucose reference range applies only to samples taken after  fasting for at least 8 hours.  Glucose, capillary  Status: Abnormal   Collection Time: 12/05/21  5:10 PM  Result Value Ref Range   Glucose-Capillary 138 (H) 70 - 99 mg/dL    Comment: Glucose reference range applies only to samples taken after fasting for at least 8 hours.  Glucose, capillary     Status: Abnormal   Collection Time: 12/05/21  8:05 PM  Result Value Ref Range   Glucose-Capillary 111 (H) 70 - 99 mg/dL    Comment: Glucose reference range applies only to samples taken after fasting for at least 8 hours.  Glucose, capillary     Status: Abnormal   Collection Time: 12/06/21  1:38 AM  Result Value Ref Range   Glucose-Capillary 141 (H) 70 - 99 mg/dL    Comment: Glucose reference range applies only to samples taken after fasting for at least 8 hours.  Glucose, capillary     Status: Abnormal   Collection Time: 12/06/21  5:20 AM  Result Value Ref Range   Glucose-Capillary 127 (H) 70 - 99 mg/dL    Comment: Glucose reference range applies only to samples taken after fasting for at least 8 hours.  Glucose, capillary     Status: Abnormal   Collection Time: 12/06/21  8:41 AM  Result Value Ref Range   Glucose-Capillary 118 (H) 70 - 99 mg/dL    Comment: Glucose reference range applies only to samples taken after fasting for at least 8 hours.  Glucose, capillary     Status: Abnormal   Collection Time: 12/06/21 11:56 AM  Result Value Ref Range   Glucose-Capillary 160 (H) 70 - 99 mg/dL    Comment: Glucose reference range applies only to samples taken after fasting for at least 8 hours.     PHQ2/9:    08/09/2021    8:10 AM 03/02/2021   10:37 AM 01/20/2021    9:46 AM 01/18/2020   10:32 AM 01/14/2019   10:05 AM  Depression screen PHQ 2/9  Decreased Interest 0 0 0 0 0  Down, Depressed, Hopeless 0 0 0 0 0  PHQ - 2 Score 0 0 0 0 0  Altered sleeping 0      Tired, decreased energy 0      Change in appetite 0      Feeling bad or failure about yourself  0      Trouble concentrating 0       Moving slowly or fidgety/restless 0      Suicidal thoughts 0      PHQ-9 Score 0      Difficult doing work/chores Not difficult at all          Fall Risk:    08/09/2021    8:10 AM 03/02/2021   10:37 AM 01/20/2021    9:46 AM 01/18/2020   10:32 AM 01/14/2019   10:05 AM  Fall Risk   Falls in the past year? 0 0 0 0 0  Number falls in past yr: 0 0     Injury with Fall? 0 0     Risk for fall due to : No Fall Risks  Medication side effect Medication side effect   Follow up Falls evaluation completed  Falls evaluation completed;Education provided;Falls prevention discussed Falls evaluation completed;Education provided;Falls prevention discussed       Functional Status Survey:      Assessment & Plan  Problem List Items Addressed This Visit       Digestive   Small bowel obstruction ([place])    Appears resolved at this time Patient  underwent laparotomy with adhesion lysis during hospitalization- reports significant improvement in symptoms States he is tolerating  PO intake with progression to normal diet  at this time. Denies nausea, vomiting,  Reports he is having regular bowel movements  concern today is redness and scant drainage at bottom of surgical incision as well as localized edema in bilateral ankles/ feet See assessment and plan of these concerns for management         Relevant Orders   Comp Met (CMET)   CBC w/Diff   Other Visit Diagnoses     Hospital discharge follow-up    -  Primary Patient was recently discharged following SBO s/p laparotomy with adhesion  lysis and ventral hernia repair States symptoms have improved following surgery and he is tolerating PO intake without issue Will check CMP, CBC to ensure anemia and electrolyte derangements are correcting  Results to dictate further management  Confirmed with patient that he has appropriate follow up with Gen surgery for evaluation.     Relevant Orders   Comp Met (CMET)   CBC w/Diff   Superficial incisional  surgical site infection     Acute, new concern Pt reports he has been keeping incision clean with warm water and gentle soap as directed by surgical team but has noted redness and tension along the bottom portion of incision site PE reveals what appears to be superficial redness along with scant serous drainage near umbilicus - see photos in PE for reference Pt appears to have well conrolled DM (last A1c is 5.6 in March 2023) but blood glucose appears elevated on hospital bloodwork- mildly concerned for impaired wound healing.  Will start Keflex 500 mg PO QID x 7 days to assist with resolution Recommend patient reach out to Surgical office if he notices increased redness, drainage, pain or fevers prior to follow up next week.    Relevant Medications   cephALEXin (KEFLEX) 500 MG capsule   Edema of both lower extremities     Acute, new concern Patient reports swelling in ankles and feet since he returned home from hospital despite trying to elevate legs  Will provide compression stocking recommendations (15-25 mmHg compression force) to be worn starting in the AM and taken off at night.  Will also provide 3 day Lasix 20 mg PO QD trial to assist with compression garments.  Follow up as needed.      Relevant Medications   furosemide (LASIX) 20 MG tablet        No follow-ups on file.   I, Jaxin Fulfer E Donyel Nester, PA-C, have reviewed all documentation for this visit. The documentation on 12/11/21 for the exam, diagnosis, procedures, and orders are all accurate and complete.   Talitha Givens, MHS, PA-C [facility]

## 2021-12-11 NOTE — Telephone Encounter (Signed)
Transition Care Management Unsuccessful Follow-up Telephone Call   Date of discharge and from where:  [facility] 6-28  Attempts:  2nd Attempt  Reason for unsuccessful TCM follow-up call:  patient is at hospital follow up now

## 2021-12-11 NOTE — Assessment & Plan Note (Signed)
Appears resolved at this time Patient underwent laparotomy with adhesion lysis during hospitalization- reports significant improvement in symptoms States he is tolerating  PO intake with progression to normal diet  at this time. Denies nausea, vomiting,  Reports he is having regular bowel movements  concern today is redness and scant drainage at bottom of surgical incision as well as localized edema in bilateral ankles/ feet See assessment and plan of these concerns for management

## 2021-12-12 ENCOUNTER — Other Ambulatory Visit: Payer: Self-pay | Admitting: Physician Assistant

## 2021-12-12 DIAGNOSIS — R600 Localized edema: Secondary | ICD-10-CM

## 2021-12-12 LAB — COMPREHENSIVE METABOLIC PANEL
ALT: 31 IU/L (ref 0–44)
AST: 24 IU/L (ref 0–40)
Albumin/Globulin Ratio: 1.5 (ref 1.2–2.2)
Albumin: 3.6 g/dL — ABNORMAL LOW (ref 3.7–4.7)
Alkaline Phosphatase: 135 IU/L — ABNORMAL HIGH (ref 44–121)
BUN/Creatinine Ratio: 14 (ref 10–24)
BUN: 10 mg/dL (ref 8–27)
Bilirubin Total: 0.3 mg/dL (ref 0.0–1.2)
CO2: 24 mmol/L (ref 20–29)
Calcium: 8.8 mg/dL (ref 8.6–10.2)
Chloride: 102 mmol/L (ref 96–106)
Creatinine, Ser: 0.74 mg/dL — ABNORMAL LOW (ref 0.76–1.27)
Globulin, Total: 2.4 g/dL (ref 1.5–4.5)
Glucose: 113 mg/dL — ABNORMAL HIGH (ref 70–99)
Potassium: 4.9 mmol/L (ref 3.5–5.2)
Sodium: 140 mmol/L (ref 134–144)
Total Protein: 6 g/dL (ref 6.0–8.5)
eGFR: 96 mL/min/{1.73_m2} (ref >59)

## 2021-12-12 LAB — CBC WITH DIFFERENTIAL/PLATELET
Basophils Absolute: 0 10*3/uL (ref 0.0–0.2)
Basos: 1 %
EOS (ABSOLUTE): 0.2 10*3/uL (ref 0.0–0.4)
Eos: 2 %
Hematocrit: 36.4 % — ABNORMAL LOW (ref 37.5–51.0)
Hemoglobin: 12.4 g/dL — ABNORMAL LOW (ref 13.0–17.7)
Immature Grans (Abs): 0 10*3/uL (ref 0.0–0.1)
Immature Granulocytes: 0 %
Lymphocytes Absolute: 1.3 10*3/uL (ref 0.7–3.1)
Lymphs: 17 %
MCH: 31.5 pg (ref 26.6–33.0)
MCHC: 34.1 g/dL (ref 31.5–35.7)
MCV: 92 fL (ref 79–97)
Monocytes Absolute: 0.9 10*3/uL (ref 0.1–0.9)
Monocytes: 12 %
Neutrophils Absolute: 5.2 10*3/uL (ref 1.4–7.0)
Neutrophils: 68 %
Platelets: 324 10*3/uL (ref 150–450)
RBC: 3.94 x10E6/uL — ABNORMAL LOW (ref 4.14–5.80)
RDW: 12.4 % (ref 11.6–15.4)
WBC: 7.5 10*3/uL (ref 3.4–10.8)

## 2021-12-13 ENCOUNTER — Other Ambulatory Visit: Payer: Self-pay | Admitting: Physician Assistant

## 2021-12-13 DIAGNOSIS — R600 Localized edema: Secondary | ICD-10-CM

## 2021-12-13 NOTE — Telephone Encounter (Signed)
Requested medication (s) are due for refill today: yes  Requested medication (s) are on the active medication list: yes  Last refill:  12/11/21  Future visit scheduled: yes  Notes to clinic:  Unable to refill per protocol, routing for approval. Pt was on a short supply, unsure if he needs to continue taking medication.     Requested Prescriptions  Pending Prescriptions Disp Refills   furosemide (LASIX) 20 MG tablet [Pharmacy Med Name: FUROSEMIDE '20MG'$  TABLETS] 3 tablet 0    Sig: TAKE 1 TABLET BY MOUTH EVERY DAY     Cardiovascular:  Diuretics - Loop Failed - 12/12/2021  3:36 AM      Failed - Ca in normal range and within 180 days    Calcium  Date Value Ref Range Status  12/11/2021 8.8 8.6 - 10.2 mg/dL Final         Failed - Cr in normal range and within 180 days    Creatinine, Ser  Date Value Ref Range Status  12/11/2021 0.74 (L) 0.76 - 1.27 mg/dL Final         Failed - Cl in normal range and within 180 days    Chloride  Date Value Ref Range Status  12/11/2021 102 96 - 106 mmol/L Final         Failed - Last BP in normal range    BP Readings from Last 1 Encounters:  12/11/21 140/74         Passed - K in normal range and within 180 days    Potassium  Date Value Ref Range Status  12/11/2021 4.9 3.5 - 5.2 mmol/L Final         Passed - Na in normal range and within 180 days    Sodium  Date Value Ref Range Status  12/11/2021 140 134 - 144 mmol/L Final         Passed - Mg Level in normal range and within 180 days    Magnesium  Date Value Ref Range Status  12/04/2021 1.9 1.7 - 2.4 mg/dL Final    Comment:    Performed at [facility], [address] [place], [place] [address]         Passed - Valid encounter within last 6 months    Recent Outpatient Visits           2 days ago Hospital discharge follow-up   [place], Dani Gobble, PA-C   4 months ago Annual physical exam   [facility] Jon Billings, NP   9 months ago  Right calf pain   [facility] McElwee, Lauren A, NP   10 months ago Diabetes mellitus associated with hormonal etiology ([place])   [place] Jon Billings, NP   1 year ago Annual physical exam   [facility] Jon Billings, NP       Future Appointments             In 1 month  [place], [place]   In 1 month Jon Billings, NP [employer], [place]

## 2021-12-13 NOTE — Telephone Encounter (Signed)
Requested medication (s) are due for refill today:   Requested medication (s) are on the active medication list: Yes  Last refill:  12/11/21  Future visit scheduled:   Notes to clinic:  Request has come through again.    Requested Prescriptions  Pending Prescriptions Disp Refills   furosemide (LASIX) 20 MG tablet [Pharmacy Med Name: FUROSEMIDE '20MG'$  TABLETS] 3 tablet 0    Sig: TAKE 1 TABLET BY MOUTH EVERY DAY     Cardiovascular:  Diuretics - Loop Failed - 12/13/2021  3:35 AM      Failed - Cr in normal range and within 180 days    Creatinine, Ser  Date Value Ref Range Status  12/11/2021 0.74 (L) 0.76 - 1.27 mg/dL Final         Failed - Last BP in normal range    BP Readings from Last 1 Encounters:  12/11/21 140/74         Passed - K in normal range and within 180 days    Potassium  Date Value Ref Range Status  12/11/2021 4.9 3.5 - 5.2 mmol/L Final         Passed - Ca in normal range and within 180 days    Calcium  Date Value Ref Range Status  12/11/2021 8.8 8.6 - 10.2 mg/dL Final         Passed - Na in normal range and within 180 days    Sodium  Date Value Ref Range Status  12/11/2021 140 134 - 144 mmol/L Final         Passed - Cl in normal range and within 180 days    Chloride  Date Value Ref Range Status  12/11/2021 102 96 - 106 mmol/L Final         Passed - Mg Level in normal range and within 180 days    Magnesium  Date Value Ref Range Status  12/04/2021 1.9 1.7 - 2.4 mg/dL Final    Comment:    Performed at [facility], [address] [place], [place] [address]         Passed - Valid encounter within last 6 months    Recent Outpatient Visits           2 days ago Hospital discharge follow-up   [place], Dani Gobble, PA-C   4 months ago Annual physical exam   [facility] Jon Billings, NP   9 months ago Right calf pain   [facility] McElwee, Lauren A, NP   10 months ago Diabetes mellitus  associated with hormonal etiology ([place])   [place] Jon Billings, NP   1 year ago Annual physical exam   [facility] Jon Billings, NP       Future Appointments             In 1 month  [place], [place]   In 1 month Jon Billings, NP [employer], [place]

## 2021-12-14 ENCOUNTER — Other Ambulatory Visit: Payer: Self-pay | Admitting: Physician Assistant

## 2021-12-14 ENCOUNTER — Telehealth: Payer: Self-pay

## 2021-12-14 DIAGNOSIS — R600 Localized edema: Secondary | ICD-10-CM

## 2021-12-14 NOTE — Telephone Encounter (Signed)
Spoke with patient and informed him that he was only suppose to get 3 tablets. Patient verbalized understanding and also stated that he did get some compression stockings and is currently wearing them and keeping his feet elevated.

## 2021-12-14 NOTE — Telephone Encounter (Signed)
Note per E. Mecum, PA states patient only ordered 3 tablets no refills. Patient notified by T. Daveluy, CMA he was only ordered. 3 tablets.  Requested Prescriptions  Refused Prescriptions Disp Refills  . furosemide (LASIX) 20 MG tablet [Pharmacy Med Name: FUROSEMIDE '20MG'$  TABLETS] 3 tablet 0    Sig: TAKE 1 TABLET BY MOUTH EVERY DAY     Cardiovascular:  Diuretics - Loop Failed - 12/14/2021  7:58 AM      Failed - Cr in normal range and within 180 days    Creatinine, Ser  Date Value Ref Range Status  12/11/2021 0.74 (L) 0.76 - 1.27 mg/dL Final         Failed - Last BP in normal range    BP Readings from Last 1 Encounters:  12/11/21 140/74         Passed - K in normal range and within 180 days    Potassium  Date Value Ref Range Status  12/11/2021 4.9 3.5 - 5.2 mmol/L Final         Passed - Ca in normal range and within 180 days    Calcium  Date Value Ref Range Status  12/11/2021 8.8 8.6 - 10.2 mg/dL Final         Passed - Na in normal range and within 180 days    Sodium  Date Value Ref Range Status  12/11/2021 140 134 - 144 mmol/L Final         Passed - Cl in normal range and within 180 days    Chloride  Date Value Ref Range Status  12/11/2021 102 96 - 106 mmol/L Final         Passed - Mg Level in normal range and within 180 days    Magnesium  Date Value Ref Range Status  12/04/2021 1.9 1.7 - 2.4 mg/dL Final    Comment:    Performed at [facility], [address], [place], [place] [address]         Passed - Valid encounter within last 6 months    Recent Outpatient Visits          3 days ago Hospital discharge follow-up   [place], Dani Gobble, PA-C   4 months ago Annual physical exam   [facility] Jon Billings, NP   9 months ago Right calf pain   [facility] McElwee, Lauren A, NP   10 months ago Diabetes mellitus associated with hormonal etiology ([place])   [place] Jon Billings, NP   1  year ago Annual physical exam   [facility] Jon Billings, NP      Future Appointments            In 1 month  [place], [place]   In 1 month Jon Billings, NP [employer], [place]

## 2021-12-14 NOTE — Telephone Encounter (Signed)
Patient was only to receive 3 tablets of Lasix 20 mg since his edema is likely acute in nature from recent hospitalization. He is to use that to help relieve edema and then proceed with compression stockings and elevation for preventive measures.

## 2021-12-14 NOTE — Telephone Encounter (Signed)
Copied from [place] [phone]. Topic: General - Other >> Dec 13, 2021  3:21 PM Everette C wrote: Reason for CRM: The patient has been in contact with their pharmacy   The patient received their prescription for furosemide (LASIX) 20 MG tablet [[id number]]   The patient shares that they were under the impression that they would be prescribed the medication with a quaintly of 20 tablets   The patient has been told by their pharmacy that additional approval from [facility]. Mecum is required to receive their 17 additional tablets   Please contact the patient further when possible    Routing to provider to advise. Office visit note says 3 tablets.

## 2021-12-18 ENCOUNTER — Ambulatory Visit: Payer: Medicare Other | Admitting: Nurse Practitioner

## 2021-12-19 ENCOUNTER — Encounter: Payer: Self-pay | Admitting: Physician Assistant

## 2021-12-19 ENCOUNTER — Other Ambulatory Visit: Payer: Self-pay

## 2021-12-19 ENCOUNTER — Ambulatory Visit (INDEPENDENT_AMBULATORY_CARE_PROVIDER_SITE_OTHER): Payer: Medicare Other | Admitting: Physician Assistant

## 2021-12-19 VITALS — BP 115/69 | HR 86 | Temp 97.6°F | Ht 67.0 in | Wt 203.4 lb

## 2021-12-19 DIAGNOSIS — Z09 Encounter for follow-up examination after completed treatment for conditions other than malignant neoplasm: Secondary | ICD-10-CM

## 2021-12-19 DIAGNOSIS — K56609 Unspecified intestinal obstruction, unspecified as to partial versus complete obstruction: Secondary | ICD-10-CM

## 2021-12-19 NOTE — Progress Notes (Signed)
[facility] POST-OP OFFICE VISIT  12/19/2021  HPI: Randy Harper is a 71 y.o. male 19 days s/p exploratory laparotomy, lysis of adhesions, and ventral hernia repair with Dr Dahlia Byes   Overall doing well given the circumstances Essentially pain free; using intermittent tylenol PM for sleeping No fever, chills, nausea, emesis He is tolerating PO and having normal bowel function Did have some swelling post-operatively; this resolved with Lasix from PCP No other complaints   Vital signs: BP 115/69   Pulse 86   Temp 97.6 F (36.4 C) (Oral)   Ht '5\' 7"'$  (1.702 m)   Wt 203 lb 6.4 oz (92.3 kg)   SpO2 97%   BMI 31.86 kg/m    Physical Exam: Constitutional: Well appearing male, NAD Abdomen: Soft, non-tender, non-distended, no rebound/guarding Skin: Laparotomy is well healed, there is some erythema from the staples inferiorly, this does not look infection, no drainage   Assessment/Plan: This is a 71 y.o. male 19 days s/p exploratory laparotomy, lysis of adhesions, and ventral hernia repair   - Staples removed  - Pain control prn  - Reviewed wound care recommendation  - Reviewed lifting restrictions; 6 weeks total  - I will see him again in ~1 month; He understands to call with questions/concerns  -- Edison Simon, PA-C [facility] 12/19/2021, 3:07 PM M-F: 7am - 4pm

## 2021-12-19 NOTE — Patient Instructions (Addendum)
If you have any concerns or questions, please feel free to call our office. See follow up appointment below.  ? ? ?GENERAL POST-OPERATIVE ?PATIENT INSTRUCTIONS  ? ?WOUND CARE INSTRUCTIONS:  Keep a dry clean dressing on the wound if there is drainage. The initial bandage may be removed after 24 hours.  Once the wound has quit draining you may leave it open to air.  If clothing rubs against the wound or causes irritation and the wound is not draining you may cover it with a dry dressing during the daytime.  Try to keep the wound dry and avoid ointments on the wound unless directed to do so.  If the wound becomes bright red and painful or starts to drain infected material that is not clear, please contact your physician immediately.  If the wound is mildly pink and has a thick firm ridge underneath it, this is normal, and is referred to as a healing ridge.  This will resolve over the next 4-6 weeks. ? ?BATHING: ?You may shower if you have been informed of this by your surgeon. However, Please do not submerge in a tub, hot tub, or pool until incisions are completely sealed or have been told by your surgeon that you may do so. ? ?DIET:  You may eat any foods that you can tolerate.  It is a good idea to eat a high fiber diet and take in plenty of fluids to prevent constipation.  If you do become constipated you may want to take a mild laxative or take ducolax tablets on a daily basis until your bowel habits are regular.  Constipation can be very uncomfortable, along with straining, after recent surgery. ? ?ACTIVITY:  You are encouraged to cough and deep breath or use your incentive spirometer if you were given one, every 15-30 minutes when awake.  This will help prevent respiratory complications and low grade fevers post-operatively if you had a general anesthetic.  You may want to hug a pillow when coughing and sneezing to add additional support to the surgical area, if you had abdominal or chest surgery, which will  decrease pain during these times.  You are encouraged to walk and engage in light activity for the next two weeks.  You should not lift more than 20 pounds for 6 weeks total after surgery as it could put you at increased risk for complications.  Twenty pounds is roughly equivalent to a plastic bag of groceries. At that time- Listen to your body when lifting, if you have pain when lifting, stop and then try again in a few days. Soreness after doing exercises or activities of daily living is normal as you get back in to your normal routine. ? ?MEDICATIONS:  Try to take narcotic medications and anti-inflammatory medications, such as tylenol, ibuprofen, naprosyn, etc., with food.  This will minimize stomach upset from the medication.  Should you develop nausea and vomiting from the pain medication, or develop a rash, please discontinue the medication and contact your physician.  You should not drive, make important decisions, or operate machinery when taking narcotic pain medication. ? ?SUNBLOCK ?Use sun block to incision area over the next year if this area will be exposed to sun. This helps decrease scarring and will allow you avoid a permanent darkened area over your incision. ? ?QUESTIONS:  Please feel free to call our office if you have any questions, and we will be glad to assist you. [phone] ? ? ?

## 2021-12-23 ENCOUNTER — Other Ambulatory Visit: Payer: Self-pay | Admitting: Nurse Practitioner

## 2021-12-23 DIAGNOSIS — E782 Mixed hyperlipidemia: Secondary | ICD-10-CM

## 2021-12-23 DIAGNOSIS — I152 Hypertension secondary to endocrine disorders: Secondary | ICD-10-CM

## 2021-12-25 NOTE — Telephone Encounter (Signed)
Requested Prescriptions  Pending Prescriptions Disp Refills  . atorvastatin (LIPITOR) 80 MG tablet [Pharmacy Med Name: Atorvastatin Calcium 80 MG Oral Tablet] 100 tablet 0    Sig: TAKE 1 TABLET BY MOUTH DAILY     Cardiovascular:  Antilipid - Statins Failed - 12/23/2021  4:37 AM      Failed - Lipid Panel in normal range within the last 12 months    Cholesterol, Total  Date Value Ref Range Status  08/09/2021 162 100 - 199 mg/dL Final   Cholesterol Piccolo, Waived  Date Value Ref Range Status  01/14/2019 147 <200 mg/dL Final    Comment:                            Desirable                <200                         Borderline High      200- 239                         High                     >239    LDL Chol Calc (NIH)  Date Value Ref Range Status  08/09/2021 98 0 - 99 mg/dL Final   HDL  Date Value Ref Range Status  08/09/2021 44 >39 mg/dL Final   Triglycerides  Date Value Ref Range Status  12/04/2021 115 <150 mg/dL Final    Comment:    Performed at [facility], [address] [place], [place] [address]   Triglycerides Piccolo,Waived  Date Value Ref Range Status  01/14/2019 113 <150 mg/dL Final    Comment:                            Normal                   <150                         Borderline High     150 - 199                         High                200 - 499                         Very High                >499          Passed - Patient is not pregnant      Passed - Valid encounter within last 12 months    Recent Outpatient Visits          2 weeks ago Hospital discharge follow-up   [place], Dani Gobble, PA-C   4 months ago Annual physical exam   [place], NP   9 months ago Right calf pain   [place], NP   10 months ago Diabetes mellitus associated with hormonal etiology ([place])   [place], Karen, NP   1  year ago Annual physical  exam   [facility] Jon Billings, NP      Future Appointments            In 4 weeks  [facility], [place]   In 7 month Jon Billings, NP [facility], [facility]           . metFORMIN (GLUCOPHAGE) 500 MG tablet [Pharmacy Med Name: metFORMIN HCl 500 MG Oral Tablet] 100 tablet 2    Sig: TAKE 1 TABLET BY MOUTH DAILY  WITH BREAKFAST     Endocrinology:  Diabetes - Biguanides Failed - 12/23/2021  4:37 AM      Failed - Cr in normal range and within 360 days    Creatinine, Ser  Date Value Ref Range Status  12/11/2021 0.74 (L) 0.76 - 1.27 mg/dL Final         Failed - B12 Level in normal range and within 720 days    No results found for: "VITAMINB12"       Failed - CBC within normal limits and completed in the last 12 months    WBC  Date Value Ref Range Status  12/11/2021 7.5 3.4 - 10.8 x10E3/uL Final  12/04/2021 5.3 4.0 - 10.5 K/uL Final   RBC  Date Value Ref Range Status  12/11/2021 3.94 (L) 4.14 - 5.80 x10E6/uL Final  12/04/2021 3.59 (L) 4.22 - 5.81 MIL/uL Final   Hemoglobin  Date Value Ref Range Status  12/11/2021 12.4 (L) 13.0 - 17.7 g/dL Final   Hematocrit  Date Value Ref Range Status  12/11/2021 36.4 (L) 37.5 - 51.0 % Final   MCHC  Date Value Ref Range Status  12/11/2021 34.1 31.5 - 35.7 g/dL Final  12/04/2021 32.4 30.0 - 36.0 g/dL Final   [facility]  Date Value Ref Range Status  12/11/2021 31.5 26.6 - 33.0 pg Final  12/04/2021 30.6 26.0 - 34.0 pg Final   MCV  Date Value Ref Range Status  12/11/2021 92 79 - 97 fL Final   No results found for: "PLTCOUNTKUC", "LABPLAT", "POCPLA" RDW  Date Value Ref Range Status  12/11/2021 12.4 11.6 - 15.4 % Final         Passed - HBA1C is between 0 and 7.9 and within 180 days    Hemoglobin A1C  Date Value Ref Range Status  01/19/2016 6.3  Final   HB A1C (BAYER DCA - WAIVED)  Date Value Ref Range Status  01/14/2019 5.6 <7.0 % Final    Comment:                                           Diabetic Adult            <7.0                                       Healthy Adult        4.3 - 5.7                                                           (DCCT/NGSP) American Diabetes Association's Summary of Glycemic  Recommendations for Adults with Diabetes: Hemoglobin A1c <7.0%. More stringent glycemic goals (A1c <6.0%) may further reduce complications at the cost of increased risk of hypoglycemia.    Hgb A1c MFr Bld  Date Value Ref Range Status  08/09/2021 5.6 4.8 - 5.6 % Final    Comment:             Prediabetes: 5.7 - 6.4          Diabetes: >6.4          Glycemic control for adults with diabetes: <7.0          Passed - eGFR in normal range and within 360 days    GFR calc Af Amer  Date Value Ref Range Status  02/04/2020 110 >59 mL/min/1.73 Final    Comment:    **[facility] currently reports eGFR in compliance with the current**   recommendations of the [employer]. [facility] will   update reporting as new guidelines are published from the NKF-ASN   Task force.    GFR, Estimated  Date Value Ref Range Status  12/04/2021 >60 >60 mL/min Final    Comment:    (NOTE) Calculated using the CKD-EPI Creatinine Equation (2021)    eGFR  Date Value Ref Range Status  12/11/2021 96 >59 mL/min/1.73 Final         Passed - Valid encounter within last 6 months    Recent Outpatient Visits          2 weeks ago Hospital discharge follow-up   [place], PA-C   4 months ago Annual physical exam   [place], NP   9 months ago Right calf pain   [place], NP   10 months ago Diabetes mellitus associated with hormonal etiology ([place])   [place] Jon Billings, NP   1 year ago Annual physical exam   [facility] Jon Billings, NP      Future Appointments            In 4 weeks  [facility], [place]   In 1 month Jon Billings, NP  [employer], [place]

## 2022-01-16 DIAGNOSIS — M454 Ankylosing spondylitis of thoracic region: Secondary | ICD-10-CM | POA: Diagnosis not present

## 2022-01-16 DIAGNOSIS — M2578 Osteophyte, vertebrae: Secondary | ICD-10-CM | POA: Diagnosis not present

## 2022-01-16 DIAGNOSIS — M4802 Spinal stenosis, cervical region: Secondary | ICD-10-CM | POA: Diagnosis not present

## 2022-01-16 DIAGNOSIS — M5441 Lumbago with sciatica, right side: Secondary | ICD-10-CM | POA: Diagnosis not present

## 2022-01-16 DIAGNOSIS — M456 Ankylosing spondylitis lumbar region: Secondary | ICD-10-CM | POA: Diagnosis not present

## 2022-01-16 DIAGNOSIS — M5136 Other intervertebral disc degeneration, lumbar region: Secondary | ICD-10-CM | POA: Diagnosis not present

## 2022-01-16 DIAGNOSIS — M47812 Spondylosis without myelopathy or radiculopathy, cervical region: Secondary | ICD-10-CM | POA: Diagnosis not present

## 2022-01-16 DIAGNOSIS — M4602 Spinal enthesopathy, cervical region: Secondary | ICD-10-CM | POA: Diagnosis not present

## 2022-01-16 DIAGNOSIS — M545 Low back pain, unspecified: Secondary | ICD-10-CM | POA: Diagnosis not present

## 2022-01-16 DIAGNOSIS — G8929 Other chronic pain: Secondary | ICD-10-CM | POA: Diagnosis not present

## 2022-01-16 DIAGNOSIS — M533 Sacrococcygeal disorders, not elsewhere classified: Secondary | ICD-10-CM | POA: Diagnosis not present

## 2022-01-16 DIAGNOSIS — M4316 Spondylolisthesis, lumbar region: Secondary | ICD-10-CM | POA: Diagnosis not present

## 2022-01-16 DIAGNOSIS — Z7960 Long term (current) use of unspecified immunomodulators and immunosuppressants: Secondary | ICD-10-CM | POA: Diagnosis not present

## 2022-01-17 DIAGNOSIS — H353221 Exudative age-related macular degeneration, left eye, with active choroidal neovascularization: Secondary | ICD-10-CM | POA: Diagnosis not present

## 2022-01-17 DIAGNOSIS — H43821 Vitreomacular adhesion, right eye: Secondary | ICD-10-CM | POA: Diagnosis not present

## 2022-01-17 DIAGNOSIS — H43393 Other vitreous opacities, bilateral: Secondary | ICD-10-CM | POA: Diagnosis not present

## 2022-01-17 DIAGNOSIS — H43813 Vitreous degeneration, bilateral: Secondary | ICD-10-CM | POA: Diagnosis not present

## 2022-01-17 DIAGNOSIS — H353112 Nonexudative age-related macular degeneration, right eye, intermediate dry stage: Secondary | ICD-10-CM | POA: Diagnosis not present

## 2022-01-18 ENCOUNTER — Encounter: Payer: Self-pay | Admitting: Physician Assistant

## 2022-01-18 ENCOUNTER — Ambulatory Visit (INDEPENDENT_AMBULATORY_CARE_PROVIDER_SITE_OTHER): Payer: Medicare Other | Admitting: Physician Assistant

## 2022-01-18 VITALS — BP 144/70 | HR 80 | Temp 97.8°F | Wt 208.4 lb

## 2022-01-18 DIAGNOSIS — Z09 Encounter for follow-up examination after completed treatment for conditions other than malignant neoplasm: Secondary | ICD-10-CM

## 2022-01-18 DIAGNOSIS — K56609 Unspecified intestinal obstruction, unspecified as to partial versus complete obstruction: Secondary | ICD-10-CM

## 2022-01-18 NOTE — Patient Instructions (Signed)

## 2022-01-18 NOTE — Progress Notes (Signed)
[facility] POST-OP OFFICE VISIT  01/18/2022  HPI: Randy Harper is a 71 y.o. male ~6 weeks s/p exploratory laparotomy, lysis of adhesions, and ventral hernia repair with Dr Dahlia Byes   He has continued to do remarkably well No abdominal pain, fever, chills, nausea, emesis, or bowel changes Tolerating normal PO Incision is well healed No new complaints   Vital signs: BP (!) 144/70   Pulse 80   Temp 97.8 F (36.6 C) (Oral)   Wt 208 lb 6.4 oz (94.5 kg)   SpO2 97%   BMI 32.64 kg/m    Physical Exam: Constitutional: Well appearing male, NAD Abdomen: Soft, non-tender, non-distended, no rebound/guarding Skin: Laparotomy has healed well  Assessment/Plan: This is a 71 y.o. male ~6 weeks s/p exploratory laparotomy, lysis of adhesions, and ventral hernia repair with Dr Dahlia Byes    - Nothing further  - He has completed restrictions   - He can follow up on as needed basis; He understands to call with questions/concerns  -- Edison Simon, PA-C [facility] 01/18/2022, 1:53 PM M-F: 7am - 4pm

## 2022-01-22 ENCOUNTER — Ambulatory Visit (INDEPENDENT_AMBULATORY_CARE_PROVIDER_SITE_OTHER): Payer: Medicare Other | Admitting: *Deleted

## 2022-01-22 DIAGNOSIS — Z0000 Encounter for general adult medical examination without abnormal findings: Secondary | ICD-10-CM

## 2022-01-22 NOTE — Patient Instructions (Signed)
Randy Harper , Thank you for taking time to come for your Medicare Wellness Visit. I appreciate your ongoing commitment to your health goals. Please review the following plan we discussed and let me know if I can assist you in the future.   Screening recommendations/referrals: Colonoscopy: up to date Recommended yearly ophthalmology/optometry visit for glaucoma screening and checkup Recommended yearly dental visit for hygiene and checkup  Vaccinations: Influenza vaccine: up to date Pneumococcal vaccine: up to date Tdap vaccine: up to date Shingles vaccine: up to date    Advanced directives: Education provided  Conditions/risks identified:   Next appointment: 02-09-2022 @ 8:20 [facility] 73 Years and Older, Male Preventive care refers to lifestyle choices and visits with your health care provider that can promote health and wellness. What does preventive care include? A yearly physical exam. This is also called an annual well check. Dental exams once or twice a year. Routine eye exams. Ask your health care provider how often you should have your eyes checked. Personal lifestyle choices, including: Daily care of your teeth and gums. Regular physical activity. Eating a healthy diet. Avoiding tobacco and drug use. Limiting alcohol use. Practicing safe sex. Taking low doses of aspirin every day. Taking vitamin and mineral supplements as recommended by your health care provider. What happens during an annual well check? The services and screenings done by your health care provider during your annual well check will depend on your age, overall health, lifestyle risk factors, and family history of disease. Counseling  Your health care provider may ask you questions about your: Alcohol use. Tobacco use. Drug use. Emotional well-being. Home and relationship well-being. Sexual activity. Eating habits. History of falls. Memory and ability to understand (cognition). Work  and work Statistician. Screening  You may have the following tests or measurements: Height, weight, and BMI. Blood pressure. Lipid and cholesterol levels. These may be checked every 5 years, or more frequently if you are over 73 years Poli. Skin check. Lung cancer screening. You may have this screening every year starting at age 73 if you have a 30-pack-year history of smoking and currently smoke or have quit within the past 15 years. Fecal occult blood test (FOBT) of the stool. You may have this test every year starting at age 73. Flexible sigmoidoscopy or colonoscopy. You may have a sigmoidoscopy every 5 years or a colonoscopy every 10 years starting at age 73. Prostate cancer screening. Recommendations will vary depending on your family history and other risks. Hepatitis C blood test. Hepatitis B blood test. Sexually transmitted disease (STD) testing. Diabetes screening. This is done by checking your blood sugar (glucose) after you have not eaten for a while (fasting). You may have this done every 1-3 years. Abdominal aortic aneurysm (AAA) screening. You may need this if you are a current or former smoker. Osteoporosis. You may be screened starting at age 73 if you are at high risk. Talk with your health care provider about your test results, treatment options, and if necessary, the need for more tests. Vaccines  Your health care provider may recommend certain vaccines, such as: Influenza vaccine. This is recommended every year. Tetanus, diphtheria, and acellular pertussis (Tdap, Td) vaccine. You may need a Td booster every 10 years. Zoster vaccine. You may need this after age 21. Pneumococcal 13-valent conjugate (PCV13) vaccine. One dose is recommended after age 73. Pneumococcal polysaccharide (PPSV23) vaccine. One dose is recommended after age 73. Talk to your health care provider about which screenings and  vaccines you need and how often you need them. This information is not intended  to replace advice given to you by your health care provider. Make sure you discuss any questions you have with your health care provider. Document Released: 06/24/2015 Document Revised: 02/15/2016 Document Reviewed: 03/29/2015 Elsevier Interactive Patient Education  2017 [place] Prevention in the Home Falls can cause injuries. They can happen to people of all ages. There are many things you can do to make your home safe and to help prevent falls. What can I do on the outside of my home? Regularly fix the edges of walkways and driveways and fix any cracks. Remove anything that might make you trip as you walk through a door, such as a raised step or threshold. Trim any bushes or trees on the path to your home. Use bright outdoor lighting. Clear any walking paths of anything that might make someone trip, such as rocks or tools. Regularly check to see if handrails are loose or broken. Make sure that both sides of any steps have handrails. Any raised decks and porches should have guardrails on the edges. Have any leaves, snow, or ice cleared regularly. Use sand or salt on walking paths during winter. Clean up any spills in your garage right away. This includes oil or grease spills. What can I do in the bathroom? Use night lights. Install grab bars by the toilet and in the tub and shower. Do not use towel bars as grab bars. Use non-skid mats or decals in the tub or shower. If you need to sit down in the shower, use a plastic, non-slip stool. Keep the floor dry. Clean up any water that spills on the floor as soon as it happens. Remove soap buildup in the tub or shower regularly. Attach bath mats securely with double-sided non-slip rug tape. Do not have throw rugs and other things on the floor that can make you trip. What can I do in the bedroom? Use night lights. Make sure that you have a light by your bed that is easy to reach. Do not use any sheets or blankets that are too big  for your bed. They should not hang down onto the floor. Have a firm chair that has side arms. You can use this for support while you get dressed. Do not have throw rugs and other things on the floor that can make you trip. What can I do in the kitchen? Clean up any spills right away. Avoid walking on wet floors. Keep items that you use a lot in easy-to-reach places. If you need to reach something above you, use a strong step stool that has a grab bar. Keep electrical cords out of the way. Do not use floor polish or wax that makes floors slippery. If you must use wax, use non-skid floor wax. Do not have throw rugs and other things on the floor that can make you trip. What can I do with my stairs? Do not leave any items on the stairs. Make sure that there are handrails on both sides of the stairs and use them. Fix handrails that are broken or loose. Make sure that handrails are as long as the stairways. Check any carpeting to make sure that it is firmly attached to the stairs. Fix any carpet that is loose or worn. Avoid having throw rugs at the top or bottom of the stairs. If you do have throw rugs, attach them to the floor with carpet tape. Make  sure that you have a light switch at the top of the stairs and the bottom of the stairs. If you do not have them, ask someone to add them for you. What else can I do to help prevent falls? Wear shoes that: Do not have high heels. Have rubber bottoms. Are comfortable and fit you well. Are closed at the toe. Do not wear sandals. If you use a stepladder: Make sure that it is fully opened. Do not climb a closed stepladder. Make sure that both sides of the stepladder are locked into place. Ask someone to hold it for you, if possible. Clearly mark and make sure that you can see: Any grab bars or handrails. First and last steps. Where the edge of each step is. Use tools that help you move around (mobility aids) if they are needed. These  include: Canes. Walkers. Scooters. Crutches. Turn on the lights when you go into a dark area. Replace any light bulbs as soon as they burn out. Set up your furniture so you have a clear path. Avoid moving your furniture around. If any of your floors are uneven, fix them. If there are any pets around you, be aware of where they are. Review your medicines with your doctor. Some medicines can make you feel dizzy. This can increase your chance of falling. Ask your doctor what other things that you can do to help prevent falls. This information is not intended to replace advice given to you by your health care provider. Make sure you discuss any questions you have with your health care provider. Document Released: 03/24/2009 Document Revised: 11/03/2015 Document Reviewed: 07/02/2014 Elsevier Interactive Patient Education  2017 [employer].

## 2022-01-22 NOTE — Progress Notes (Signed)
Subjective:   Keaundre Thelin Chavana is a 71 y.o. male who presents for Medicare Annual/Subsequent preventive examination.  I connected with  Garnell Phenix Rhue on 01/22/22 by a telephone enabled telemedicine application and verified that I am speaking with the correct person using two identifiers.   I discussed the limitations of evaluation and management by telemedicine. The patient expressed understanding and agreed to proceed.  Patient location: home  Provider location:  Tele-health-home  r.   Review of Systems           Objective:    There were no vitals filed for this visit. There is no height or weight on file to calculate BMI.     11/30/2021    1:23 PM 11/28/2021    8:00 PM 08/22/2021    8:04 AM 06/06/2021   12:45 PM 06/05/2021    8:50 PM 01/20/2021    9:45 AM 01/18/2020   10:31 AM  Advanced Directives  Does Patient Have a Medical Advance Directive? Yes Yes Yes No No Yes Yes  Type of Paramedic of [place];Living will [place];Living will [place];Living will   [place];Living will [place];Living will  Does patient want to make changes to medical advance directive? No - Patient declined No - Patient declined       Copy of [place] in Chart? No - copy requested No - copy requested    No - copy requested No - copy requested    Current Medications (verified) Outpatient Encounter Medications as of 01/22/2022  Medication Sig   acetaminophen (TYLENOL) 500 MG tablet Take 500 mg by mouth every 6 (six) hours as needed for mild pain, moderate pain or headache.   Aflibercept 2 MG/0.05ML SOLN Apply to eye. Every 6 weeks injection   atorvastatin (LIPITOR) 80 MG tablet TAKE 1 TABLET BY MOUTH DAILY   brimonidine (ALPHAGAN) 0.2 % ophthalmic solution Place 1 drop into the left eye daily.    diltiazem (CARDIZEM CD) 240 MG 24 hr capsule Take 1 capsule (240 mg total) by  mouth daily.   metFORMIN (GLUCOPHAGE) 500 MG tablet TAKE 1 TABLET BY MOUTH DAILY  WITH BREAKFAST   Multiple Vitamin (MULTIVITAMIN) tablet Take 1 tablet by mouth daily.   Multiple Vitamins-Minerals (PRESERVISION AREDS 2+MULTI VIT PO) Take 1 tablet by mouth daily.   pantoprazole (PROTONIX) 40 MG tablet Take 1 tablet (40 mg total) by mouth daily for 28 days.   No facility-administered encounter medications on file as of 01/22/2022.    Allergies (verified) Patient has no known allergies.   History: Past Medical History:  Diagnosis Date   Ankylosing spondylitis ([place]) 11/09/2021   Blood transfusion without reported diagnosis 01/2017   Cancer ([place]) 01/2017   metastic neuroendocrine - liver   Diabetes mellitus without complication ([place])    Dysrhythmia    supraventricular Tach   Glaucoma 2015   Hyperlipidemia    Hypertension    Muscle strain of chest wall    PE (pulmonary thromboembolism) ([place])    Supraventricular tachycardia ([place])    Past Surgical History:  Procedure Laterality Date   CHOLECYSTECTOMY  01/2017   related to liver cancer   COLONOSCOPY     COLONOSCOPY WITH PROPOFOL N/A 03/17/2019   Procedure: COLONOSCOPY WITH PROPOFOL;  Surgeon: Lollie Sails, MD;  Location: [facility] ENDOSCOPY;  Service: Endoscopy;  Laterality: N/A;   ESOPHAGOGASTRODUODENOSCOPY N/A 06/06/2021   Procedure: ESOPHAGOGASTRODUODENOSCOPY (EGD);  Surgeon: Vonda Antigua  B, MD;  Location: [facility] ENDOSCOPY;  Service: Endoscopy;  Laterality: N/A;   ESOPHAGOGASTRODUODENOSCOPY (EGD) WITH PROPOFOL N/A 08/22/2021   Procedure: ESOPHAGOGASTRODUODENOSCOPY (EGD) WITH PROPOFOL;  Surgeon: Lesly Rubenstein, MD;  Location: [facility] ENDOSCOPY;  Service: Endoscopy;  Laterality: N/A;  DM   LAPAROSCOPY ABDOMEN DIAGNOSTIC     LAPAROTOMY N/A 11/30/2021   Procedure: EXPLORATORY LAPAROTOMY;  Surgeon: Jules Husbands, MD;  Location: [facility] ORS;  Service: General;  Laterality: N/A;   LYSIS OF ADHESION N/A 11/30/2021   Procedure: LYSIS OF  ADHESION;  Surgeon: Jules Husbands, MD;  Location: [facility] ORS;  Service: General;  Laterality: N/A;   resection liver total right lobe  12/2016   SMALL INTESTINE SURGERY  01/2017   cancer surgery   TENDON GRAFT Right    right thumb tendon graft at age 38    [place] N/A 11/30/2021   Procedure: HERNIA REPAIR VENTRAL ADULT;  Surgeon: Jules Husbands, MD;  Location: [facility] ORS;  Service: General;  Laterality: N/A;   Family History  Problem Relation Age of Onset   Heart disease Father    Diabetes Maternal Uncle    Diabetes Maternal Uncle    Diabetes Maternal Uncle    Diabetes Maternal Uncle    Diabetes Maternal Grandmother    Social History   Socioeconomic History   Marital status: Married    Spouse name: Not on file   Number of children: Not on file   Years of education: Not on file   Highest education level: Master's degree (e.g., MA, MS, MEng, MEd, MSW, MBA)  Occupational History   Occupation: retired  Tobacco Use   Smoking status: Never   Smokeless tobacco: Never  Vaping Use   Vaping Use: Never used  Substance and Sexual Activity   Alcohol use: Never   Drug use: No   Sexual activity: Yes    Birth control/protection: None  Other Topics Concern   Not on file  Social History Narrative   Not on file   Social Determinants of Health   Financial Resource Strain: Low Risk  (01/20/2021)   Overall Financial Resource Strain (CARDIA)    Difficulty of Paying Living Expenses: Not hard at all  Food Insecurity: No Food Insecurity (01/20/2021)   Hunger Vital Sign    Worried About Running Out of Food in the Last Year: Never true    [place] in the Last Year: Never true  Transportation Needs: No Transportation Needs (01/20/2021)   PRAPARE - Hydrologist (Medical): No    Lack of Transportation (Non-Medical): No  Physical Activity: Sufficiently Active (01/20/2021)   Exercise Vital Sign    Days of Exercise per Week: 6 days    Minutes of  Exercise per Session: 30 min  Stress: No Stress Concern Present (01/20/2021)   [place]    Feeling of Stress : Not at all  Social Connections: Moderately Integrated (12/23/2017)   Social Connection and Isolation Panel [NHANES]    Frequency of Communication with Friends and Family: More than three times a week    Frequency of Social Gatherings with Friends and Family: More than three times a week    Attends Religious Services: More than 4 times per year    Active Member of [employer] or Organizations: No    Attends Archivist Meetings: Never    Marital Status: Married    Tobacco Counseling Counseling given: Not Answered  Clinical Intake:                 Diabetic?  Yes  Nutrition Risk Assessment:  Has the patient had any N/V/D within the last 2 months?  No  Does the patient have any non-healing wounds?  No  Has the patient had any unintentional weight loss or weight gain?  No   Diabetes:  Is the patient diabetic?  Yes  If diabetic, was a CBG obtained today?  No  Did the patient bring in their glucometer from home?  No  How often do you monitor your CBG's? 1 a week.   Financial Strains and Diabetes Management:  Are you having any financial strains with the device, your supplies or your medication? No .  Does the patient want to be seen by Chronic Care Management for management of their diabetes?  No  Would the patient like to be referred to a Nutritionist or for Diabetic Management?  No   Diabetic Exams:  Diabetic Eye Exam: . Pt has been advised about the importance in completing this exam  Diabetic Foot Exam:  Pt has been advised about the importance in completing this exam.          Activities of Daily Living    11/28/2021    8:00 PM  In your present state of health, do you have any difficulty performing the following activities:  Hearing? 0  Vision? 0  Difficulty concentrating  or making decisions? 0  Walking or climbing stairs? 0  Dressing or bathing? 0  Doing errands, shopping? 0    Patient Care Team: Jon Billings, NP as PCP - General Anell Barr, OD (Optometry) Earnestine Leys, MD (Specialist) Barbette Merino, NP as Nurse Practitioner (Nurse Practitioner) Corey Skains, MD as Consulting Physician (Cardiology) Blanca Friend, MD as Referring Physician (Oncology)  Indicate any recent Medical Services you may have received from other than [facility] providers in the past year (date may be approximate).     Assessment:   This is a routine wellness examination for [facility]  Hearing/Vision screen No results found.  Dietary issues and exercise activities discussed:     Goals Addressed   None    Depression Screen    08/09/2021    8:10 AM 03/02/2021   10:37 AM 01/20/2021    9:46 AM 01/18/2020   10:32 AM 01/14/2019   10:05 AM 12/23/2017    9:40 AM 06/18/2017    3:23 PM  PHQ 2/9 Scores  PHQ - 2 Score 0 0 0 0 0 0 0  PHQ- 9 Score 0          Fall Risk    12/19/2021    2:28 PM 08/09/2021    8:10 AM 03/02/2021   10:37 AM 01/20/2021    9:46 AM 01/18/2020   10:32 AM  Fall Risk   Falls in the past year? 0 0 0 0 0  Number falls in past yr:  0 0    Injury with Fall?  0 0    Risk for fall due to :  No Fall Risks  Medication side effect Medication side effect  Follow up  Falls evaluation completed  Falls evaluation completed;Education provided;Falls prevention discussed Falls evaluation completed;Education provided;Falls prevention discussed    FALL RISK PREVENTION PERTAINING TO THE HOME:  Any stairs in or around the home? Yes  If so, are there any without handrails? No  Home free of loose throw rugs in walkways, pet beds, electrical  cords, etc? Yes  Adequate lighting in your home to reduce risk of falls? Yes   ASSISTIVE DEVICES UTILIZED TO PREVENT FALLS:  Life alert? No  Use of a cane, walker or w/c? No  Grab bars in the bathroom? Yes   Shower chair or bench in shower? Yes  Elevated toilet seat or a handicapped toilet? Yes   TIMED UP AND GO:  Was the test performed? No .    Cognitive Function:        01/20/2021    9:47 AM 01/18/2020   10:34 AM 01/14/2019   10:08 AM 12/23/2017    9:42 AM 12/19/2016    1:23 PM  6CIT Screen  What Year? 0 points 0 points 0 points 0 points 0 points  What month? 0 points 0 points 0 points 0 points 0 points  What time? 0 points 3 points 0 points 0 points 0 points  Count back from 20 0 points 0 points 0 points 0 points 0 points  Months in reverse 0 points 0 points 0 points 0 points 0 points  Repeat phrase 2 points 0 points 0 points 0 points 2 points  Total Score 2 points 3 points 0 points 0 points 2 points    Immunizations Immunization History  Administered Date(s) Administered   Fluad Quad(high Dose 65+) 03/12/2019, 03/09/2021   Influenza, High Dose Seasonal PF 02/19/2017, 03/05/2018, 04/11/2020   Influenza,inj,Quad PF,6+ Mos 03/15/2015   Influenza-Unspecified 04/11/2016, 03/12/2019   PFIZER(Purple Top)SARS-COV-2 Vaccination 07/02/2019, 07/23/2019, 03/11/2020   Pneumococcal Conjugate-13 02/03/2014   Pneumococcal Polysaccharide-23 01/19/2016   Pneumococcal-Unspecified 11/29/2010   Td 04/20/2008, 06/30/2018   Zoster Recombinat (Shingrix) 12/09/2017, 06/24/2018   Zoster, Live 05/30/2009    TDAP status: Up to date  Flu Vaccine status: Up to date  Pneumococcal vaccine status: Up to date  Covid-19 vaccine status: Information provided on how to obtain vaccines.   Qualifies for Shingles Vaccine? No   Zostavax completed Yes   Shingrix Completed?: Yes  Screening Tests Health Maintenance  Topic Date Due   COVID-19 Vaccine (4 - Pfizer risk series) 05/06/2020   URINE MICROALBUMIN  08/08/2021   INFLUENZA VACCINE  01/09/2022   HEMOGLOBIN A1C  02/09/2022   OPHTHALMOLOGY EXAM  03/01/2022   FOOT EXAM  08/10/2022   COLONOSCOPY (Pts 45-73yr Insurance coverage will need to be  confirmed)  03/16/2024   TETANUS/TDAP  06/30/2028   Pneumonia Vaccine 675 Years [place]  Completed   Hepatitis C Screening  Completed   Zoster Vaccines- Shingrix  Completed   HPV VACCINES  Aged Out    Health Maintenance  Health Maintenance Due  Topic Date Due   COVID-19 Vaccine (4 - Pfizer risk series) 05/06/2020   URINE MICROALBUMIN  08/08/2021   INFLUENZA VACCINE  01/09/2022    Colorectal cancer screening: Type of screening: Colonoscopy. Completed 2020. Repeat every 5 years  Lung Cancer Screening: (Low Dose CT Chest recommended if Age 73-80years, 30 pack-year currently smoking OR have quit w/in 15years.) does not qualify.   Lung Cancer Screening Referral:   Additional Screening:  Hepatitis C Screening: does not qualify; Completed 2017  Vision Screening: Recommended annual ophthalmology exams for early detection of glaucoma and other disorders of the eye. Is the patient up to date with their annual eye exam?  Yes  Who is the provider or what is the name of the office in which the patient attends annual eye exams? [facility] If pt is not established with a provider, would they like to be referred  to a provider to establish care? No .   Dental Screening: Recommended annual dental exams for proper oral hygiene  Community Resource Referral / Chronic Care Management: CRR required this visit?  No   CCM required this visit?  No      Plan:     I have personally reviewed and noted the following in the patient's chart:   Medical and social history Use of alcohol, tobacco or illicit drugs  Current medications and supplements including opioid prescriptions. Patient is not currently taking opioid prescriptions. Functional ability and status Nutritional status Physical activity Advanced directives List of other physicians Hospitalizations, surgeries, and ER visits in previous 12 months Vitals Screenings to include cognitive, depression, and falls Referrals and appointments  In  addition, I have reviewed and discussed with patient certain preventive protocols, quality metrics, and best practice recommendations. A written personalized care plan for preventive services as well as general preventive health recommendations were provided to patient.     Leroy Kennedy, LPN   [id number]   Nurse Notes:

## 2022-01-30 DIAGNOSIS — L821 Other seborrheic keratosis: Secondary | ICD-10-CM | POA: Diagnosis not present

## 2022-01-30 DIAGNOSIS — D2272 Melanocytic nevi of left lower limb, including hip: Secondary | ICD-10-CM | POA: Diagnosis not present

## 2022-01-30 DIAGNOSIS — D225 Melanocytic nevi of trunk: Secondary | ICD-10-CM | POA: Diagnosis not present

## 2022-01-30 DIAGNOSIS — D2261 Melanocytic nevi of right upper limb, including shoulder: Secondary | ICD-10-CM | POA: Diagnosis not present

## 2022-01-30 DIAGNOSIS — D2262 Melanocytic nevi of left upper limb, including shoulder: Secondary | ICD-10-CM | POA: Diagnosis not present

## 2022-01-30 DIAGNOSIS — D2271 Melanocytic nevi of right lower limb, including hip: Secondary | ICD-10-CM | POA: Diagnosis not present

## 2022-02-08 NOTE — Progress Notes (Signed)
BP 127/74   Pulse 76   Temp 97.6 F (36.4 C) (Oral)   Wt 209 lb 11.2 oz (95.1 kg)   SpO2 97%   BMI 32.84 kg/m    Subjective:    Patient ID: Randy Harper, male    DOB: 14-Oct-1950, 71 y.o.   MRN: [id number]  HPI: Randy Harper is a 71 y.o. male  Chief Complaint  Patient presents with   Hyperlipidemia   Hypertension   Diabetes    6 month follow up    HYPERTENSION / HYPERLIPIDEMIA Satisfied with current treatment? no Duration of hypertension: years BP monitoring frequency: not checking BP range:  BP medication side effects: no Past BP meds:  Cardizem Duration of hyperlipidemia: years Cholesterol medication side effects: no Cholesterol supplements: none Past cholesterol medications: atorvastain (lipitor) Medication compliance: excellent compliance Aspirin: no Recent stressors: no Recurrent headaches: no Visual changes: no Palpitations: no Dyspnea: no Chest pain: no Lower extremity edema: no Dizzy/lightheaded: no  DIABETES Hypoglycemic episodes:no Polydipsia/polyuria: no Visual disturbance: now on 11 week schedule for injections. Chest pain: no Paresthesias: no Glucose Monitoring: no  Accucheck frequency: Not Checking  Fasting glucose:  Post prandial:  Evening:  Before meals: Taking Insulin?: no  Long acting insulin:  Short acting insulin: Blood Pressure Monitoring: not checking Retinal Examination: Up to Date Foot Exam: Up to Date Diabetic Education: Not Completed Pneumovax: Up to Date Influenza: Not up to Date Aspirin: no  Relevant past medical, surgical, family and social history reviewed and updated as indicated. Interim medical history since our last visit reviewed. Allergies and medications reviewed and updated.  Review of Systems  Eyes:  Negative for visual disturbance.  Respiratory:  Negative for chest tightness and shortness of breath.   Cardiovascular:  Negative for chest pain, palpitations and leg swelling.  Endocrine: Negative for  polydipsia and polyuria.  Neurological:  Negative for dizziness, light-headedness, numbness and headaches.    Per HPI unless specifically indicated above     Objective:    BP 127/74   Pulse 76   Temp 97.6 F (36.4 C) (Oral)   Wt 209 lb 11.2 oz (95.1 kg)   SpO2 97%   BMI 32.84 kg/m   Wt Readings from Last 3 Encounters:  02/09/22 209 lb 11.2 oz (95.1 kg)  01/18/22 208 lb 6.4 oz (94.5 kg)  12/19/21 203 lb 6.4 oz (92.3 kg)    Physical Exam Vitals and nursing note reviewed.  Constitutional:      General: He is not in acute distress.    Appearance: Normal appearance. He is not ill-appearing, toxic-appearing or diaphoretic.  HENT:     Head: Normocephalic.     Right Ear: External ear normal.     Left Ear: External ear normal.     Nose: Nose normal. No congestion or rhinorrhea.     Mouth/Throat:     Mouth: Mucous membranes are moist.  Eyes:     General:        Right eye: No discharge.        Left eye: No discharge.     Extraocular Movements: Extraocular movements intact.     Conjunctiva/sclera: Conjunctivae normal.     Pupils: Pupils are equal, round, and reactive to light.  Cardiovascular:     Rate and Rhythm: Normal rate and regular rhythm.     Heart sounds: No murmur heard. Pulmonary:     Effort: Pulmonary effort is normal. No respiratory distress.     Breath sounds: Normal breath sounds.  No wheezing, rhonchi or rales.  Abdominal:     General: Abdomen is flat. Bowel sounds are normal.  Musculoskeletal:     Cervical back: Normal range of motion and neck supple.  Skin:    General: Skin is warm and dry.     Capillary Refill: Capillary refill takes less than 2 seconds.  Neurological:     General: No focal deficit present.     Mental Status: He is alert and oriented to person, place, and time.  Psychiatric:        Mood and Affect: Mood normal.        Behavior: Behavior normal.        Thought Content: Thought content normal.        Judgment: Judgment normal.      Results for orders placed or performed in visit on 12/11/21  Comp Met (CMET)  Result Value Ref Range   Glucose 113 (H) 70 - 99 mg/dL   BUN 10 8 - 27 mg/dL   Creatinine, Ser 0.74 (L) 0.76 - 1.27 mg/dL   eGFR 96 >59 mL/min/1.73   BUN/Creatinine Ratio 14 10 - 24   Sodium 140 134 - 144 mmol/L   Potassium 4.9 3.5 - 5.2 mmol/L   Chloride 102 96 - 106 mmol/L   CO2 24 20 - 29 mmol/L   Calcium 8.8 8.6 - 10.2 mg/dL   Total Protein 6.0 6.0 - 8.5 g/dL   Albumin 3.6 (L) 3.7 - 4.7 g/dL   Globulin, Total 2.4 1.5 - 4.5 g/dL   Albumin/Globulin Ratio 1.5 1.2 - 2.2   Bilirubin Total 0.3 0.0 - 1.2 mg/dL   Alkaline Phosphatase 135 (H) 44 - 121 IU/L   AST 24 0 - 40 IU/L   ALT 31 0 - 44 IU/L  CBC w/Diff  Result Value Ref Range   WBC 7.5 3.4 - 10.8 x10E3/uL   RBC 3.94 (L) 4.14 - 5.80 x10E6/uL   Hemoglobin 12.4 (L) 13.0 - 17.7 g/dL   Hematocrit 36.4 (L) 37.5 - 51.0 %   MCV 92 79 - 97 fL   MCH 31.5 26.6 - 33.0 pg   MCHC 34.1 31.5 - 35.7 g/dL   RDW 12.4 11.6 - 15.4 %   Platelets 324 150 - 450 x10E3/uL   Neutrophils 68 Not Estab. %   Lymphs 17 Not Estab. %   Monocytes 12 Not Estab. %   Eos 2 Not Estab. %   Basos 1 Not Estab. %   Neutrophils Absolute 5.2 1.4 - 7.0 x10E3/uL   Lymphocytes Absolute 1.3 0.7 - 3.1 x10E3/uL   Monocytes Absolute 0.9 0.1 - 0.9 x10E3/uL   EOS (ABSOLUTE) 0.2 0.0 - 0.4 x10E3/uL   Basophils Absolute 0.0 0.0 - 0.2 x10E3/uL   Immature Granulocytes 0 Not Estab. %   Immature Grans (Abs) 0.0 0.0 - 0.1 x10E3/uL      Assessment & Plan:   Problem List Items Addressed This Visit       Cardiovascular and Mediastinum   Paroxysmal atrial flutter (HCC) - Primary    Chronic.  Controlled.  Continue with current medication regimen of Cardizem.  Continue to follow up with Cardiology.  Labs ordered today.  Return to clinic in 6 months for reevaluation.  Call sooner if concerns arise.        Relevant Medications   atorvastatin (LIPITOR) 80 MG tablet   diltiazem (CARDIZEM CD)  240 MG 24 hr capsule   Other Relevant Orders   Comp Met (CMET)   Hypertension  associated with diabetes ([place])    Chronic.  Controlled.  Continue with current medication regimen of Cardizem.  Labs ordered today.  Return to clinic in 6 months for reevaluation.  Call sooner if concerns arise.        Relevant Medications   atorvastatin (LIPITOR) 80 MG tablet   diltiazem (CARDIZEM CD) 240 MG 24 hr capsule   metFORMIN (GLUCOPHAGE) 500 MG tablet   Other Relevant Orders   Comp Met (CMET)     Digestive   SBO (small bowel obstruction) (HCC)    Resolved.  Had Surgery in June 2023.  Has completed follow up with Surgeon.          Endocrine   Controlled diabetes mellitus type 2 with complications (HCC)    Chronic.  Controlled.  Last A1c was 5.6.  Continue with current medication regimen of Metformin 532m daily.  Refill sent today.  Labs ordered today.  Microalbumin ordered today.  Return to clinic in 6 months for reevaluation.  Call sooner if concerns arise.        Relevant Medications   atorvastatin (LIPITOR) 80 MG tablet   metFORMIN (GLUCOPHAGE) 500 MG tablet   Other Relevant Orders   HgB A1c   Microalbumin, Urine Waived   RESOLVED: Diabetes mellitus type 2, noninsulin dependent (HCC)   Relevant Medications   atorvastatin (LIPITOR) 80 MG tablet   metFORMIN (GLUCOPHAGE) 500 MG tablet     Other   Hyperlipidemia    Chronic.  Controlled.  Continue with current medication regimen of Atorvastatin daily.  Labs ordered today.  Return to clinic in 6 months for reevaluation.  Call sooner if concerns arise.        Relevant Medications   atorvastatin (LIPITOR) 80 MG tablet   diltiazem (CARDIZEM CD) 240 MG 24 hr capsule   Other Relevant Orders   Lipid Profile     Follow up plan: Return in about 6 months (around 08/10/2022) for Physical and Fasting labs.

## 2022-02-09 ENCOUNTER — Encounter: Payer: Self-pay | Admitting: Nurse Practitioner

## 2022-02-09 ENCOUNTER — Ambulatory Visit (INDEPENDENT_AMBULATORY_CARE_PROVIDER_SITE_OTHER): Payer: Medicare Other | Admitting: Nurse Practitioner

## 2022-02-09 VITALS — BP 127/74 | HR 76 | Temp 97.6°F | Wt 209.7 lb

## 2022-02-09 DIAGNOSIS — E119 Type 2 diabetes mellitus without complications: Secondary | ICD-10-CM

## 2022-02-09 DIAGNOSIS — E1159 Type 2 diabetes mellitus with other circulatory complications: Secondary | ICD-10-CM

## 2022-02-09 DIAGNOSIS — I152 Hypertension secondary to endocrine disorders: Secondary | ICD-10-CM | POA: Diagnosis not present

## 2022-02-09 DIAGNOSIS — E118 Type 2 diabetes mellitus with unspecified complications: Secondary | ICD-10-CM | POA: Diagnosis not present

## 2022-02-09 DIAGNOSIS — E782 Mixed hyperlipidemia: Secondary | ICD-10-CM

## 2022-02-09 DIAGNOSIS — I4892 Unspecified atrial flutter: Secondary | ICD-10-CM

## 2022-02-09 DIAGNOSIS — K56609 Unspecified intestinal obstruction, unspecified as to partial versus complete obstruction: Secondary | ICD-10-CM | POA: Diagnosis not present

## 2022-02-09 LAB — MICROALBUMIN, URINE WAIVED
Creatinine, Urine Waived: 100 mg/dL (ref 10–300)
Microalb, Ur Waived: 30 mg/L — ABNORMAL HIGH (ref 0–19)
Microalb/Creat Ratio: <30 mg/g (ref <30)

## 2022-02-09 MED ORDER — ATORVASTATIN CALCIUM 80 MG PO TABS: 80.0000 mg | ORAL_TABLET | Freq: Every day | ORAL | 1 refills | Status: DC

## 2022-02-09 MED ORDER — METFORMIN HCL 500 MG PO TABS: 500.0000 mg | ORAL_TABLET | Freq: Every day | ORAL | 1 refills | Status: DC

## 2022-02-09 MED ORDER — DILTIAZEM HCL ER COATED BEADS 240 MG PO CP24: 240.0000 mg | ORAL_CAPSULE | Freq: Every day | ORAL | 1 refills | Status: DC

## 2022-02-09 NOTE — Assessment & Plan Note (Signed)
Chronic.  Controlled.  Last A1c was 5.6.  Continue with current medication regimen of Metformin '500mg'$  daily.  Refill sent today.  Labs ordered today.  Microalbumin ordered today.  Return to clinic in 6 months for reevaluation.  Call sooner if concerns arise.

## 2022-02-09 NOTE — Assessment & Plan Note (Signed)
Resolved.  Had Surgery in June 2023.  Has completed follow up with Surgeon.

## 2022-02-09 NOTE — Assessment & Plan Note (Signed)
Chronic.  Controlled.  Continue with current medication regimen of Atorvastatin daily.  Labs ordered today.  Return to clinic in 6 months for reevaluation.  Call sooner if concerns arise.   

## 2022-02-09 NOTE — Assessment & Plan Note (Signed)
Chronic.  Controlled.  Continue with current medication regimen of Cardizem.  Continue to follow up with Cardiology.  Labs ordered today.  Return to clinic in 6 months for reevaluation.  Call sooner if concerns arise.

## 2022-02-09 NOTE — Assessment & Plan Note (Signed)
Chronic.  Controlled.  Continue with current medication regimen of Cardizem.  Labs ordered today.  Return to clinic in 6 months for reevaluation.  Call sooner if concerns arise.

## 2022-02-10 LAB — COMPREHENSIVE METABOLIC PANEL
ALT: 18 IU/L (ref 0–44)
AST: 21 IU/L (ref 0–40)
Albumin/Globulin Ratio: 2 (ref 1.2–2.2)
Albumin: 4.4 g/dL (ref 3.8–4.8)
Alkaline Phosphatase: 101 IU/L (ref 44–121)
BUN/Creatinine Ratio: 20 (ref 10–24)
BUN: 14 mg/dL (ref 8–27)
Bilirubin Total: 0.4 mg/dL (ref 0.0–1.2)
CO2: 24 mmol/L (ref 20–29)
Calcium: 9 mg/dL (ref 8.6–10.2)
Chloride: 102 mmol/L (ref 96–106)
Creatinine, Ser: 0.71 mg/dL — ABNORMAL LOW (ref 0.76–1.27)
Globulin, Total: 2.2 g/dL (ref 1.5–4.5)
Glucose: 97 mg/dL (ref 70–99)
Potassium: 4.3 mmol/L (ref 3.5–5.2)
Sodium: 141 mmol/L (ref 134–144)
Total Protein: 6.6 g/dL (ref 6.0–8.5)
eGFR: 97 mL/min/{1.73_m2} (ref >59)

## 2022-02-10 LAB — LIPID PANEL
Chol/HDL Ratio: 3.4 ratio (ref 0.0–5.0)
Cholesterol, Total: 141 mg/dL (ref 100–199)
HDL: 41 mg/dL (ref >39)
LDL Chol Calc (NIH): 78 mg/dL (ref 0–99)
Triglycerides: 120 mg/dL (ref 0–149)
VLDL Cholesterol Cal: 22 mg/dL (ref 5–40)

## 2022-02-10 LAB — HEMOGLOBIN A1C
Est. average glucose Bld gHb Est-mCnc: 117 mg/dL
Hgb A1c MFr Bld: 5.7 % — ABNORMAL HIGH (ref 4.8–5.6)

## 2022-02-13 NOTE — Progress Notes (Signed)
HI Randy Harper. It was great to see you last week.  Your lab work looks great.  Your A1c is well controlled at 5.7.  Keep up the good work.  Your liver, kidneys and electrolytes look good.  Continue with your current medication regimen.  No other concerns at this time.

## 2022-03-01 DIAGNOSIS — M539 Dorsopathy, unspecified: Secondary | ICD-10-CM | POA: Diagnosis not present

## 2022-03-19 DIAGNOSIS — M533 Sacrococcygeal disorders, not elsewhere classified: Secondary | ICD-10-CM | POA: Diagnosis not present

## 2022-03-21 DIAGNOSIS — M5136 Other intervertebral disc degeneration, lumbar region: Secondary | ICD-10-CM | POA: Diagnosis not present

## 2022-03-21 DIAGNOSIS — M25551 Pain in right hip: Secondary | ICD-10-CM | POA: Diagnosis not present

## 2022-04-04 DIAGNOSIS — H353221 Exudative age-related macular degeneration, left eye, with active choroidal neovascularization: Secondary | ICD-10-CM | POA: Diagnosis not present

## 2022-04-04 DIAGNOSIS — H353112 Nonexudative age-related macular degeneration, right eye, intermediate dry stage: Secondary | ICD-10-CM | POA: Diagnosis not present

## 2022-04-04 DIAGNOSIS — H43393 Other vitreous opacities, bilateral: Secondary | ICD-10-CM | POA: Diagnosis not present

## 2022-04-04 DIAGNOSIS — E119 Type 2 diabetes mellitus without complications: Secondary | ICD-10-CM | POA: Diagnosis not present

## 2022-04-06 DIAGNOSIS — C7B8 Other secondary neuroendocrine tumors: Secondary | ICD-10-CM | POA: Diagnosis not present

## 2022-04-06 DIAGNOSIS — C7A8 Other malignant neuroendocrine tumors: Secondary | ICD-10-CM | POA: Diagnosis not present

## 2022-04-15 ENCOUNTER — Other Ambulatory Visit: Payer: Self-pay | Admitting: Nurse Practitioner

## 2022-04-15 DIAGNOSIS — I152 Hypertension secondary to endocrine disorders: Secondary | ICD-10-CM

## 2022-04-16 NOTE — Telephone Encounter (Signed)
Last RF 02/09/22 #90 1 RF   Requested Prescriptions  Refused Prescriptions Disp Refills   diltiazem (CARDIZEM CD) 240 MG 24 hr capsule [Pharmacy Med Name: DILTIAZEM  '240MG'$   CAP  24 HR CD] 100 capsule 2    Sig: TAKE 1 CAPSULE BY MOUTH DAILY     Cardiovascular: Calcium Channel Blockers 3 Failed - 04/15/2022 10:14 PM      Failed - Cr in normal range and within 360 days    Creatinine, Ser  Date Value Ref Range Status  02/09/2022 0.71 (L) 0.76 - 1.27 mg/dL Final         Passed - ALT in normal range and within 360 days    ALT  Date Value Ref Range Status  02/09/2022 18 0 - 44 IU/L Final   ALT (SGPT) Piccolo, Waived  Date Value Ref Range Status  01/14/2019 26 10 - 47 U/L Final         Passed - AST in normal range and within 360 days    AST  Date Value Ref Range Status  02/09/2022 21 0 - 40 IU/L Final   AST (SGOT) Piccolo, Waived  Date Value Ref Range Status  01/14/2019 32 11 - 38 U/L Final         Passed - Last BP in normal range    BP Readings from Last 1 Encounters:  02/09/22 127/74         Passed - Last Heart Rate in normal range    Pulse Readings from Last 1 Encounters:  02/09/22 76         Passed - Valid encounter within last 6 months    Recent Outpatient Visits           2 months ago Paroxysmal atrial flutter ([place])   [facility] Jon Billings, NP   4 months ago Hospital discharge follow-up   [place], Dani Gobble, PA-C   8 months ago Annual physical exam   [facility] Jon Billings, NP   1 year ago Right calf pain   [facility] McElwee, Lauren A, NP   1 year ago Diabetes mellitus associated with hormonal etiology ([place])   [place], Karen, NP       Future Appointments             In 3 months Jon Billings, NP [facility], [place]

## 2022-05-21 DIAGNOSIS — M5136 Other intervertebral disc degeneration, lumbar region: Secondary | ICD-10-CM | POA: Diagnosis not present

## 2022-06-13 DIAGNOSIS — H353112 Nonexudative age-related macular degeneration, right eye, intermediate dry stage: Secondary | ICD-10-CM | POA: Diagnosis not present

## 2022-06-13 DIAGNOSIS — H43393 Other vitreous opacities, bilateral: Secondary | ICD-10-CM | POA: Diagnosis not present

## 2022-06-13 DIAGNOSIS — E119 Type 2 diabetes mellitus without complications: Secondary | ICD-10-CM | POA: Diagnosis not present

## 2022-06-13 DIAGNOSIS — H353221 Exudative age-related macular degeneration, left eye, with active choroidal neovascularization: Secondary | ICD-10-CM | POA: Diagnosis not present

## 2022-06-22 ENCOUNTER — Telehealth: Payer: Self-pay | Admitting: Nurse Practitioner

## 2022-06-22 DIAGNOSIS — E1159 Type 2 diabetes mellitus with other circulatory complications: Secondary | ICD-10-CM

## 2022-06-22 DIAGNOSIS — E782 Mixed hyperlipidemia: Secondary | ICD-10-CM

## 2022-06-25 NOTE — Telephone Encounter (Signed)
Unable to refill per protocol, Rx request is too soon. Last refill 02/09/22 for 90 and 1 refill. Will refuse.  Requested Prescriptions  Pending Prescriptions Disp Refills   metFORMIN (GLUCOPHAGE) 500 MG tablet [Pharmacy Med Name: metFORMIN HCl 500 MG Oral Tablet] 100 tablet 2    Sig: TAKE 1 TABLET BY MOUTH DAILY  WITH BREAKFAST     Endocrinology:  Diabetes - Biguanides Failed - 06/22/2022 10:23 PM      Failed - Cr in normal range and within 360 days    Creatinine, Ser  Date Value Ref Range Status  02/09/2022 0.71 (L) 0.76 - 1.27 mg/dL Final         Failed - B12 Level in normal range and within 720 days    No results found for: "VITAMINB12"       Passed - HBA1C is between 0 and 7.9 and within 180 days    Hemoglobin A1C  Date Value Ref Range Status  01/19/2016 6.3  Final   HB A1C (BAYER DCA - WAIVED)  Date Value Ref Range Status  01/14/2019 5.6 <7.0 % Final    Comment:                                          Diabetic Adult            <7.0                                       Healthy Adult        4.3 - 5.7                                                           (DCCT/NGSP) American Diabetes Association's Summary of Glycemic Recommendations for Adults with Diabetes: Hemoglobin A1c <7.0%. More stringent glycemic goals (A1c <6.0%) may further reduce complications at the cost of increased risk of hypoglycemia.    Hgb A1c MFr Bld  Date Value Ref Range Status  02/09/2022 5.7 (H) 4.8 - 5.6 % Final    Comment:             Prediabetes: 5.7 - 6.4          Diabetes: >6.4          Glycemic control for adults with diabetes: <7.0          Passed - eGFR in normal range and within 360 days    GFR calc Af Amer  Date Value Ref Range Status  02/04/2020 110 >59 mL/min/1.73 Final    Comment:    **[facility] currently reports eGFR in compliance with the current**   recommendations of the [employer]. [facility] will   update reporting as new guidelines are published from the  NKF-ASN   Task force.    GFR, Estimated  Date Value Ref Range Status  12/04/2021 >60 >60 mL/min Final    Comment:    (NOTE) Calculated using the CKD-EPI Creatinine Equation (2021)    eGFR  Date Value Ref Range Status  02/09/2022 97 >59 mL/min/1.73 Final         Passed - Valid encounter  within last 6 months    Recent Outpatient Visits           4 months ago Paroxysmal atrial flutter ([place])   [facility] Jon Billings, NP   6 months ago Hospital discharge follow-up   [place], Dani Gobble, PA-C   10 months ago Annual physical exam   [facility] Jon Billings, NP   1 year ago Right calf pain   [facility] McElwee, Lauren A, NP   1 year ago Diabetes mellitus associated with hormonal etiology ([place])   [place], Karen, NP       Future Appointments             In 1 month Jon Billings, NP [facility], [facility]            Passed - CBC within normal limits and completed in the last 12 months    WBC  Date Value Ref Range Status  12/11/2021 7.5 3.4 - 10.8 x10E3/uL Final  12/04/2021 5.3 4.0 - 10.5 K/uL Final   RBC  Date Value Ref Range Status  12/11/2021 3.94 (L) 4.14 - 5.80 x10E6/uL Final  12/04/2021 3.59 (L) 4.22 - 5.81 MIL/uL Final   Hemoglobin  Date Value Ref Range Status  12/11/2021 12.4 (L) 13.0 - 17.7 g/dL Final   Hematocrit  Date Value Ref Range Status  12/11/2021 36.4 (L) 37.5 - 51.0 % Final   MCHC  Date Value Ref Range Status  12/11/2021 34.1 31.5 - 35.7 g/dL Final  12/04/2021 32.4 30.0 - 36.0 g/dL Final   [facility]  Date Value Ref Range Status  12/11/2021 31.5 26.6 - 33.0 pg Final  12/04/2021 30.6 26.0 - 34.0 pg Final   MCV  Date Value Ref Range Status  12/11/2021 92 79 - 97 fL Final   No results found for: "PLTCOUNTKUC", "LABPLAT", "POCPLA" RDW  Date Value Ref Range Status  12/11/2021 12.4 11.6 - 15.4 % Final          atorvastatin (LIPITOR)  80 MG tablet [Pharmacy Med Name: Atorvastatin Calcium 80 MG Oral Tablet] 100 tablet 2    Sig: TAKE 1 TABLET BY MOUTH DAILY     Cardiovascular:  Antilipid - Statins Failed - 06/22/2022 10:23 PM      Failed - Lipid Panel in normal range within the last 12 months    Cholesterol, Total  Date Value Ref Range Status  02/09/2022 141 100 - 199 mg/dL Final   Cholesterol Piccolo, Waived  Date Value Ref Range Status  01/14/2019 147 <200 mg/dL Final    Comment:                            Desirable                <200                         Borderline High      200- 239                         High                     >239    LDL Chol Calc (NIH)  Date Value Ref Range Status  02/09/2022 78 0 - 99 mg/dL Final   HDL  Date Value Ref Range Status  02/09/2022 41 >39 mg/dL Final   Triglycerides  Date Value Ref Range Status  02/09/2022 120 0 - 149 mg/dL Final   Triglycerides Piccolo,Waived  Date Value Ref Range Status  01/14/2019 113 <150 mg/dL Final    Comment:                            Normal                   <150                         Borderline High     150 - 199                         High                200 - 499                         Very High                >499          Passed - Patient is not pregnant      Passed - Valid encounter within last 12 months    Recent Outpatient Visits           4 months ago Paroxysmal atrial flutter ([place])   [place], NP   6 months ago Hospital discharge follow-up   [place], Dani Gobble, PA-C   10 months ago Annual physical exam   [facility] Jon Billings, NP   1 year ago Right calf pain   [facility] McElwee, Lauren A, NP   1 year ago Diabetes mellitus associated with hormonal etiology ([place])   [place] Jon Billings, NP       Future Appointments             In 1 month Jon Billings, NP [facility], [place]

## 2022-06-26 NOTE — Telephone Encounter (Signed)
Pt called to report that he has enough atorvastatin to last him for a while but he believes he will run out before March.

## 2022-08-08 DIAGNOSIS — H43393 Other vitreous opacities, bilateral: Secondary | ICD-10-CM | POA: Diagnosis not present

## 2022-08-08 DIAGNOSIS — H353112 Nonexudative age-related macular degeneration, right eye, intermediate dry stage: Secondary | ICD-10-CM | POA: Diagnosis not present

## 2022-08-08 DIAGNOSIS — E119 Type 2 diabetes mellitus without complications: Secondary | ICD-10-CM | POA: Diagnosis not present

## 2022-08-08 DIAGNOSIS — H43813 Vitreous degeneration, bilateral: Secondary | ICD-10-CM | POA: Diagnosis not present

## 2022-08-08 DIAGNOSIS — H353221 Exudative age-related macular degeneration, left eye, with active choroidal neovascularization: Secondary | ICD-10-CM | POA: Diagnosis not present

## 2022-08-10 ENCOUNTER — Encounter: Payer: Medicare Other | Admitting: Nurse Practitioner

## 2022-08-24 ENCOUNTER — Encounter: Payer: Self-pay | Admitting: Nurse Practitioner

## 2022-08-24 ENCOUNTER — Ambulatory Visit (INDEPENDENT_AMBULATORY_CARE_PROVIDER_SITE_OTHER): Payer: Medicare Other | Admitting: Nurse Practitioner

## 2022-08-24 VITALS — BP 128/69 | HR 80 | Temp 97.7°F | Ht 69.3 in | Wt 214.8 lb

## 2022-08-24 DIAGNOSIS — E118 Type 2 diabetes mellitus with unspecified complications: Secondary | ICD-10-CM

## 2022-08-24 DIAGNOSIS — H353220 Exudative age-related macular degeneration, left eye, stage unspecified: Secondary | ICD-10-CM

## 2022-08-24 DIAGNOSIS — I152 Hypertension secondary to endocrine disorders: Secondary | ICD-10-CM

## 2022-08-24 DIAGNOSIS — E1169 Type 2 diabetes mellitus with other specified complication: Secondary | ICD-10-CM | POA: Diagnosis not present

## 2022-08-24 DIAGNOSIS — Z0000 Encounter for general adult medical examination without abnormal findings: Secondary | ICD-10-CM | POA: Diagnosis not present

## 2022-08-24 DIAGNOSIS — E1159 Type 2 diabetes mellitus with other circulatory complications: Secondary | ICD-10-CM | POA: Diagnosis not present

## 2022-08-24 DIAGNOSIS — E782 Mixed hyperlipidemia: Secondary | ICD-10-CM

## 2022-08-24 DIAGNOSIS — E785 Hyperlipidemia, unspecified: Secondary | ICD-10-CM | POA: Diagnosis not present

## 2022-08-24 DIAGNOSIS — I4892 Unspecified atrial flutter: Secondary | ICD-10-CM | POA: Diagnosis not present

## 2022-08-24 DIAGNOSIS — H353290 Exudative age-related macular degeneration, unspecified eye, stage unspecified: Secondary | ICD-10-CM | POA: Insufficient documentation

## 2022-08-24 LAB — URINALYSIS, ROUTINE W REFLEX MICROSCOPIC
Bilirubin, UA: NEGATIVE
Glucose, UA: NEGATIVE
Ketones, UA: NEGATIVE
Leukocytes,UA: NEGATIVE
Nitrite, UA: NEGATIVE
RBC, UA: NEGATIVE
Specific Gravity, UA: 1.015 (ref 1.005–1.030)
Urobilinogen, Ur: >8 mg/dL — ABNORMAL HIGH (ref 0.2–1.0)
pH, UA: 7 (ref 5.0–7.5)

## 2022-08-24 MED ORDER — ATORVASTATIN CALCIUM 80 MG PO TABS: 80.0000 mg | ORAL_TABLET | Freq: Every day | ORAL | 1 refills | Status: DC

## 2022-08-24 MED ORDER — METFORMIN HCL 500 MG PO TABS: 500.0000 mg | ORAL_TABLET | Freq: Every day | ORAL | 1 refills | Status: DC

## 2022-08-24 MED ORDER — DILTIAZEM HCL ER COATED BEADS 240 MG PO CP24: 240.0000 mg | ORAL_CAPSULE | Freq: Every day | ORAL | 1 refills | Status: DC

## 2022-08-24 NOTE — Assessment & Plan Note (Signed)
Chronic.  Left eye only at this time.  Followed by [facility].

## 2022-08-24 NOTE — Assessment & Plan Note (Signed)
Chronic.  Controlled.  Continue with current medication regimen of Cardizem.  Labs ordered today.  Return to clinic in 6 months for reevaluation.  Call sooner if concerns arise.   

## 2022-08-24 NOTE — Assessment & Plan Note (Signed)
Chronic.  Controlled.  Continue with current medication regimen of Atorvastatin 80mg daily. Refills sent today.  Labs ordered today.  Return to clinic in 6 months for reevaluation.  Call sooner if concerns arise.   

## 2022-08-24 NOTE — Assessment & Plan Note (Signed)
Chronic.  Controlled.  Last A1c was 5.6.  Continue with current medication regimen of Metformin 500mg  daily.  Refill sent today.  Labs ordered today.  Microalbumin up to date.  Has eye exam scheduled for April 3.  Return to clinic in 6 months for reevaluation.  Call sooner if concerns arise.

## 2022-08-24 NOTE — Progress Notes (Signed)
BP 128/69   Pulse 80   Temp 97.7 F (36.5 C) (Oral)   Ht 5' 9.3" (1.76 m)   Wt 214 lb 12.8 oz (97.4 kg)   SpO2 98%   BMI 31.45 kg/m    Subjective:    Patient ID: Randy Harper, male    DOB: 1951-04-27, 72 y.o.   MRN: [id number]  HPI: Randy Harper is a 72 y.o. male presenting on 08/24/2022 for comprehensive medical examination. Current medical complaints include:none  He currently lives with: Interim Problems from his last visit: no  HYPERTENSION / HYPERLIPIDEMIA Satisfied with current treatment? yes Duration of hypertension: years BP monitoring frequency: not checking BP range:  BP medication side effects: no Past BP meds: cardizem Duration of hyperlipidemia: years Cholesterol medication side effects: no Cholesterol supplements: none Past cholesterol medications: atorvastain (lipitor) Medication compliance: excellent compliance Aspirin: no Recent stressors: no Recurrent headaches: no Visual changes: no Palpitations: no Dyspnea: no Chest pain: no Lower extremity edema: no Dizzy/lightheaded: no  DIABETES Metformin 500mg  Daily Hypoglycemic episodes:no Polydipsia/polyuria: no Visual disturbance: no Chest pain: no Paresthesias: no Glucose Monitoring: no  Accucheck frequency: Not Checking  Fasting glucose:  Post prandial:  Evening:  Before meals: Taking Insulin?: no  Long acting insulin:  Short acting insulin: Blood Pressure Monitoring: not checking Retinal Examination: Up to Date Foot Exam: Up to Date Diabetic Education: Not Completed Pneumovax: Up to Date Influenza: Up to Date Aspirin: no  Depression Screen done today and results listed below:     08/24/2022    9:02 AM 02/09/2022    8:24 AM 01/22/2022   10:10 AM 08/09/2021    8:10 AM 03/02/2021   10:37 AM  Depression screen PHQ 2/9  Decreased Interest 0 0 0 0 0  Down, Depressed, Hopeless 0 0 0 0 0  PHQ - 2 Score 0 0 0 0 0  Altered sleeping 0 0  0   Tired, decreased energy 0 0  0   Change in  appetite 0 0  0   Feeling bad or failure about yourself  0 0  0   Trouble concentrating 0 0  0   Moving slowly or fidgety/restless 0 0  0   Suicidal thoughts 0 0  0   PHQ-9 Score 0 0  0   Difficult doing work/chores Not difficult at all Not difficult at all  Not difficult at all     The patient does not have a history of falls. I did complete a risk assessment for falls. A plan of care for falls was documented.   Past Medical History:  Past Medical History:  Diagnosis Date   Ankylosing spondylitis ([place]) 11/09/2021   Blood transfusion without reported diagnosis 01/2017   Cancer ([place]) 01/2017   metastic neuroendocrine - liver   Diabetes mellitus without complication ([place])    Dysrhythmia    supraventricular Tach   Glaucoma 2015   Hyperlipidemia    Hypertension    Muscle strain of chest wall    PE (pulmonary thromboembolism) (HCC)    SBO (small bowel obstruction) ([place]) 11/29/2021   Small bowel obstruction ([place]) 11/28/2021   Supraventricular tachycardia     Surgical History:  Past Surgical History:  Procedure Laterality Date   CHOLECYSTECTOMY  01/2017   related to liver cancer   COLONOSCOPY     COLONOSCOPY WITH PROPOFOL N/A 03/17/2019   Procedure: COLONOSCOPY WITH PROPOFOL;  Surgeon: Lollie Sails, MD;  Location: [facility] ENDOSCOPY;  Service: Endoscopy;  Laterality: N/A;  ESOPHAGOGASTRODUODENOSCOPY N/A 06/06/2021   Procedure: ESOPHAGOGASTRODUODENOSCOPY (EGD);  Surgeon: Virgel Manifold, MD;  Location[facility] ENDOSCOPY;  Service: Endoscopy;  Laterality: N/A;   ESOPHAGOGASTRODUODENOSCOPY (EGD) WITH PROPOFOL N/A 08/22/2021   Procedure: ESOPHAGOGASTRODUODENOSCOPY (EGD) WITH PROPOFOL;  Surgeon: Lesly Rubenstein, MD;  Location: [facility] ENDOSCOPY;  Service: Endoscopy;  Laterality: N/A;  DM   LAPAROSCOPY ABDOMEN DIAGNOSTIC     LAPAROTOMY N/A 11/30/2021   Procedure: EXPLORATORY LAPAROTOMY;  Surgeon: Jules Husbands, MD;  Location: [facility] ORS;  Service: General;  Laterality: N/A;   LYSIS  OF ADHESION N/A 11/30/2021   Procedure: LYSIS OF ADHESION;  Surgeon: Jules Husbands, MD;  Location: [facility] ORS;  Service: General;  Laterality: N/A;   resection liver total right lobe  12/2016   SMALL INTESTINE SURGERY  01/2017   cancer surgery   TENDON GRAFT Right    right thumb tendon graft at age 71    [place] N/A 11/30/2021   Procedure: HERNIA REPAIR VENTRAL ADULT;  Surgeon: Jules Husbands, MD;  Location: [facility] ORS;  Service: General;  Laterality: N/A;    Medications:  Current Outpatient Medications on File Prior to Visit  Medication Sig   acetaminophen (TYLENOL) 500 MG tablet Take 500 mg by mouth every 6 (six) hours as needed for mild pain, moderate pain or headache.   Aflibercept 2 MG/0.05ML SOLN Apply to eye. Every 6 weeks injection   brimonidine (ALPHAGAN) 0.2 % ophthalmic solution Place 1 drop into the left eye daily.    Multiple Vitamin (MULTIVITAMIN) tablet Take 1 tablet by mouth daily.   Multiple Vitamins-Minerals (PRESERVISION AREDS 2+MULTI VIT PO) Take 1 tablet by mouth daily.   pantoprazole (PROTONIX) 40 MG tablet Take 1 tablet (40 mg total) by mouth daily for 28 days.   No current facility-administered medications on file prior to visit.    Allergies:  No Known Allergies  Social History:  Social History   Socioeconomic History   Marital status: Married    Spouse name: Not on file   Number of children: Not on file   Years of education: Not on file   Highest education level: Master's degree (e.g., MA, MS, MEng, MEd, MSW, MBA)  Occupational History   Occupation: retired  Tobacco Use   Smoking status: Never   Smokeless tobacco: Never  Vaping Use   Vaping Use: Never used  Substance and Sexual Activity   Alcohol use: No   Drug use: No   Sexual activity: Yes    Birth control/protection: None  Other Topics Concern   Not on file  Social History Narrative   Not on file   Social Determinants of Health   Financial Resource Strain: Low Risk   (01/22/2022)   Overall Financial Resource Strain (CARDIA)    Difficulty of Paying Living Expenses: Not hard at all  Food Insecurity: No Food Insecurity (01/22/2022)   Hunger Vital Sign    Worried About Running Out of Food in the Last Year: Never true    [place] in the Last Year: Never true  Transportation Needs: No Transportation Needs (01/22/2022)   PRAPARE - Hydrologist (Medical): No    Lack of Transportation (Non-Medical): No  Physical Activity: Sufficiently Active (01/22/2022)   Exercise Vital Sign    Days of Exercise per Week: 5 days    Minutes of Exercise per Session: 50 min  Stress: No Stress Concern Present (01/22/2022)   [place]  Feeling of Stress : Not at all  Social Connections: Socially Integrated (01/22/2022)   Social Connection and Isolation Panel [NHANES]    Frequency of Communication with Friends and Family: More than three times a week    Frequency of Social Gatherings with Friends and Family: Twice a week    Attends Religious Services: More than 4 times per year    Active Member of [employer] or Organizations: No    Attends Music therapist: More than 4 times per year    Marital Status: Married  Human resources officer Violence: Not At Risk (01/22/2022)   Humiliation, Afraid, Rape, and Kick questionnaire    Fear of Current or Ex-Partner: No    Emotionally Abused: No    Physically Abused: No    Sexually Abused: No   Social History   Tobacco Use  Smoking Status Never  Smokeless Tobacco Never   Social History   Substance and Sexual Activity  Alcohol Use No    Family History:  Family History  Problem Relation Age of Onset   Heart disease Father    Diabetes Maternal Uncle    Diabetes Maternal Uncle    Diabetes Maternal Uncle    Diabetes Maternal Uncle    Diabetes Maternal Grandmother     Past medical history, surgical history, medications,  allergies, family history and social history reviewed with patient today and changes made to appropriate areas of the chart.   Review of Systems  HENT:         Denies vision changes.  Eyes:  Negative for blurred vision and double vision.  Respiratory:  Negative for shortness of breath.   Cardiovascular:  Negative for chest pain, palpitations and leg swelling.  Neurological:  Negative for dizziness, tingling and headaches.  Endo/Heme/Allergies:  Negative for polydipsia.       Denies Polyuria   All other ROS negative except what is listed above and in the HPI.      Objective:    BP 128/69   Pulse 80   Temp 97.7 F (36.5 C) (Oral)   Ht 5' 9.3" (1.76 m)   Wt 214 lb 12.8 oz (97.4 kg)   SpO2 98%   BMI 31.45 kg/m   Wt Readings from Last 3 Encounters:  08/24/22 214 lb 12.8 oz (97.4 kg)  02/09/22 209 lb 11.2 oz (95.1 kg)  01/18/22 208 lb 6.4 oz (94.5 kg)    Physical Exam Vitals and nursing note reviewed.  Constitutional:      General: He is not in acute distress.    Appearance: Normal appearance. He is normal weight. He is not ill-appearing, toxic-appearing or diaphoretic.  HENT:     Head: Normocephalic.     Right Ear: Tympanic membrane, ear canal and external ear normal.     Left Ear: Tympanic membrane, ear canal and external ear normal.     Nose: Nose normal. No congestion or rhinorrhea.     Mouth/Throat:     Mouth: Mucous membranes are moist.  Eyes:     General:        Right eye: No discharge.        Left eye: No discharge.     Extraocular Movements: Extraocular movements intact.     Conjunctiva/sclera: Conjunctivae normal.     Pupils: Pupils are equal, round, and reactive to light.  Cardiovascular:     Rate and Rhythm: Normal rate and regular rhythm.     Heart sounds: No murmur heard. Pulmonary:     Effort:  Pulmonary effort is normal. No respiratory distress.     Breath sounds: Normal breath sounds. No wheezing, rhonchi or rales.  Abdominal:     General: Abdomen is  flat. Bowel sounds are normal. There is no distension.     Palpations: Abdomen is soft.     Tenderness: There is no abdominal tenderness. There is no guarding.  Musculoskeletal:     Cervical back: Normal range of motion and neck supple.  Skin:    General: Skin is warm and dry.     Capillary Refill: Capillary refill takes less than 2 seconds.  Neurological:     General: No focal deficit present.     Mental Status: He is alert and oriented to person, place, and time.     Cranial Nerves: No cranial nerve deficit.     Motor: No weakness.     Deep Tendon Reflexes: Reflexes normal.  Psychiatric:        Mood and Affect: Mood normal.        Behavior: Behavior normal.        Thought Content: Thought content normal.        Judgment: Judgment normal.     Results for orders placed or performed in visit on 02/09/22  Comp Met (CMET)  Result Value Ref Range   Glucose 97 70 - 99 mg/dL   BUN 14 8 - 27 mg/dL   Creatinine, Ser 0.71 (L) 0.76 - 1.27 mg/dL   eGFR 97 >59 mL/min/1.73   BUN/Creatinine Ratio 20 10 - 24   Sodium 141 134 - 144 mmol/L   Potassium 4.3 3.5 - 5.2 mmol/L   Chloride 102 96 - 106 mmol/L   CO2 24 20 - 29 mmol/L   Calcium 9.0 8.6 - 10.2 mg/dL   Total Protein 6.6 6.0 - 8.5 g/dL   Albumin 4.4 3.8 - 4.8 g/dL   Globulin, Total 2.2 1.5 - 4.5 g/dL   Albumin/Globulin Ratio 2.0 1.2 - 2.2   Bilirubin Total 0.4 0.0 - 1.2 mg/dL   Alkaline Phosphatase 101 44 - 121 IU/L   AST 21 0 - 40 IU/L   ALT 18 0 - 44 IU/L  Lipid Profile  Result Value Ref Range   Cholesterol, Total 141 100 - 199 mg/dL   Triglycerides 120 0 - 149 mg/dL   HDL 41 >39 mg/dL   VLDL Cholesterol Cal 22 5 - 40 mg/dL   LDL Chol Calc (NIH) 78 0 - 99 mg/dL   Chol/HDL Ratio 3.4 0.0 - 5.0 ratio  HgB A1c  Result Value Ref Range   Hgb A1c MFr Bld 5.7 (H) 4.8 - 5.6 %   Est. average glucose Bld gHb Est-mCnc 117 mg/dL  Microalbumin, Urine Waived  Result Value Ref Range   Microalb, Ur Waived 30 (H) 0 - 19 mg/L    Creatinine, Urine Waived 100 10 - 300 mg/dL   Microalb/Creat Ratio <30 <30 mg/g      Assessment & Plan:   Problem List Items Addressed This Visit       Cardiovascular and Mediastinum   Paroxysmal atrial flutter (HCC)    Chronic.  Controlled.  Continue with current medication regimen of Cardizem.  Labs ordered today.  Return to clinic in 6 months for reevaluation.  Call sooner if concerns arise.        Relevant Medications   atorvastatin (LIPITOR) 80 MG tablet   diltiazem (CARDIZEM CD) 240 MG 24 hr capsule   Hypertension associated with diabetes ([place])  Chronic.  Controlled.  Continue with current medication regimen of Cardizem.  Labs ordered today.  Return to clinic in 6 months for reevaluation.  Call sooner if concerns arise.        Relevant Medications   atorvastatin (LIPITOR) 80 MG tablet   diltiazem (CARDIZEM CD) 240 MG 24 hr capsule   metFORMIN (GLUCOPHAGE) 500 MG tablet     Endocrine   Hyperlipidemia associated with type 2 diabetes mellitus (HCC)    Chronic.  Controlled.  Continue with current medication regimen of Atorvastatin 80mg  daily.  Refills sent today.  Labs ordered today.  Return to clinic in 6 months for reevaluation.  Call sooner if concerns arise.        Relevant Medications   atorvastatin (LIPITOR) 80 MG tablet   diltiazem (CARDIZEM CD) 240 MG 24 hr capsule   metFORMIN (GLUCOPHAGE) 500 MG tablet   Other Relevant Orders   Lipid panel   Controlled diabetes mellitus type 2 with complications (HCC)    Chronic.  Controlled.  Last A1c was 5.6.  Continue with current medication regimen of Metformin 500mg  daily.  Refill sent today.  Labs ordered today.  Microalbumin up to date.  Has eye exam scheduled for April 3.  Return to clinic in 6 months for reevaluation.  Call sooner if concerns arise.        Relevant Medications   atorvastatin (LIPITOR) 80 MG tablet   metFORMIN (GLUCOPHAGE) 500 MG tablet   Other Relevant Orders   HgB A1c     Other   Macular  degeneration, wet (HCC)    Chronic.  Left eye only at this time.  Followed by [facility].      Other Visit Diagnoses     Annual physical exam    -  Primary   Health maintenance reviewed during visit today.  Labs ordered.  Vaccines up to date.  Colonoscopy up to date.   Relevant Orders   TSH   PSA   Lipid panel   CBC with Differential/Platelet   Comprehensive metabolic panel   Urinalysis, Routine w reflex microscopic   HgB A1c   Mixed hyperlipidemia       Relevant Medications   atorvastatin (LIPITOR) 80 MG tablet   diltiazem (CARDIZEM CD) 240 MG 24 hr capsule        Discussed aspirin prophylaxis for myocardial infarction prevention and decision was it was not indicated  LABORATORY TESTING:  Health maintenance labs ordered today as discussed above.   The natural history of prostate cancer and ongoing controversy regarding screening and potential treatment outcomes of prostate cancer has been discussed with the patient. The meaning of a false positive PSA and a false negative PSA has been discussed. He indicates understanding of the limitations of this screening test and wishes to proceed with screening PSA testing.   IMMUNIZATIONS:   - Tdap: Tetanus vaccination status reviewed: last tetanus booster within 10 years. - Influenza: Up to date - Pneumovax: Up to date - Prevnar: Up to date - COVID: Up to date - HPV: Not applicable - Shingrix vaccine: Up to date  SCREENING: - Colonoscopy: Up to date  Discussed with patient purpose of the colonoscopy is to detect colon cancer at curable precancerous or early stages   - AAA Screening: Not applicable  -Hearing Test: Not applicable  -Spirometry: Not applicable   PATIENT COUNSELING:    Sexuality: Discussed sexually transmitted diseases, partner selection, use of condoms, avoidance of unintended pregnancy  and contraceptive alternatives.  Advised to avoid cigarette smoking.  I discussed with the patient that most people  either abstain from alcohol or drink within safe limits (<=14/week and <=4 drinks/occasion for males, <=7/weeks and <= 3 drinks/occasion for females) and that the risk for alcohol disorders and other health effects rises proportionally with the number of drinks per week and how often a drinker exceeds daily limits.  Discussed cessation/primary prevention of drug use and availability of treatment for abuse.   Diet: Encouraged to adjust caloric intake to maintain  or achieve ideal body weight, to reduce intake of dietary saturated fat and total fat, to limit sodium intake by avoiding high sodium foods and not adding table salt, and to maintain adequate dietary potassium and calcium preferably from fresh fruits, vegetables, and low-fat dairy products.    stressed the importance of regular exercise  Injury prevention: Discussed safety belts, safety helmets, smoke detector, smoking near bedding or upholstery.   Dental health: Discussed importance of regular tooth brushing, flossing, and dental visits.   Follow up plan: NEXT PREVENTATIVE PHYSICAL DUE IN 1 YEAR. Return in about 6 months (around 02/24/2023) for HTN, HLD, DM2 FU.

## 2022-08-25 LAB — CBC WITH DIFFERENTIAL/PLATELET
Basophils Absolute: 0 10*3/uL (ref 0.0–0.2)
Basos: 1 %
EOS (ABSOLUTE): 0.1 10*3/uL (ref 0.0–0.4)
Eos: 2 %
Hematocrit: 45.3 % (ref 37.5–51.0)
Hemoglobin: 15.1 g/dL (ref 13.0–17.7)
Immature Grans (Abs): 0 10*3/uL (ref 0.0–0.1)
Immature Granulocytes: 0 %
Lymphocytes Absolute: 1.4 10*3/uL (ref 0.7–3.1)
Lymphs: 28 %
MCH: 31.2 pg (ref 26.6–33.0)
MCHC: 33.3 g/dL (ref 31.5–35.7)
MCV: 94 fL (ref 79–97)
Monocytes Absolute: 0.6 10*3/uL (ref 0.1–0.9)
Monocytes: 11 %
Neutrophils Absolute: 3 10*3/uL (ref 1.4–7.0)
Neutrophils: 58 %
Platelets: 179 10*3/uL (ref 150–450)
RBC: 4.84 x10E6/uL (ref 4.14–5.80)
RDW: 11.6 % (ref 11.6–15.4)
WBC: 5.1 10*3/uL (ref 3.4–10.8)

## 2022-08-25 LAB — COMPREHENSIVE METABOLIC PANEL
ALT: 18 IU/L (ref 0–44)
AST: 20 IU/L (ref 0–40)
Albumin/Globulin Ratio: 1.7 (ref 1.2–2.2)
Albumin: 4.4 g/dL (ref 3.8–4.8)
Alkaline Phosphatase: 125 IU/L — ABNORMAL HIGH (ref 44–121)
BUN/Creatinine Ratio: 13 (ref 10–24)
BUN: 10 mg/dL (ref 8–27)
Bilirubin Total: 0.5 mg/dL (ref 0.0–1.2)
CO2: 24 mmol/L (ref 20–29)
Calcium: 9.4 mg/dL (ref 8.6–10.2)
Chloride: 101 mmol/L (ref 96–106)
Creatinine, Ser: 0.77 mg/dL (ref 0.76–1.27)
Globulin, Total: 2.6 g/dL (ref 1.5–4.5)
Glucose: 107 mg/dL — ABNORMAL HIGH (ref 70–99)
Potassium: 4.2 mmol/L (ref 3.5–5.2)
Sodium: 141 mmol/L (ref 134–144)
Total Protein: 7 g/dL (ref 6.0–8.5)
eGFR: 95 mL/min/{1.73_m2} (ref >59)

## 2022-08-25 LAB — PSA: Prostate Specific Ag, Serum: 0.7 ng/mL (ref 0.0–4.0)

## 2022-08-25 LAB — LIPID PANEL
Chol/HDL Ratio: 4.2 ratio (ref 0.0–5.0)
Cholesterol, Total: 173 mg/dL (ref 100–199)
HDL: 41 mg/dL (ref >39)
LDL Chol Calc (NIH): 104 mg/dL — ABNORMAL HIGH (ref 0–99)
Triglycerides: 156 mg/dL — ABNORMAL HIGH (ref 0–149)
VLDL Cholesterol Cal: 28 mg/dL (ref 5–40)

## 2022-08-25 LAB — HEMOGLOBIN A1C
Est. average glucose Bld gHb Est-mCnc: 128 mg/dL
Hgb A1c MFr Bld: 6.1 % — ABNORMAL HIGH (ref 4.8–5.6)

## 2022-08-25 LAB — TSH: TSH: 1.36 u[IU]/mL (ref 0.450–4.500)

## 2022-08-27 NOTE — Progress Notes (Signed)
Hi Randy Harper. It was nice to see you last week.  Your lab work looks good.  Cholesterol is a little bit elevated from prior.  I recommend a low fat diet and exercise.  Your A1c is well controlled at 6.1%.  No concerns at this time. Continue with your current medication regimen.  Follow up as discussed.  Please let me know if you have any questions.

## 2022-09-12 DIAGNOSIS — E119 Type 2 diabetes mellitus without complications: Secondary | ICD-10-CM | POA: Diagnosis not present

## 2022-09-12 DIAGNOSIS — H401121 Primary open-angle glaucoma, left eye, mild stage: Secondary | ICD-10-CM | POA: Diagnosis not present

## 2022-09-12 DIAGNOSIS — H35712 Central serous chorioretinopathy, left eye: Secondary | ICD-10-CM | POA: Diagnosis not present

## 2022-09-12 DIAGNOSIS — H2513 Age-related nuclear cataract, bilateral: Secondary | ICD-10-CM | POA: Diagnosis not present

## 2022-09-12 DIAGNOSIS — H353221 Exudative age-related macular degeneration, left eye, with active choroidal neovascularization: Secondary | ICD-10-CM | POA: Diagnosis not present

## 2022-09-12 LAB — HM DIABETES EYE EXAM

## 2022-09-13 ENCOUNTER — Encounter: Payer: Self-pay | Admitting: Nurse Practitioner

## 2022-10-03 DIAGNOSIS — H43813 Vitreous degeneration, bilateral: Secondary | ICD-10-CM | POA: Diagnosis not present

## 2022-10-03 DIAGNOSIS — H353112 Nonexudative age-related macular degeneration, right eye, intermediate dry stage: Secondary | ICD-10-CM | POA: Diagnosis not present

## 2022-10-03 DIAGNOSIS — E119 Type 2 diabetes mellitus without complications: Secondary | ICD-10-CM | POA: Diagnosis not present

## 2022-10-03 DIAGNOSIS — H353221 Exudative age-related macular degeneration, left eye, with active choroidal neovascularization: Secondary | ICD-10-CM | POA: Diagnosis not present

## 2022-10-12 DIAGNOSIS — E119 Type 2 diabetes mellitus without complications: Secondary | ICD-10-CM | POA: Diagnosis not present

## 2022-10-12 DIAGNOSIS — I4892 Unspecified atrial flutter: Secondary | ICD-10-CM | POA: Diagnosis not present

## 2022-10-12 DIAGNOSIS — I700 Atherosclerosis of aorta: Secondary | ICD-10-CM | POA: Diagnosis not present

## 2022-10-12 DIAGNOSIS — E782 Mixed hyperlipidemia: Secondary | ICD-10-CM | POA: Diagnosis not present

## 2022-10-12 DIAGNOSIS — I10 Essential (primary) hypertension: Secondary | ICD-10-CM | POA: Diagnosis not present

## 2022-10-23 DIAGNOSIS — H2512 Age-related nuclear cataract, left eye: Secondary | ICD-10-CM | POA: Diagnosis not present

## 2022-10-23 DIAGNOSIS — H2513 Age-related nuclear cataract, bilateral: Secondary | ICD-10-CM | POA: Diagnosis not present

## 2022-10-23 DIAGNOSIS — H353111 Nonexudative age-related macular degeneration, right eye, early dry stage: Secondary | ICD-10-CM | POA: Diagnosis not present

## 2022-10-23 DIAGNOSIS — H18413 Arcus senilis, bilateral: Secondary | ICD-10-CM | POA: Diagnosis not present

## 2022-10-23 DIAGNOSIS — H353221 Exudative age-related macular degeneration, left eye, with active choroidal neovascularization: Secondary | ICD-10-CM | POA: Diagnosis not present

## 2022-10-23 DIAGNOSIS — H40042 Steroid responder, left eye: Secondary | ICD-10-CM | POA: Diagnosis not present

## 2022-10-30 DIAGNOSIS — R911 Solitary pulmonary nodule: Secondary | ICD-10-CM | POA: Diagnosis not present

## 2022-10-30 DIAGNOSIS — C7B8 Other secondary neuroendocrine tumors: Secondary | ICD-10-CM | POA: Diagnosis not present

## 2022-10-30 DIAGNOSIS — C7A8 Other malignant neuroendocrine tumors: Secondary | ICD-10-CM | POA: Diagnosis not present

## 2022-11-02 DIAGNOSIS — M5136 Other intervertebral disc degeneration, lumbar region: Secondary | ICD-10-CM | POA: Diagnosis not present

## 2022-11-02 DIAGNOSIS — M533 Sacrococcygeal disorders, not elsewhere classified: Secondary | ICD-10-CM | POA: Diagnosis not present

## 2022-11-02 DIAGNOSIS — I4892 Unspecified atrial flutter: Secondary | ICD-10-CM | POA: Diagnosis not present

## 2022-11-06 ENCOUNTER — Telehealth: Payer: Self-pay | Admitting: Nurse Practitioner

## 2022-11-06 DIAGNOSIS — E1159 Type 2 diabetes mellitus with other circulatory complications: Secondary | ICD-10-CM

## 2022-11-06 NOTE — Telephone Encounter (Signed)
Pt is requesting to have his diltiazem (CARDIZEM CD) 240 MG 24 hr capsule switched to the pharmacy listed below  [facility][id number] Nicholes Rough,  - [address] AT [facility] [address]  [address] [place] [address]  Phone: [phone] Fax: [phone]

## 2022-11-20 DIAGNOSIS — M533 Sacrococcygeal disorders, not elsewhere classified: Secondary | ICD-10-CM | POA: Diagnosis not present

## 2022-11-20 DIAGNOSIS — M47816 Spondylosis without myelopathy or radiculopathy, lumbar region: Secondary | ICD-10-CM | POA: Diagnosis not present

## 2022-11-23 DIAGNOSIS — M47816 Spondylosis without myelopathy or radiculopathy, lumbar region: Secondary | ICD-10-CM | POA: Diagnosis not present

## 2022-11-23 DIAGNOSIS — M533 Sacrococcygeal disorders, not elsewhere classified: Secondary | ICD-10-CM | POA: Diagnosis not present

## 2022-11-26 DIAGNOSIS — I4892 Unspecified atrial flutter: Secondary | ICD-10-CM | POA: Diagnosis not present

## 2022-11-26 DIAGNOSIS — M533 Sacrococcygeal disorders, not elsewhere classified: Secondary | ICD-10-CM | POA: Diagnosis not present

## 2022-11-26 DIAGNOSIS — I10 Essential (primary) hypertension: Secondary | ICD-10-CM | POA: Diagnosis not present

## 2022-11-26 DIAGNOSIS — M47816 Spondylosis without myelopathy or radiculopathy, lumbar region: Secondary | ICD-10-CM | POA: Diagnosis not present

## 2022-11-26 DIAGNOSIS — E119 Type 2 diabetes mellitus without complications: Secondary | ICD-10-CM | POA: Diagnosis not present

## 2022-11-26 DIAGNOSIS — E782 Mixed hyperlipidemia: Secondary | ICD-10-CM | POA: Diagnosis not present

## 2022-11-26 DIAGNOSIS — I700 Atherosclerosis of aorta: Secondary | ICD-10-CM | POA: Diagnosis not present

## 2022-11-29 DIAGNOSIS — M533 Sacrococcygeal disorders, not elsewhere classified: Secondary | ICD-10-CM | POA: Diagnosis not present

## 2022-11-29 DIAGNOSIS — M47816 Spondylosis without myelopathy or radiculopathy, lumbar region: Secondary | ICD-10-CM | POA: Diagnosis not present

## 2022-12-03 DIAGNOSIS — M47816 Spondylosis without myelopathy or radiculopathy, lumbar region: Secondary | ICD-10-CM | POA: Diagnosis not present

## 2022-12-03 DIAGNOSIS — M533 Sacrococcygeal disorders, not elsewhere classified: Secondary | ICD-10-CM | POA: Diagnosis not present

## 2022-12-06 DIAGNOSIS — M47816 Spondylosis without myelopathy or radiculopathy, lumbar region: Secondary | ICD-10-CM | POA: Diagnosis not present

## 2022-12-06 DIAGNOSIS — M533 Sacrococcygeal disorders, not elsewhere classified: Secondary | ICD-10-CM | POA: Diagnosis not present

## 2022-12-07 DIAGNOSIS — C7B8 Other secondary neuroendocrine tumors: Secondary | ICD-10-CM | POA: Diagnosis not present

## 2022-12-07 DIAGNOSIS — C787 Secondary malignant neoplasm of liver and intrahepatic bile duct: Secondary | ICD-10-CM | POA: Diagnosis not present

## 2022-12-07 DIAGNOSIS — D3A8 Other benign neuroendocrine tumors: Secondary | ICD-10-CM | POA: Diagnosis not present

## 2022-12-07 DIAGNOSIS — C7A8 Other malignant neuroendocrine tumors: Secondary | ICD-10-CM | POA: Diagnosis not present

## 2022-12-07 DIAGNOSIS — Z9049 Acquired absence of other specified parts of digestive tract: Secondary | ICD-10-CM | POA: Diagnosis not present

## 2022-12-07 DIAGNOSIS — R911 Solitary pulmonary nodule: Secondary | ICD-10-CM | POA: Diagnosis not present

## 2022-12-08 ENCOUNTER — Other Ambulatory Visit: Payer: Self-pay | Admitting: Nurse Practitioner

## 2022-12-08 DIAGNOSIS — E1159 Type 2 diabetes mellitus with other circulatory complications: Secondary | ICD-10-CM

## 2022-12-10 DIAGNOSIS — M47816 Spondylosis without myelopathy or radiculopathy, lumbar region: Secondary | ICD-10-CM | POA: Diagnosis not present

## 2022-12-10 DIAGNOSIS — M533 Sacrococcygeal disorders, not elsewhere classified: Secondary | ICD-10-CM | POA: Diagnosis not present

## 2022-12-11 NOTE — Telephone Encounter (Signed)
Unable to refill per protocol, Rx request is too soon. Last refill 08/24/22 for 90 days and 1 refill.  Requested Prescriptions  Pending Prescriptions Disp Refills   metFORMIN (GLUCOPHAGE) 500 MG tablet [Pharmacy Med Name: metFORMIN HCl 500 MG Oral Tablet] 100 tablet 2    Sig: TAKE 1 TABLET BY MOUTH DAILY  WITH BREAKFAST     Endocrinology:  Diabetes - Biguanides Failed - 12/08/2022 10:44 PM      Failed - B12 Level in normal range and within 720 days    No results found for: "VITAMINB12"       Passed - Cr in normal range and within 360 days    Creatinine, Ser  Date Value Ref Range Status  08/24/2022 0.77 0.76 - 1.27 mg/dL Final         Passed - HBA1C is between 0 and 7.9 and within 180 days    Hemoglobin A1C  Date Value Ref Range Status  01/19/2016 6.3  Final   HB A1C (BAYER DCA - WAIVED)  Date Value Ref Range Status  01/14/2019 5.6 <7.0 % Final    Comment:                                          Diabetic Adult            <7.0                                       Healthy Adult        4.3 - 5.7                                                           (DCCT/NGSP) American Diabetes Association's Summary of Glycemic Recommendations for Adults with Diabetes: Hemoglobin A1c <7.0%. More stringent glycemic goals (A1c <6.0%) may further reduce complications at the cost of increased risk of hypoglycemia.    Hgb A1c MFr Bld  Date Value Ref Range Status  08/24/2022 6.1 (H) 4.8 - 5.6 % Final    Comment:             Prediabetes: 5.7 - 6.4          Diabetes: >6.4          Glycemic control for adults with diabetes: <7.0          Passed - eGFR in normal range and within 360 days    GFR calc Af Amer  Date Value Ref Range Status  02/04/2020 110 >59 mL/min/1.73 Final    Comment:    **[facility] currently reports eGFR in compliance with the current**   recommendations of the [employer]. [facility] will   update reporting as new guidelines are published from the NKF-ASN    Task force.    GFR, Estimated  Date Value Ref Range Status  12/04/2021 >60 >60 mL/min Final    Comment:    (NOTE) Calculated using the CKD-EPI Creatinine Equation (2021)    eGFR  Date Value Ref Range Status  08/24/2022 95 >59 mL/min/1.73 Final         Passed - Valid encounter within last  6 months    Recent Outpatient Visits           3 months ago Annual physical exam   [place] [facility] Larae Grooms, NP   10 months ago Paroxysmal atrial flutter [facility])   [place] [facility] Larae Grooms, NP   1 year ago Hospital discharge follow-up   [place] [facility] Mecum, Oswaldo Conroy, PA-C   1 year ago Annual physical exam   [place] [facility] Larae Grooms, NP   1 year ago Right calf pain   [place] [facility] McElwee, Lauren A, NP       Future Appointments             In 2 months Mecum, Oswaldo Conroy, PA-C [place] [facility]            Passed - CBC within normal limits and completed in the last 12 months    WBC  Date Value Ref Range Status  08/24/2022 5.1 3.4 - 10.8 x10E3/uL Final  12/04/2021 5.3 4.0 - 10.5 K/uL Final   RBC  Date Value Ref Range Status  08/24/2022 4.84 4.14 - 5.80 x10E6/uL Final  12/04/2021 3.59 (L) 4.22 - 5.81 MIL/uL Final   Hemoglobin  Date Value Ref Range Status  08/24/2022 15.1 13.0 - 17.7 g/dL Final   Hematocrit  Date Value Ref Range Status  08/24/2022 45.3 37.5 - 51.0 % Final   MCHC  Date Value Ref Range Status  08/24/2022 33.3 31.5 - 35.7 g/dL Final  81/19/1478 29.5 30.0 - 36.0 g/dL Final   [facility]  Date Value Ref Range Status  08/24/2022 31.2 26.6 - 33.0 pg Final  12/04/2021 30.6 26.0 - 34.0 pg Final   MCV  Date Value Ref Range Status  08/24/2022 94 79 - 97 fL Final   No results found for: "PLTCOUNTKUC", "LABPLAT", "POCPLA" RDW  Date Value Ref Range Status  08/24/2022 11.6 11.6 - 15.4 % Final

## 2022-12-12 DIAGNOSIS — M47816 Spondylosis without myelopathy or radiculopathy, lumbar region: Secondary | ICD-10-CM | POA: Diagnosis not present

## 2022-12-12 DIAGNOSIS — M533 Sacrococcygeal disorders, not elsewhere classified: Secondary | ICD-10-CM | POA: Diagnosis not present

## 2022-12-18 DIAGNOSIS — H43393 Other vitreous opacities, bilateral: Secondary | ICD-10-CM | POA: Diagnosis not present

## 2022-12-18 DIAGNOSIS — H353221 Exudative age-related macular degeneration, left eye, with active choroidal neovascularization: Secondary | ICD-10-CM | POA: Diagnosis not present

## 2022-12-18 DIAGNOSIS — H43813 Vitreous degeneration, bilateral: Secondary | ICD-10-CM | POA: Diagnosis not present

## 2022-12-18 DIAGNOSIS — H353112 Nonexudative age-related macular degeneration, right eye, intermediate dry stage: Secondary | ICD-10-CM | POA: Diagnosis not present

## 2022-12-24 DIAGNOSIS — C7A8 Other malignant neuroendocrine tumors: Secondary | ICD-10-CM | POA: Diagnosis not present

## 2022-12-24 DIAGNOSIS — Z01818 Encounter for other preprocedural examination: Secondary | ICD-10-CM | POA: Diagnosis not present

## 2022-12-24 DIAGNOSIS — I10 Essential (primary) hypertension: Secondary | ICD-10-CM | POA: Diagnosis not present

## 2022-12-24 DIAGNOSIS — I4892 Unspecified atrial flutter: Secondary | ICD-10-CM | POA: Diagnosis not present

## 2022-12-24 DIAGNOSIS — C7B8 Other secondary neuroendocrine tumors: Secondary | ICD-10-CM | POA: Diagnosis not present

## 2022-12-24 DIAGNOSIS — I700 Atherosclerosis of aorta: Secondary | ICD-10-CM | POA: Diagnosis not present

## 2022-12-24 DIAGNOSIS — E119 Type 2 diabetes mellitus without complications: Secondary | ICD-10-CM | POA: Diagnosis not present

## 2022-12-24 DIAGNOSIS — R911 Solitary pulmonary nodule: Secondary | ICD-10-CM | POA: Diagnosis not present

## 2022-12-26 DIAGNOSIS — E119 Type 2 diabetes mellitus without complications: Secondary | ICD-10-CM | POA: Diagnosis not present

## 2022-12-26 DIAGNOSIS — I10 Essential (primary) hypertension: Secondary | ICD-10-CM | POA: Diagnosis not present

## 2022-12-26 DIAGNOSIS — I4892 Unspecified atrial flutter: Secondary | ICD-10-CM | POA: Diagnosis not present

## 2022-12-26 DIAGNOSIS — R0989 Other specified symptoms and signs involving the circulatory and respiratory systems: Secondary | ICD-10-CM | POA: Diagnosis not present

## 2022-12-26 DIAGNOSIS — Z7984 Long term (current) use of oral hypoglycemic drugs: Secondary | ICD-10-CM | POA: Diagnosis not present

## 2022-12-26 DIAGNOSIS — K219 Gastro-esophageal reflux disease without esophagitis: Secondary | ICD-10-CM | POA: Diagnosis not present

## 2022-12-26 DIAGNOSIS — I700 Atherosclerosis of aorta: Secondary | ICD-10-CM | POA: Diagnosis not present

## 2022-12-26 DIAGNOSIS — Z86711 Personal history of pulmonary embolism: Secondary | ICD-10-CM | POA: Diagnosis not present

## 2022-12-26 DIAGNOSIS — Z79899 Other long term (current) drug therapy: Secondary | ICD-10-CM | POA: Diagnosis not present

## 2022-12-26 DIAGNOSIS — R911 Solitary pulmonary nodule: Secondary | ICD-10-CM | POA: Diagnosis not present

## 2022-12-31 DIAGNOSIS — M5136 Other intervertebral disc degeneration, lumbar region: Secondary | ICD-10-CM | POA: Diagnosis not present

## 2023-01-16 DIAGNOSIS — R911 Solitary pulmonary nodule: Secondary | ICD-10-CM | POA: Diagnosis not present

## 2023-01-16 DIAGNOSIS — D3A8 Other benign neuroendocrine tumors: Secondary | ICD-10-CM | POA: Diagnosis not present

## 2023-01-16 DIAGNOSIS — C7951 Secondary malignant neoplasm of bone: Secondary | ICD-10-CM | POA: Diagnosis not present

## 2023-01-17 DIAGNOSIS — M533 Sacrococcygeal disorders, not elsewhere classified: Secondary | ICD-10-CM | POA: Diagnosis not present

## 2023-01-17 DIAGNOSIS — M5441 Lumbago with sciatica, right side: Secondary | ICD-10-CM | POA: Diagnosis not present

## 2023-01-17 DIAGNOSIS — G8929 Other chronic pain: Secondary | ICD-10-CM | POA: Diagnosis not present

## 2023-01-17 DIAGNOSIS — M5442 Lumbago with sciatica, left side: Secondary | ICD-10-CM | POA: Diagnosis not present

## 2023-01-18 DIAGNOSIS — C7B8 Other secondary neuroendocrine tumors: Secondary | ICD-10-CM | POA: Diagnosis not present

## 2023-01-18 DIAGNOSIS — C7951 Secondary malignant neoplasm of bone: Secondary | ICD-10-CM | POA: Diagnosis not present

## 2023-01-28 DIAGNOSIS — G893 Neoplasm related pain (acute) (chronic): Secondary | ICD-10-CM | POA: Diagnosis not present

## 2023-01-28 DIAGNOSIS — C7951 Secondary malignant neoplasm of bone: Secondary | ICD-10-CM | POA: Diagnosis not present

## 2023-01-28 DIAGNOSIS — C7B8 Other secondary neuroendocrine tumors: Secondary | ICD-10-CM | POA: Diagnosis not present

## 2023-01-28 DIAGNOSIS — I10 Essential (primary) hypertension: Secondary | ICD-10-CM | POA: Diagnosis not present

## 2023-01-28 DIAGNOSIS — C7A8 Other malignant neuroendocrine tumors: Secondary | ICD-10-CM | POA: Diagnosis not present

## 2023-01-29 ENCOUNTER — Other Ambulatory Visit: Payer: Self-pay | Admitting: Nurse Practitioner

## 2023-01-29 DIAGNOSIS — I152 Hypertension secondary to endocrine disorders: Secondary | ICD-10-CM

## 2023-01-30 ENCOUNTER — Encounter: Payer: Self-pay | Admitting: Nurse Practitioner

## 2023-01-30 NOTE — Telephone Encounter (Signed)
Requested Prescriptions  Pending Prescriptions Disp Refills   diltiazem (CARDIZEM CD) 240 MG 24 hr capsule [Pharmacy Med Name: DilTIAZem CD CAP 240MG /24HR] 100 capsule 1    Sig: TAKE 1 CAPSULE BY MOUTH DAILY     Cardiovascular: Calcium Channel Blockers 3 Passed - 01/29/2023  5:10 AM      Passed - ALT in normal range and within 360 days    ALT  Date Value Ref Range Status  08/24/2022 18 0 - 44 IU/L Final   ALT (SGPT) Piccolo, Waived  Date Value Ref Range Status  01/14/2019 26 10 - 47 U/L Final         Passed - AST in normal range and within 360 days    AST  Date Value Ref Range Status  08/24/2022 20 0 - 40 IU/L Final   AST (SGOT) Piccolo, Waived  Date Value Ref Range Status  01/14/2019 32 11 - 38 U/L Final         Passed - Cr in normal range and within 360 days    Creatinine, Ser  Date Value Ref Range Status  08/24/2022 0.77 0.76 - 1.27 mg/dL Final         Passed - Last BP in normal range    BP Readings from Last 1 Encounters:  08/24/22 128/69         Passed - Last Heart Rate in normal range    Pulse Readings from Last 1 Encounters:  08/24/22 80         Passed - Valid encounter within last 6 months    Recent Outpatient Visits           5 months ago Annual physical exam   [place] [facility] Larae Grooms, NP   11 months ago Paroxysmal atrial flutter [facility])   [place] [facility] Larae Grooms, NP   1 year ago Hospital discharge follow-up   [place] [facility] Mecum, Oswaldo Conroy, PA-C   1 year ago Annual physical exam   [place] [facility] Larae Grooms, NP   1 year ago Right calf pain   [place] [facility] McElwee, Jake [place], NP       Future Appointments             In 2 weeks Mecum, Oswaldo Conroy, PA-C  [facility], [facility]

## 2023-01-31 DIAGNOSIS — C7951 Secondary malignant neoplasm of bone: Secondary | ICD-10-CM | POA: Diagnosis not present

## 2023-02-05 DIAGNOSIS — C7951 Secondary malignant neoplasm of bone: Secondary | ICD-10-CM | POA: Diagnosis not present

## 2023-02-06 DIAGNOSIS — C7951 Secondary malignant neoplasm of bone: Secondary | ICD-10-CM | POA: Diagnosis not present

## 2023-02-07 DIAGNOSIS — C7951 Secondary malignant neoplasm of bone: Secondary | ICD-10-CM | POA: Diagnosis not present

## 2023-02-08 DIAGNOSIS — C7951 Secondary malignant neoplasm of bone: Secondary | ICD-10-CM | POA: Diagnosis not present

## 2023-02-12 DIAGNOSIS — C7951 Secondary malignant neoplasm of bone: Secondary | ICD-10-CM | POA: Diagnosis not present

## 2023-02-15 DIAGNOSIS — D225 Melanocytic nevi of trunk: Secondary | ICD-10-CM | POA: Diagnosis not present

## 2023-02-15 DIAGNOSIS — D2272 Melanocytic nevi of left lower limb, including hip: Secondary | ICD-10-CM | POA: Diagnosis not present

## 2023-02-15 DIAGNOSIS — D2261 Melanocytic nevi of right upper limb, including shoulder: Secondary | ICD-10-CM | POA: Diagnosis not present

## 2023-02-15 DIAGNOSIS — D2271 Melanocytic nevi of right lower limb, including hip: Secondary | ICD-10-CM | POA: Diagnosis not present

## 2023-02-15 DIAGNOSIS — L814 Other melanin hyperpigmentation: Secondary | ICD-10-CM | POA: Diagnosis not present

## 2023-02-15 DIAGNOSIS — L821 Other seborrheic keratosis: Secondary | ICD-10-CM | POA: Diagnosis not present

## 2023-02-15 DIAGNOSIS — D2262 Melanocytic nevi of left upper limb, including shoulder: Secondary | ICD-10-CM | POA: Diagnosis not present

## 2023-02-18 ENCOUNTER — Ambulatory Visit: Payer: Medicare Other | Admitting: Physician Assistant

## 2023-02-19 ENCOUNTER — Ambulatory Visit: Payer: Medicare Other | Admitting: Physician Assistant

## 2023-02-22 ENCOUNTER — Other Ambulatory Visit: Payer: Self-pay | Admitting: Nurse Practitioner

## 2023-02-22 DIAGNOSIS — E1159 Type 2 diabetes mellitus with other circulatory complications: Secondary | ICD-10-CM

## 2023-02-25 ENCOUNTER — Ambulatory Visit: Payer: Medicare Other | Admitting: Physician Assistant

## 2023-02-25 NOTE — Telephone Encounter (Signed)
Requested Prescriptions  Pending Prescriptions Disp Refills   metFORMIN (GLUCOPHAGE) 500 MG tablet [Pharmacy Med Name: metFORMIN HCl 500 MG Oral Tablet] 90 tablet 0    Sig: TAKE 1 TABLET BY MOUTH DAILY  WITH BREAKFAST     Endocrinology:  Diabetes - Biguanides Failed - 02/22/2023 10:33 PM      Failed - HBA1C is between 0 and 7.9 and within 180 days    Hemoglobin A1C  Date Value Ref Range Status  01/19/2016 6.3  Final   HB A1C (BAYER DCA - WAIVED)  Date Value Ref Range Status  01/14/2019 5.6 <7.0 % Final    Comment:                                          Diabetic Adult            <7.0                                       Healthy Adult        4.3 - 5.7                                                           (DCCT/NGSP) American Diabetes Association's Summary of Glycemic Recommendations for Adults with Diabetes: Hemoglobin A1c <7.0%. More stringent glycemic goals (A1c <6.0%) may further reduce complications at the cost of increased risk of hypoglycemia.    Hgb A1c MFr Bld  Date Value Ref Range Status  08/24/2022 6.1 (H) 4.8 - 5.6 % Final    Comment:             Prediabetes: 5.7 - 6.4          Diabetes: >6.4          Glycemic control for adults with diabetes: <7.0          Failed - B12 Level in normal range and within 720 days    No results found for: "VITAMINB12"       Failed - Valid encounter within last 6 months    Recent Outpatient Visits           6 months ago Annual physical exam   [facility] Larae Grooms, NP   1 year ago Paroxysmal atrial flutter [facility])   [facility] Larae Grooms, NP   1 year ago Hospital discharge follow-up   [place] [facility] Mecum, Oswaldo Conroy, PA-C   1 year ago Annual physical exam   [facility] Larae Grooms, NP   1 year ago Right calf pain    [facility], Lauren A, NP              Passed - Cr in  normal range and within 360 days    Creatinine, Ser  Date Value Ref Range Status  08/24/2022 0.77 0.76 - 1.27 mg/dL Final         Passed - eGFR in normal range and within 360 days    GFR calc Af Amer  Date Value Ref Range Status  02/04/2020  110 >59 mL/min/1.73 Final    Comment:    **[facility] currently reports eGFR in compliance with the current**   recommendations of the [employer]. [facility] will   update reporting as new guidelines are published from the NKF-ASN   Task force.    GFR, Estimated  Date Value Ref Range Status  12/04/2021 >60 >60 mL/min Final    Comment:    (NOTE) Calculated using the CKD-EPI Creatinine Equation (2021)    eGFR  Date Value Ref Range Status  08/24/2022 95 >59 mL/min/1.73 Final         Passed - CBC within normal limits and completed in the last 12 months    WBC  Date Value Ref Range Status  08/24/2022 5.1 3.4 - 10.8 x10E3/uL Final  12/04/2021 5.3 4.0 - 10.5 K/uL Final   RBC  Date Value Ref Range Status  08/24/2022 4.84 4.14 - 5.80 x10E6/uL Final  12/04/2021 3.59 (L) 4.22 - 5.81 MIL/uL Final   Hemoglobin  Date Value Ref Range Status  08/24/2022 15.1 13.0 - 17.7 g/dL Final   Hematocrit  Date Value Ref Range Status  08/24/2022 45.3 37.5 - 51.0 % Final   MCHC  Date Value Ref Range Status  08/24/2022 33.3 31.5 - 35.7 g/dL Final  16/03/9603 54.0 30.0 - 36.0 g/dL Final   [facility]  Date Value Ref Range Status  08/24/2022 31.2 26.6 - 33.0 pg Final  12/04/2021 30.6 26.0 - 34.0 pg Final   MCV  Date Value Ref Range Status  08/24/2022 94 79 - 97 fL Final   No results found for: "PLTCOUNTKUC", "LABPLAT", "POCPLA" RDW  Date Value Ref Range Status  08/24/2022 11.6 11.6 - 15.4 % Final

## 2023-03-04 DIAGNOSIS — C7B8 Other secondary neuroendocrine tumors: Secondary | ICD-10-CM | POA: Diagnosis not present

## 2023-03-11 DIAGNOSIS — C7B8 Other secondary neuroendocrine tumors: Secondary | ICD-10-CM | POA: Diagnosis not present

## 2023-03-19 ENCOUNTER — Encounter: Payer: Self-pay | Admitting: Nurse Practitioner

## 2023-03-19 ENCOUNTER — Ambulatory Visit (INDEPENDENT_AMBULATORY_CARE_PROVIDER_SITE_OTHER): Payer: Medicare Other | Admitting: Nurse Practitioner

## 2023-03-19 VITALS — BP 132/76 | HR 75 | Temp 97.9°F | Wt 209.8 lb

## 2023-03-19 DIAGNOSIS — E785 Hyperlipidemia, unspecified: Secondary | ICD-10-CM

## 2023-03-19 DIAGNOSIS — I4892 Unspecified atrial flutter: Secondary | ICD-10-CM | POA: Diagnosis not present

## 2023-03-19 DIAGNOSIS — E782 Mixed hyperlipidemia: Secondary | ICD-10-CM | POA: Diagnosis not present

## 2023-03-19 DIAGNOSIS — Z0000 Encounter for general adult medical examination without abnormal findings: Secondary | ICD-10-CM | POA: Diagnosis not present

## 2023-03-19 DIAGNOSIS — E1159 Type 2 diabetes mellitus with other circulatory complications: Secondary | ICD-10-CM | POA: Diagnosis not present

## 2023-03-19 DIAGNOSIS — E1169 Type 2 diabetes mellitus with other specified complication: Secondary | ICD-10-CM

## 2023-03-19 DIAGNOSIS — D3A8 Other benign neuroendocrine tumors: Secondary | ICD-10-CM

## 2023-03-19 DIAGNOSIS — I152 Hypertension secondary to endocrine disorders: Secondary | ICD-10-CM

## 2023-03-19 DIAGNOSIS — H353220 Exudative age-related macular degeneration, left eye, stage unspecified: Secondary | ICD-10-CM

## 2023-03-19 DIAGNOSIS — Z7189 Other specified counseling: Secondary | ICD-10-CM

## 2023-03-19 DIAGNOSIS — Z7984 Long term (current) use of oral hypoglycemic drugs: Secondary | ICD-10-CM

## 2023-03-19 DIAGNOSIS — E118 Type 2 diabetes mellitus with unspecified complications: Secondary | ICD-10-CM | POA: Diagnosis not present

## 2023-03-19 MED ORDER — ATORVASTATIN CALCIUM 80 MG PO TABS: 80.0000 mg | ORAL_TABLET | Freq: Every day | ORAL | 1 refills | Status: DC

## 2023-03-19 MED ORDER — METFORMIN HCL 500 MG PO TABS: 500.0000 mg | ORAL_TABLET | Freq: Every day | ORAL | 1 refills | Status: DC

## 2023-03-19 NOTE — Assessment & Plan Note (Signed)
Chronic.  Controlled.  Continue with current medication regimen of Atorvastatin 80mg daily. Refills sent today.  Labs ordered today.  Return to clinic in 6 months for reevaluation.  Call sooner if concerns arise.   

## 2023-03-19 NOTE — Assessment & Plan Note (Signed)
Chronic.  Controlled.  Continue with current medication regimen of Cardizem.  Labs ordered today.  Return to clinic in 6 months for reevaluation.  Call sooner if concerns arise.

## 2023-03-19 NOTE — Assessment & Plan Note (Signed)
A voluntary discussion about advance care planning including the explanation and discussion of advance directives was extensively discussed  with the patient for 5 minutes with patient.  Explanation about the health care proxy and Living will was reviewed and packet with forms with explanation of how to fill them out was given.  During this discussion, the patient was able to identify a health care proxy as Randy Harper and plans to bring in paperwork required.  He does not wish to make any changes at this time.  Patient was offered a separate Advance Care Planning visit for further assistance with forms.

## 2023-03-19 NOTE — Assessment & Plan Note (Signed)
Chronic.  Left eye only at this time.  Followed by [facility].

## 2023-03-19 NOTE — Assessment & Plan Note (Signed)
Chronic.  Controlled.  Last A1c was 6.1% in April.  Continue with current medication regimen of Metformin 500mg  daily.  Refill sent today.  Labs ordered today.  Microalbumin checked today.  Eye exam is up to date. Return to clinic in 6 months for reevaluation.  Call sooner if concerns arise.

## 2023-03-19 NOTE — Assessment & Plan Note (Signed)
Relapse.  Working with Oncology for treatment.  Completed 5 rounds of radiation and now receiving hormone injections.

## 2023-03-19 NOTE — Progress Notes (Signed)
BP 132/76   Pulse 75   Temp 97.9 F (36.6 C) (Oral)   Wt 209 lb 12.8 oz (95.2 kg)   SpO2 97%   BMI 30.71 kg/m    Subjective:    Patient ID: Randy Harper, male    DOB: Feb 17, 1951, 72 y.o.   MRN: [id number]  HPI: Randy Harper is a 72 y.o. male presenting on 03/19/2023 for comprehensive medical examination. Current medical complaints include:none  He currently lives with: Interim Problems from his last visit: no  HYPERTENSION / HYPERLIPIDEMIA Satisfied with current treatment? no Duration of hypertension: years BP monitoring frequency: not checking BP range:  BP medication side effects: no Past BP meds: Cardizem Duration of hyperlipidemia: years Cholesterol medication side effects: no Cholesterol supplements: none Past cholesterol medications: atorvastain (lipitor) Medication compliance: excellent compliance Aspirin: no Recent stressors: no Recurrent headaches: no Visual changes: no Palpitations: no Dyspnea: no Chest pain: no Lower extremity edema: no Dizzy/lightheaded: no  DIABETES On Metformin 500mg  daily.   Hypoglycemic episodes:no Polydipsia/polyuria: no Visual disturbance: now on 11 week schedule for injections.- continues with injections. Chest pain: no Paresthesias: no Glucose Monitoring: no  Accucheck frequency: Not Checking  Fasting glucose:  Post prandial:  Evening:  Before meals: Taking Insulin?: no  Long acting insulin:  Short acting insulin: Blood Pressure Monitoring: not checking Retinal Examination: Up to Date Foot Exam: Up to Date Diabetic Education: Not Completed Pneumovax: Up to Date Influenza: Not up to Date Aspirin: no  Patient states his neuroendocrine tumor has returned.  He completed 5 rounds of radiation.  He has now started a hormone injection every 4 weeks until December for treatment.    Functional Status Survey: Is the patient deaf or have difficulty hearing?: No Does the patient have difficulty seeing, even when  wearing glasses/contacts?: No Does the patient have difficulty concentrating, remembering, or making decisions?: No Does the patient have difficulty walking or climbing stairs?: No Does the patient have difficulty dressing or bathing?: No Does the patient have difficulty doing errands alone such as visiting a doctor's office or shopping?: No  FALL RISK:    03/19/2023   11:29 AM 08/24/2022    9:02 AM 02/09/2022    8:23 AM 01/22/2022   10:05 AM 12/19/2021    2:28 PM  Fall Risk   Falls in the past year? 0 0 0 0 0  Number falls in past yr: 0 0 0 0   Injury with Fall? 0 0 0 0   Risk for fall due to : No Fall Risks No Fall Risks No Fall Risks    Follow up Falls evaluation completed Falls evaluation completed Falls evaluation completed Falls evaluation completed;Education provided;Falls prevention discussed     Depression Screen    03/19/2023   11:29 AM 08/24/2022    9:02 AM 02/09/2022    8:24 AM 01/22/2022   10:10 AM 08/09/2021    8:10 AM  Depression screen PHQ 2/9  Decreased Interest 0 0 0 0 0  Down, Depressed, Hopeless 0 0 0 0 0  PHQ - 2 Score 0 0 0 0 0  Altered sleeping 0 0 0  0  Tired, decreased energy 0 0 0  0  Change in appetite 0 0 0  0  Feeling bad or failure about yourself  0 0 0  0  Trouble concentrating 0 0 0  0  Moving slowly or fidgety/restless 0 0 0  0  Suicidal thoughts 0 0 0  0  PHQ-9 Score  0 0 0  0  Difficult doing work/chores Not difficult at all Not difficult at all Not difficult at all  Not difficult at all    Advanced Directives Does patient have a HCPOA?    yes If yes, name and contact information: Randy Harper Does patient have a living will or MOST form?  yes  Past Medical History:  Past Medical History:  Diagnosis Date   Ankylosing spondylitis (HCC) 11/09/2021   Blood transfusion without reported diagnosis 01/2017   Cancer (HCC) 01/2017   metastic neuroendocrine - liver   Diabetes mellitus without complication (HCC)    Dysrhythmia    supraventricular Tach    Glaucoma 2015   Hyperlipidemia    Hypertension    Muscle strain of chest wall    PE (pulmonary thromboembolism) (HCC)    SBO (small bowel obstruction) (HCC) 11/29/2021   Small bowel obstruction (HCC) 11/28/2021   Supraventricular tachycardia (HCC)     Surgical History:  Past Surgical History:  Procedure Laterality Date   CHOLECYSTECTOMY  01/2017   related to liver cancer   COLONOSCOPY     COLONOSCOPY WITH PROPOFOL N/A 03/17/2019   Procedure: COLONOSCOPY WITH PROPOFOL;  Surgeon: Randy Deem, MD;  Location: [facility] ENDOSCOPY;  Service: Endoscopy;  Laterality: N/A;   ESOPHAGOGASTRODUODENOSCOPY N/A 06/06/2021   Procedure: ESOPHAGOGASTRODUODENOSCOPY (EGD);  Surgeon: Randy Spillers, MD;  Location: [facility] ENDOSCOPY;  Service: Endoscopy;  Laterality: N/A;   ESOPHAGOGASTRODUODENOSCOPY (EGD) WITH PROPOFOL N/A 08/22/2021   Procedure: ESOPHAGOGASTRODUODENOSCOPY (EGD) WITH PROPOFOL;  Surgeon: Randy Bill, MD;  Location: [facility] ENDOSCOPY;  Service: Endoscopy;  Laterality: N/A;  DM   LAPAROSCOPY ABDOMEN DIAGNOSTIC     LAPAROTOMY N/A 11/30/2021   Procedure: EXPLORATORY LAPAROTOMY;  Surgeon: Randy Ro, MD;  Location: [facility] ORS;  Service: General;  Laterality: N/A;   LYSIS OF ADHESION N/A 11/30/2021   Procedure: LYSIS OF ADHESION;  Surgeon: Randy Ro, MD;  Location: [facility] ORS;  Service: General;  Laterality: N/A;   resection liver total right lobe  12/2016   SMALL INTESTINE SURGERY  01/2017   cancer surgery   TENDON GRAFT Right    right thumb tendon graft at age 65    VENTRAL HERNIA REPAIR N/A 11/30/2021   Procedure: HERNIA REPAIR VENTRAL ADULT;  Surgeon: Randy Ro, MD;  Location: [facility] ORS;  Service: General;  Laterality: N/A;    Medications:  Current Outpatient Medications on File Prior to Visit  Medication Sig   acetaminophen (TYLENOL) 500 MG tablet Take 500 mg by mouth every 6 (six) hours as needed for mild pain, moderate pain or headache.   Aflibercept 2 MG/0.05ML  SOLN Apply to eye. Every 6 weeks injection   brimonidine (ALPHAGAN) 0.2 % ophthalmic solution Place 1 drop into the left eye daily.    diltiazem (CARDIZEM CD) 240 MG 24 hr capsule TAKE 1 CAPSULE BY MOUTH DAILY   Multiple Vitamin (MULTIVITAMIN) tablet Take 1 tablet by mouth daily.   Multiple Vitamins-Minerals (PRESERVISION AREDS 2+MULTI VIT PO) Take 1 tablet by mouth daily.   pantoprazole (PROTONIX) 40 MG tablet Take 1 tablet (40 mg total) by mouth daily for 28 days.   No current facility-administered medications on file prior to visit.    Allergies:  No Known Allergies  Social History:  Social History   Socioeconomic History   Marital status: Married    Spouse name: Not on file   Number of children: Not on file   Years of education: Not on file   Highest  education level: Professional school degree (e.g., MD, DDS, DVM, JD)  Occupational History   Occupation: retired  Tobacco Use   Smoking status: Never   Smokeless tobacco: Never  Vaping Use   Vaping status: Never Used  Substance and Sexual Activity   Alcohol use: No   Drug use: No   Sexual activity: Yes    Birth control/protection: None  Other Topics Concern   Not on file  Social History Narrative   Not on file   Social Determinants of Health   Financial Resource Strain: Low Risk  (03/18/2023)   Overall Financial Resource Strain (CARDIA)    Difficulty of Paying Living Expenses: Not hard at all  Food Insecurity: No Food Insecurity (03/18/2023)   Hunger Vital Sign    Worried About Running Out of Food in the Last Year: Never true    Ran Out of Food in the Last Year: Never true  Transportation Needs: No Transportation Needs (03/18/2023)   PRAPARE - Administrator, Civil Service (Medical): No    Lack of Transportation (Non-Medical): No  Physical Activity: Insufficiently Active (03/18/2023)   Exercise Vital Sign    Days of Exercise per Week: 3 days    Minutes of Exercise per Session: 30 min  Stress: No Stress  Concern Present (03/18/2023)   [employer] of Occupational Health - Occupational Stress Questionnaire    Feeling of Stress : Not at all  Social Connections: Socially Integrated (03/18/2023)   Social Connection and Isolation Panel [NHANES]    Frequency of Communication with Friends and Family: More than three times a week    Frequency of Social Gatherings with Friends and Family: Twice a week    Attends Religious Services: More than 4 times per year    Active Member of [employer] or Organizations: Yes    Attends Engineer, structural: More than 4 times per year    Marital Status: Married  Catering manager Violence: Not At Risk (01/22/2022)   Humiliation, Afraid, Rape, and Kick questionnaire    Fear of Current or Ex-Partner: No    Emotionally Abused: No    Physically Abused: No    Sexually Abused: No   Social History   Tobacco Use  Smoking Status Never  Smokeless Tobacco Never   Social History   Substance and Sexual Activity  Alcohol Use No    Family History:  Family History  Problem Relation Age of Onset   Heart disease Father    Diabetes Maternal Uncle    Diabetes Maternal Uncle    Diabetes Maternal Uncle    Diabetes Maternal Uncle    Diabetes Maternal Grandmother     Past medical history, surgical history, medications, allergies, family history and social history reviewed with patient today and changes made to appropriate areas of the chart.   Review of Systems  HENT:         Denies vision changes.  Eyes:  Negative for blurred vision and double vision.  Respiratory:  Negative for shortness of breath.   Cardiovascular:  Negative for chest pain, palpitations and leg swelling.  Neurological:  Negative for dizziness, tingling and headaches.  Endo/Heme/Allergies:  Negative for polydipsia.       Denies Polyuria   All other ROS negative except what is listed above and in the HPI.      Objective:    BP 132/76   Pulse 75   Temp 97.9 F (36.6 C) (Oral)   Wt  209 lb 12.8 oz (95.2  kg)   SpO2 97%   BMI 30.71 kg/m   Wt Readings from Last 3 Encounters:  03/19/23 209 lb 12.8 oz (95.2 kg)  08/24/22 214 lb 12.8 oz (97.4 kg)  02/09/22 209 lb 11.2 oz (95.1 kg)    No results found.  Physical Exam Vitals and nursing note reviewed.  Constitutional:      General: He is not in acute distress.    Appearance: Normal appearance. He is not ill-appearing, toxic-appearing or diaphoretic.  HENT:     Head: Normocephalic.     Right Ear: External ear normal.     Left Ear: External ear normal.     Nose: Nose normal. No congestion or rhinorrhea.     Mouth/Throat:     Mouth: Mucous membranes are moist.  Eyes:     General:        Right eye: No discharge.        Left eye: No discharge.     Extraocular Movements: Extraocular movements intact.     Conjunctiva/sclera: Conjunctivae normal.     Pupils: Pupils are equal, round, and reactive to light.  Cardiovascular:     Rate and Rhythm: Normal rate and regular rhythm.     Heart sounds: No murmur heard. Pulmonary:     Effort: Pulmonary effort is normal. No respiratory distress.     Breath sounds: Normal breath sounds. No wheezing, rhonchi or rales.  Abdominal:     General: Abdomen is flat. Bowel sounds are normal.  Musculoskeletal:     Cervical back: Normal range of motion and neck supple.  Skin:    General: Skin is warm and dry.     Capillary Refill: Capillary refill takes less than 2 seconds.  Neurological:     General: No focal deficit present.     Mental Status: He is alert and oriented to person, place, and time.  Psychiatric:        Mood and Affect: Mood normal.        Behavior: Behavior normal.        Thought Content: Thought content normal.        Judgment: Judgment normal.        01/20/2021    9:47 AM 01/18/2020   10:34 AM 01/14/2019   10:08 AM 12/23/2017    9:42 AM 12/19/2016    1:23 PM  6CIT Screen  What Year? 0 points 0 points 0 points 0 points 0 points  What month? 0 points 0 points 0  points 0 points 0 points  What time? 0 points 3 points 0 points 0 points 0 points  Count back from 20 0 points 0 points 0 points 0 points 0 points  Months in reverse 0 points 0 points 0 points 0 points 0 points  Repeat phrase 2 points 0 points 0 points 0 points 2 points  Total Score 2 points 3 points 0 points 0 points 2 points    Cognitive Testing - 6-CIT  Correct? Score   What year is it? yes 0 Yes = 0    No = 4  What month is it? yes 0 Yes = 0    No = 3  Remember:     Fl[place]ins, [address] [place], [place]     What time is it? yes 0 Yes = 0    No = 3  Count backwards from 20 to 1 yes 0 Correct = 0    1 error = 2   More than 1  error = 4  Say the months of the year in reverse. yes 0 Correct = 0    1 error = 2   More than 1 error = 4  What address did I ask you to remember? yes 0 Correct = 0  1 error = 2    2 error = 4    3 error = 6    4 error = 8    All wrong = 10       TOTAL SCORE  0/28   Interpretation:  Normal  Normal (0-7) Abnormal (8-28)    Results for orders placed or performed in visit on 09/13/22  HM DIABETES EYE EXAM  Result Value Ref Range   HM Diabetic Eye Exam No Retinopathy No Retinopathy      Assessment & Plan:   Problem List Items Addressed This Visit       Cardiovascular and Mediastinum   Paroxysmal atrial flutter (HCC)    Chronic.  Controlled.  Continue with current medication regimen of Cardizem.  Labs ordered today.  Return to clinic in 6 months for reevaluation.  Call sooner if concerns arise.        Relevant Medications   atorvastatin (LIPITOR) 80 MG tablet   Hypertension associated with diabetes (HCC)    Chronic.  Controlled.  Continue with current medication regimen of Cardizem.  Labs ordered today.  Return to clinic in 6 months for reevaluation.  Call sooner if concerns arise.        Relevant Medications   atorvastatin (LIPITOR) 80 MG tablet   metFORMIN (GLUCOPHAGE) 500 MG tablet   Other Relevant Orders   Comp Met (CMET)     Endocrine    Hyperlipidemia associated with type 2 diabetes mellitus (HCC)    Chronic.  Controlled.  Continue with current medication regimen of Atorvastatin 80mg  daily.  Refills sent today.  Labs ordered today.  Return to clinic in 6 months for reevaluation.  Call sooner if concerns arise.        Relevant Medications   atorvastatin (LIPITOR) 80 MG tablet   metFORMIN (GLUCOPHAGE) 500 MG tablet   Other Relevant Orders   Lipid Profile   Controlled diabetes mellitus type 2 with complications (HCC)    Chronic.  Controlled.  Last A1c was 6.1% in April.  Continue with current medication regimen of Metformin 500mg  daily.  Refill sent today.  Labs ordered today.  Microalbumin checked today.  Eye exam is up to date. Return to clinic in 6 months for reevaluation.  Call sooner if concerns arise.        Relevant Medications   atorvastatin (LIPITOR) 80 MG tablet   metFORMIN (GLUCOPHAGE) 500 MG tablet   Other Relevant Orders   HgB A1c   Microalbumin, Urine Waived     Other   Advanced care planning/counseling discussion    A voluntary discussion about advance care planning including the explanation and discussion of advance directives was extensively discussed  with the patient for 5 minutes with patient.  Explanation about the health care proxy and Living will was reviewed and packet with forms with explanation of how to fill them out was given.  During this discussion, the patient was able to identify a health care proxy as Randy Regal Abelson and plans to bring in paperwork required.  He does not wish to make any changes at this time.  Patient was offered a separate Advance Care Planning visit for further assistance with forms.  Neuroendocrine tumor    Relapse.  Working with Oncology for treatment.  Completed 5 rounds of radiation and now receiving hormone injections.        Macular degeneration, wet (HCC)    Chronic.  Left eye only at this time.  Followed by [facility].      Other Visit Diagnoses      Encounter for annual wellness exam in Medicare patient    -  Primary   Mixed hyperlipidemia       Relevant Medications   atorvastatin (LIPITOR) 80 MG tablet        Preventative Services:  Health Risk Assessment and Personalized Prevention Plan: Bone Mass Measurements: CVD Screening:  Colon Cancer Screening:  Depression Screening:  Diabetes Screening:  Glaucoma Screening:  Hepatitis B vaccine: Hepatitis C screening:  HIV Screening: Flu Vaccine: Lung cancer Screening: Obesity Screening:  Pneumonia Vaccines (2): STI Screening: PSA screening:  Discussed aspirin prophylaxis for myocardial infarction prevention and decision was it was not indicated  LABORATORY TESTING:  Health maintenance labs ordered today as discussed above.   The natural history of prostate cancer and ongoing controversy regarding screening and potential treatment outcomes of prostate cancer has been discussed with the patient. The meaning of a false positive PSA and a false negative PSA has been discussed. He indicates understanding of the limitations of this screening test and wishes to proceed with screening PSA testing.   IMMUNIZATIONS:   - Tdap: Tetanus vaccination status reviewed: last tetanus booster within 10 years. - Influenza: Up to date - Pneumovax: Up to date - Prevnar: Not applicable - Zostavax vaccine: Up to date  SCREENING: - Colonoscopy: Up to date  Discussed with patient purpose of the colonoscopy is to detect colon cancer at curable precancerous or early stages   - AAA Screening: Not applicable  -Hearing Test: Not applicable  -Spirometry: Not applicable   PATIENT COUNSELING:    Sexuality: Discussed sexually transmitted diseases, partner selection, use of condoms, avoidance of unintended pregnancy  and contraceptive alternatives.   Advised to avoid cigarette smoking.  I discussed with the patient that most people either abstain from alcohol or drink within safe limits  (<=14/week and <=4 drinks/occasion for males, <=7/weeks and <= 3 drinks/occasion for females) and that the risk for alcohol disorders and other health effects rises proportionally with the number of drinks per week and how often a drinker exceeds daily limits.  Discussed cessation/primary prevention of drug use and availability of treatment for abuse.   Diet: Encouraged to adjust caloric intake to maintain  or achieve ideal body weight, to reduce intake of dietary saturated fat and total fat, to limit sodium intake by avoiding high sodium foods and not adding table salt, and to maintain adequate dietary potassium and calcium preferably from fresh fruits, vegetables, and low-fat dairy products.    stressed the importance of regular exercise  Injury prevention: Discussed safety belts, safety helmets, smoke detector, smoking near bedding or upholstery.   Dental health: Discussed importance of regular tooth brushing, flossing, and dental visits.   Follow up plan: NEXT PREVENTATIVE PHYSICAL DUE IN 1 YEAR. Return in about 6 months (around 09/17/2023) for Physical and Fasting labs.

## 2023-03-20 LAB — LIPID PANEL
Chol/HDL Ratio: 2.7 {ratio} (ref 0.0–5.0)
Cholesterol, Total: 128 mg/dL (ref 100–199)
HDL: 48 mg/dL (ref >39)
LDL Chol Calc (NIH): 60 mg/dL (ref 0–99)
Triglycerides: 106 mg/dL (ref 0–149)
VLDL Cholesterol Cal: 20 mg/dL (ref 5–40)

## 2023-03-20 LAB — COMPREHENSIVE METABOLIC PANEL
ALT: 19 [IU]/L (ref 0–44)
AST: 21 [IU]/L (ref 0–40)
Albumin: 4.5 g/dL (ref 3.8–4.8)
Alkaline Phosphatase: 95 [IU]/L (ref 44–121)
BUN/Creatinine Ratio: 20 (ref 10–24)
BUN: 14 mg/dL (ref 8–27)
Bilirubin Total: 0.3 mg/dL (ref 0.0–1.2)
CO2: 24 mmol/L (ref 20–29)
Calcium: 9.3 mg/dL (ref 8.6–10.2)
Chloride: 99 mmol/L (ref 96–106)
Creatinine, Ser: 0.71 mg/dL — ABNORMAL LOW (ref 0.76–1.27)
Globulin, Total: 2.1 g/dL (ref 1.5–4.5)
Glucose: 113 mg/dL — ABNORMAL HIGH (ref 70–99)
Potassium: 4.3 mmol/L (ref 3.5–5.2)
Sodium: 138 mmol/L (ref 134–144)
Total Protein: 6.6 g/dL (ref 6.0–8.5)
eGFR: 96 mL/min/{1.73_m2} (ref >59)

## 2023-03-20 LAB — HEMOGLOBIN A1C
Est. average glucose Bld gHb Est-mCnc: 134 mg/dL
Hgb A1c MFr Bld: 6.3 % — ABNORMAL HIGH (ref 4.8–5.6)

## 2023-03-20 NOTE — Progress Notes (Signed)
HI Randy Harper. It was nice to see you yesterday.  Your lab work looks good.  A1c is well controlled.  No concerns at this time. Continue with your current medication regimen.  Follow up as discussed.  Please let me know if you have any questions.

## 2023-03-26 DIAGNOSIS — H43393 Other vitreous opacities, bilateral: Secondary | ICD-10-CM | POA: Diagnosis not present

## 2023-03-26 DIAGNOSIS — H353112 Nonexudative age-related macular degeneration, right eye, intermediate dry stage: Secondary | ICD-10-CM | POA: Diagnosis not present

## 2023-03-26 DIAGNOSIS — E119 Type 2 diabetes mellitus without complications: Secondary | ICD-10-CM | POA: Diagnosis not present

## 2023-03-26 DIAGNOSIS — H43813 Vitreous degeneration, bilateral: Secondary | ICD-10-CM | POA: Diagnosis not present

## 2023-03-26 DIAGNOSIS — H353221 Exudative age-related macular degeneration, left eye, with active choroidal neovascularization: Secondary | ICD-10-CM | POA: Diagnosis not present

## 2023-04-08 DIAGNOSIS — C7B8 Other secondary neuroendocrine tumors: Secondary | ICD-10-CM | POA: Diagnosis not present

## 2023-04-09 DIAGNOSIS — H2513 Age-related nuclear cataract, bilateral: Secondary | ICD-10-CM | POA: Diagnosis not present

## 2023-04-09 DIAGNOSIS — H25013 Cortical age-related cataract, bilateral: Secondary | ICD-10-CM | POA: Diagnosis not present

## 2023-04-09 DIAGNOSIS — H2512 Age-related nuclear cataract, left eye: Secondary | ICD-10-CM | POA: Diagnosis not present

## 2023-04-09 DIAGNOSIS — H25043 Posterior subcapsular polar age-related cataract, bilateral: Secondary | ICD-10-CM | POA: Diagnosis not present

## 2023-04-09 DIAGNOSIS — H353221 Exudative age-related macular degeneration, left eye, with active choroidal neovascularization: Secondary | ICD-10-CM | POA: Diagnosis not present

## 2023-04-09 DIAGNOSIS — H18413 Arcus senilis, bilateral: Secondary | ICD-10-CM | POA: Diagnosis not present

## 2023-04-09 DIAGNOSIS — H353111 Nonexudative age-related macular degeneration, right eye, early dry stage: Secondary | ICD-10-CM | POA: Diagnosis not present

## 2023-05-06 DIAGNOSIS — C7B8 Other secondary neuroendocrine tumors: Secondary | ICD-10-CM | POA: Diagnosis not present

## 2023-05-13 DIAGNOSIS — H2512 Age-related nuclear cataract, left eye: Secondary | ICD-10-CM | POA: Diagnosis not present

## 2023-05-14 DIAGNOSIS — H2511 Age-related nuclear cataract, right eye: Secondary | ICD-10-CM | POA: Diagnosis not present

## 2023-05-14 DIAGNOSIS — H25011 Cortical age-related cataract, right eye: Secondary | ICD-10-CM | POA: Diagnosis not present

## 2023-05-14 DIAGNOSIS — H25041 Posterior subcapsular polar age-related cataract, right eye: Secondary | ICD-10-CM | POA: Diagnosis not present

## 2023-05-22 DIAGNOSIS — H2512 Age-related nuclear cataract, left eye: Secondary | ICD-10-CM | POA: Diagnosis not present

## 2023-05-27 DIAGNOSIS — H2511 Age-related nuclear cataract, right eye: Secondary | ICD-10-CM | POA: Diagnosis not present

## 2023-06-03 DIAGNOSIS — M899 Disorder of bone, unspecified: Secondary | ICD-10-CM | POA: Diagnosis not present

## 2023-06-03 DIAGNOSIS — C7A8 Other malignant neuroendocrine tumors: Secondary | ICD-10-CM | POA: Diagnosis not present

## 2023-06-03 DIAGNOSIS — R918 Other nonspecific abnormal finding of lung field: Secondary | ICD-10-CM | POA: Diagnosis not present

## 2023-06-03 DIAGNOSIS — C7B8 Other secondary neuroendocrine tumors: Secondary | ICD-10-CM | POA: Diagnosis not present

## 2023-06-03 DIAGNOSIS — Z5111 Encounter for antineoplastic chemotherapy: Secondary | ICD-10-CM | POA: Diagnosis not present

## 2023-06-07 DIAGNOSIS — H2511 Age-related nuclear cataract, right eye: Secondary | ICD-10-CM | POA: Diagnosis not present

## 2023-06-17 DIAGNOSIS — C7951 Secondary malignant neoplasm of bone: Secondary | ICD-10-CM | POA: Diagnosis not present

## 2023-07-02 DIAGNOSIS — D3A8 Other benign neuroendocrine tumors: Secondary | ICD-10-CM | POA: Diagnosis not present

## 2023-07-02 DIAGNOSIS — C7B8 Other secondary neuroendocrine tumors: Secondary | ICD-10-CM | POA: Diagnosis not present

## 2023-07-10 ENCOUNTER — Other Ambulatory Visit: Payer: Self-pay | Admitting: Nurse Practitioner

## 2023-07-10 DIAGNOSIS — I152 Hypertension secondary to endocrine disorders: Secondary | ICD-10-CM

## 2023-07-12 NOTE — Telephone Encounter (Signed)
Requested Prescriptions  Pending Prescriptions Disp Refills   diltiazem (CARDIZEM CD) 240 MG 24 hr capsule [Pharmacy Med Name: DilTIAZem CD CAP 240MG /24HR] 100 capsule 0    Sig: TAKE 1 CAPSULE BY MOUTH DAILY     Cardiovascular: Calcium Channel Blockers 3 Failed - 07/12/2023  8:31 AM      Failed - Cr in normal range and within 360 days    Creatinine, Ser  Date Value Ref Range Status  03/19/2023 0.71 (L) 0.76 - 1.27 mg/dL Final         Passed - ALT in normal range and within 360 days    ALT  Date Value Ref Range Status  03/19/2023 19 0 - 44 IU/L Final   ALT (SGPT) Piccolo, Waived  Date Value Ref Range Status  01/14/2019 26 10 - 47 U/L Final         Passed - AST in normal range and within 360 days    AST  Date Value Ref Range Status  03/19/2023 21 0 - 40 IU/L Final   AST (SGOT) Piccolo, Waived  Date Value Ref Range Status  01/14/2019 32 11 - 38 U/L Final         Passed - Last BP in normal range    BP Readings from Last 1 Encounters:  03/19/23 132/76         Passed - Last Heart Rate in normal range    Pulse Readings from Last 1 Encounters:  03/19/23 75         Passed - Valid encounter within last 6 months    Recent Outpatient Visits           3 months ago Encounter for annual wellness exam in Medicare patient   [place] [facility] Larae Grooms, NP   10 months ago Annual physical exam   [place] [facility] Larae Grooms, NP   1 year ago Paroxysmal atrial flutter [facility])   [place] [facility] Larae Grooms, NP   1 year ago Hospital discharge follow-up   [place] [facility], Oswaldo Conroy, PA-C   1 year ago Annual physical exam   [place] [facility] Larae Grooms, NP       Future Appointments             In 2 months Larae Grooms, NP  [facility]

## 2023-07-17 DIAGNOSIS — H353221 Exudative age-related macular degeneration, left eye, with active choroidal neovascularization: Secondary | ICD-10-CM | POA: Diagnosis not present

## 2023-07-17 DIAGNOSIS — H353112 Nonexudative age-related macular degeneration, right eye, intermediate dry stage: Secondary | ICD-10-CM | POA: Diagnosis not present

## 2023-07-17 DIAGNOSIS — H43393 Other vitreous opacities, bilateral: Secondary | ICD-10-CM | POA: Diagnosis not present

## 2023-07-17 DIAGNOSIS — H43813 Vitreous degeneration, bilateral: Secondary | ICD-10-CM | POA: Diagnosis not present

## 2023-07-17 DIAGNOSIS — E119 Type 2 diabetes mellitus without complications: Secondary | ICD-10-CM | POA: Diagnosis not present

## 2023-07-29 DIAGNOSIS — D3A8 Other benign neuroendocrine tumors: Secondary | ICD-10-CM | POA: Diagnosis not present

## 2023-07-29 DIAGNOSIS — C7B8 Other secondary neuroendocrine tumors: Secondary | ICD-10-CM | POA: Diagnosis not present

## 2023-08-12 DIAGNOSIS — R911 Solitary pulmonary nodule: Secondary | ICD-10-CM | POA: Diagnosis not present

## 2023-08-12 DIAGNOSIS — C7B8 Other secondary neuroendocrine tumors: Secondary | ICD-10-CM | POA: Diagnosis not present

## 2023-08-12 DIAGNOSIS — C7A8 Other malignant neuroendocrine tumors: Secondary | ICD-10-CM | POA: Diagnosis not present

## 2023-08-26 DIAGNOSIS — C7B8 Other secondary neuroendocrine tumors: Secondary | ICD-10-CM | POA: Diagnosis not present

## 2023-08-26 DIAGNOSIS — D3A8 Other benign neuroendocrine tumors: Secondary | ICD-10-CM | POA: Diagnosis not present

## 2023-08-26 DIAGNOSIS — C7A8 Other malignant neuroendocrine tumors: Secondary | ICD-10-CM | POA: Diagnosis not present

## 2023-09-17 ENCOUNTER — Encounter: Payer: Self-pay | Admitting: Nurse Practitioner

## 2023-09-17 DIAGNOSIS — E119 Type 2 diabetes mellitus without complications: Secondary | ICD-10-CM | POA: Diagnosis not present

## 2023-09-17 DIAGNOSIS — H353221 Exudative age-related macular degeneration, left eye, with active choroidal neovascularization: Secondary | ICD-10-CM | POA: Diagnosis not present

## 2023-09-17 DIAGNOSIS — H43813 Vitreous degeneration, bilateral: Secondary | ICD-10-CM | POA: Diagnosis not present

## 2023-09-17 DIAGNOSIS — H43393 Other vitreous opacities, bilateral: Secondary | ICD-10-CM | POA: Diagnosis not present

## 2023-09-17 DIAGNOSIS — H353112 Nonexudative age-related macular degeneration, right eye, intermediate dry stage: Secondary | ICD-10-CM | POA: Diagnosis not present

## 2023-09-18 ENCOUNTER — Ambulatory Visit: Payer: Medicare Other | Admitting: Nurse Practitioner

## 2023-09-18 VITALS — BP 114/69 | HR 79 | Temp 97.7°F | Resp 17 | Ht 69.29 in | Wt 226.4 lb

## 2023-09-18 DIAGNOSIS — I152 Hypertension secondary to endocrine disorders: Secondary | ICD-10-CM | POA: Diagnosis not present

## 2023-09-18 DIAGNOSIS — E118 Type 2 diabetes mellitus with unspecified complications: Secondary | ICD-10-CM

## 2023-09-18 DIAGNOSIS — C7B8 Other secondary neuroendocrine tumors: Secondary | ICD-10-CM

## 2023-09-18 DIAGNOSIS — E782 Mixed hyperlipidemia: Secondary | ICD-10-CM

## 2023-09-18 DIAGNOSIS — H353220 Exudative age-related macular degeneration, left eye, stage unspecified: Secondary | ICD-10-CM

## 2023-09-18 DIAGNOSIS — E1169 Type 2 diabetes mellitus with other specified complication: Secondary | ICD-10-CM | POA: Diagnosis not present

## 2023-09-18 DIAGNOSIS — E785 Hyperlipidemia, unspecified: Secondary | ICD-10-CM | POA: Diagnosis not present

## 2023-09-18 DIAGNOSIS — I4892 Unspecified atrial flutter: Secondary | ICD-10-CM | POA: Diagnosis not present

## 2023-09-18 DIAGNOSIS — E1159 Type 2 diabetes mellitus with other circulatory complications: Secondary | ICD-10-CM | POA: Diagnosis not present

## 2023-09-18 DIAGNOSIS — Z7984 Long term (current) use of oral hypoglycemic drugs: Secondary | ICD-10-CM | POA: Diagnosis not present

## 2023-09-18 DIAGNOSIS — Z0000 Encounter for general adult medical examination without abnormal findings: Secondary | ICD-10-CM | POA: Diagnosis not present

## 2023-09-18 LAB — MICROALBUMIN, URINE WAIVED
Creatinine, Urine Waived: 100 mg/dL (ref 10–300)
Microalb, Ur Waived: 150 mg/L — ABNORMAL HIGH (ref 0–19)

## 2023-09-18 MED ORDER — METFORMIN HCL 500 MG PO TABS
500.0000 mg | ORAL_TABLET | Freq: Every day | ORAL | 1 refills | Status: DC
Start: 2023-09-18 — End: 2023-09-19

## 2023-09-18 MED ORDER — DILTIAZEM HCL ER COATED BEADS 240 MG PO CP24
240.0000 mg | ORAL_CAPSULE | Freq: Every day | ORAL | 1 refills | Status: DC
Start: 2023-09-18 — End: 2023-12-23

## 2023-09-18 MED ORDER — ATORVASTATIN CALCIUM 80 MG PO TABS: 80.0000 mg | ORAL_TABLET | Freq: Every day | ORAL | 1 refills | Status: DC

## 2023-09-18 NOTE — Assessment & Plan Note (Signed)
Chronic.  Controlled.  Continue with current medication regimen of Atorvastatin 80mg daily. Refills sent today.  Labs ordered today.  Return to clinic in 6 months for reevaluation.  Call sooner if concerns arise.   

## 2023-09-18 NOTE — Assessment & Plan Note (Signed)
 Chronic.  Controlled.  Continue with current medication regimen of Cardizem.  Refills sent today.  Labs ordered today.  Return to clinic in 6 months for reevaluation.  Call sooner if concerns arise.

## 2023-09-18 NOTE — Progress Notes (Signed)
 BP 114/69 (BP Location: Right Arm, Patient Position: Sitting, Cuff Size: Normal)   Pulse 79   Temp 97.7 F (36.5 C) (Oral)   Resp 17   Ht 5' 9.29" (1.76 m)   Wt 226 lb 6.4 oz (102.7 kg)   SpO2 95%   BMI 33.15 kg/m    Subjective:    Patient ID: Randy Harper, male    DOB: 01-03-1951, 73 y.o.   MRN: [id number]  HPI: Randy Harper is a 73 y.o. male presenting on 09/18/2023 for comprehensive medical examination. Current medical complaints include:none  He currently lives with: Interim Problems from his last visit: no  HYPERTENSION / HYPERLIPIDEMIA Satisfied with current treatment? yes Duration of hypertension: years BP monitoring frequency: not checking BP range:  BP medication side effects: no Past BP meds: cardizem Duration of hyperlipidemia: years Cholesterol medication side effects: no Cholesterol supplements: none Past cholesterol medications: atorvastain (lipitor) Medication compliance: excellent compliance Aspirin: no Recent stressors: no Recurrent headaches: no Visual changes: no Palpitations: no Dyspnea: no Chest pain: no Lower extremity edema: no Dizzy/lightheaded: no  DIABETES Metformin 500mg  Daily- no issues with the medication.  Hypoglycemic episodes:no Polydipsia/polyuria: no Visual disturbance: no Chest pain: no Paresthesias: no Glucose Monitoring: no  Accucheck frequency: Not Checking  Fasting glucose:  Post prandial:  Evening:  Before meals: Taking Insulin?: no  Long acting insulin:  Short acting insulin: Blood Pressure Monitoring: not checking Retinal Examination: Up to Date Foot Exam: Up to Date- Dr. Clydene Pugh Diabetic Education: Not Completed Pneumovax: Up to Date Influenza: Up to Date Aspirin: no  Depression Screen done today and results listed below:     09/18/2023   10:54 AM 03/19/2023   11:29 AM 08/24/2022    9:02 AM 02/09/2022    8:24 AM 01/22/2022   10:10 AM  Depression screen PHQ 2/9  Decreased Interest 0 0 0 0 0  Down,  Depressed, Hopeless 0 0 0 0 0  PHQ - 2 Score 0 0 0 0 0  Altered sleeping 0 0 0 0   Tired, decreased energy 0 0 0 0   Change in appetite 0 0 0 0   Feeling bad or failure about yourself  0 0 0 0   Trouble concentrating 0 0 0 0   Moving slowly or fidgety/restless 0 0 0 0   Suicidal thoughts 0 0 0 0   PHQ-9 Score 0 0 0 0   Difficult doing work/chores Not difficult at all Not difficult at all Not difficult at all Not difficult at all     The patient does not have a history of falls. I did complete a risk assessment for falls. A plan of care for falls was documented.   Past Medical History:  Past Medical History:  Diagnosis Date   Ankylosing spondylitis (HCC) 11/09/2021   Blood transfusion without reported diagnosis 01/2017   Cancer (HCC) 01/2017   metastic neuroendocrine - liver   Cataract 12/2022   Completed surgery   Diabetes mellitus without complication (HCC)    Dysrhythmia    supraventricular Tach   Glaucoma 2015   Hyperlipidemia    Hypertension    Muscle strain of chest wall    PE (pulmonary thromboembolism) (HCC)    SBO (small bowel obstruction) (HCC) 11/29/2021   Small bowel obstruction (HCC) 11/28/2021   Supraventricular tachycardia (HCC)     Surgical History:  Past Surgical History:  Procedure Laterality Date   CHOLECYSTECTOMY  01/2017   related to liver cancer  COLONOSCOPY     COLONOSCOPY WITH PROPOFOL N/A 03/17/2019   Procedure: COLONOSCOPY WITH PROPOFOL;  Surgeon: Christena Deem, MD;  Location: [facility] ENDOSCOPY;  Service: Endoscopy;  Laterality: N/A;   ESOPHAGOGASTRODUODENOSCOPY N/A 06/06/2021   Procedure: ESOPHAGOGASTRODUODENOSCOPY (EGD);  Surgeon: Pasty Spillers, MD;  Location: [facility] ENDOSCOPY;  Service: Endoscopy;  Laterality: N/A;   ESOPHAGOGASTRODUODENOSCOPY (EGD) WITH PROPOFOL N/A 08/22/2021   Procedure: ESOPHAGOGASTRODUODENOSCOPY (EGD) WITH PROPOFOL;  Surgeon: Regis Bill, MD;  Location: [facility] ENDOSCOPY;  Service: Endoscopy;  Laterality:  N/A;  DM   EYE SURGERY  12/24   LAPAROSCOPY ABDOMEN DIAGNOSTIC     LAPAROTOMY N/A 11/30/2021   Procedure: EXPLORATORY LAPAROTOMY;  Surgeon: Leafy Ro, MD;  Location: [facility] ORS;  Service: General;  Laterality: N/A;   LYSIS OF ADHESION N/A 11/30/2021   Procedure: LYSIS OF ADHESION;  Surgeon: Leafy Ro, MD;  Location: [facility] ORS;  Service: General;  Laterality: N/A;   resection liver total right lobe  12/2016   SMALL INTESTINE SURGERY  01/2017   cancer surgery   TENDON GRAFT Right    right thumb tendon graft at age 13    VENTRAL HERNIA REPAIR N/A 11/30/2021   Procedure: HERNIA REPAIR VENTRAL ADULT;  Surgeon: Leafy Ro, MD;  Location: [facility] ORS;  Service: General;  Laterality: N/A;    Medications:  Current Outpatient Medications on File Prior to Visit  Medication Sig   acetaminophen (TYLENOL) 500 MG tablet Take 500 mg by mouth every 6 (six) hours as needed for mild pain, moderate pain or headache.   brimonidine (ALPHAGAN) 0.2 % ophthalmic solution Place 1 drop into the left eye daily.    lanreotide acetate (SOMATULINE DEPOT) 120 MG/0.5ML injection Inject 120 mg into the skin every 28 (twenty-eight) days.   Multiple Vitamin (MULTIVITAMIN) tablet Take 1 tablet by mouth daily.   Multiple Vitamins-Minerals (PRESERVISION AREDS 2+MULTI VIT PO) Take 1 tablet by mouth daily.   Zoledronic Acid (ZOMETA) 4 MG/100ML IVPB Inject 4 mg into the vein every 3 (three) months.   pantoprazole (PROTONIX) 40 MG tablet Take 1 tablet (40 mg total) by mouth daily for 28 days.   No current facility-administered medications on file prior to visit.    Allergies:  No Known Allergies  Social History:  Social History   Socioeconomic History   Marital status: Married    Spouse name: Not on file   Number of children: Not on file   Years of education: Not on file   Highest education level: Master's degree (e.g., MA, MS, MEng, MEd, MSW, MBA)  Occupational History   Occupation: retired  Tobacco Use    Smoking status: Never   Smokeless tobacco: Never  Vaping Use   Vaping status: Never Used  Substance and Sexual Activity   Alcohol use: No   Drug use: No   Sexual activity: Yes    Birth control/protection: None  Other Topics Concern   Not on file  Social History Narrative   Not on file   Social Drivers of Health   Financial Resource Strain: Low Risk  (09/18/2023)   Overall Financial Resource Strain (CARDIA)    Difficulty of Paying Living Expenses: Not hard at all  Food Insecurity: No Food Insecurity (09/18/2023)   Hunger Vital Sign    Worried About Running Out of Food in the Last Year: Never true    Ran Out of Food in the Last Year: Never true  Transportation Needs: No Transportation Needs (09/18/2023)   PRAPARE - Transportation  Lack of Transportation (Medical): No    Lack of Transportation (Non-Medical): No  Physical Activity: Insufficiently Active (09/18/2023)   Exercise Vital Sign    Days of Exercise per Week: 3 days    Minutes of Exercise per Session: 20 min  Stress: No Stress Concern Present (09/18/2023)   [employer] of Occupational Health - Occupational Stress Questionnaire    Feeling of Stress : Not at all  Social Connections: Socially Integrated (09/18/2023)   Social Connection and Isolation Panel [NHANES]    Frequency of Communication with Friends and Family: Three times a week    Frequency of Social Gatherings with Friends and Family: Three times a week    Attends Religious Services: 1 to 4 times per year    Active Member of Clubs or Organizations: Yes    Attends Banker Meetings: 1 to 4 times per year    Marital Status: Married  Catering manager Violence: Not At Risk (01/22/2022)   Humiliation, Afraid, Rape, and Kick questionnaire    Fear of Current or Ex-Partner: No    Emotionally Abused: No    Physically Abused: No    Sexually Abused: No   Social History   Tobacco Use  Smoking Status Never  Smokeless Tobacco Never   Social History    Substance and Sexual Activity  Alcohol Use No    Family History:  Family History  Problem Relation Age of Onset   Heart disease Father    Diabetes Maternal Uncle    Diabetes Maternal Uncle    Diabetes Maternal Uncle    Diabetes Maternal Uncle    Diabetes Maternal Grandmother     Past medical history, surgical history, medications, allergies, family history and social history reviewed with patient today and changes made to appropriate areas of the chart.   Review of Systems  HENT:         Denies vision changes.  Eyes:  Negative for blurred vision and double vision.  Respiratory:  Negative for shortness of breath.   Cardiovascular:  Negative for chest pain, palpitations and leg swelling.  Neurological:  Negative for dizziness, tingling and headaches.  Endo/Heme/Allergies:  Negative for polydipsia.       Denies Polyuria   All other ROS negative except what is listed above and in the HPI.      Objective:    BP 114/69 (BP Location: Right Arm, Patient Position: Sitting, Cuff Size: Normal)   Pulse 79   Temp 97.7 F (36.5 C) (Oral)   Resp 17   Ht 5' 9.29" (1.76 m)   Wt 226 lb 6.4 oz (102.7 kg)   SpO2 95%   BMI 33.15 kg/m   Wt Readings from Last 3 Encounters:  09/18/23 226 lb 6.4 oz (102.7 kg)  03/19/23 209 lb 12.8 oz (95.2 kg)  08/24/22 214 lb 12.8 oz (97.4 kg)    Physical Exam Vitals and nursing note reviewed.  Constitutional:      General: He is not in acute distress.    Appearance: Normal appearance. He is normal weight. He is not ill-appearing, toxic-appearing or diaphoretic.  HENT:     Head: Normocephalic.     Right Ear: Tympanic membrane, ear canal and external ear normal.     Left Ear: Tympanic membrane, ear canal and external ear normal.     Nose: Nose normal. No congestion or rhinorrhea.     Mouth/Throat:     Mouth: Mucous membranes are moist.  Eyes:     General:  Right eye: No discharge.        Left eye: No discharge.     Extraocular  Movements: Extraocular movements intact.     Conjunctiva/sclera: Conjunctivae normal.     Pupils: Pupils are equal, round, and reactive to light.  Cardiovascular:     Rate and Rhythm: Normal rate and regular rhythm.     Heart sounds: No murmur heard. Pulmonary:     Effort: Pulmonary effort is normal. No respiratory distress.     Breath sounds: Normal breath sounds. No wheezing, rhonchi or rales.  Abdominal:     General: Abdomen is flat. Bowel sounds are normal. There is no distension.     Palpations: Abdomen is soft.     Tenderness: There is no abdominal tenderness. There is no guarding.  Musculoskeletal:     Cervical back: Normal range of motion and neck supple.  Skin:    General: Skin is warm and dry.     Capillary Refill: Capillary refill takes less than 2 seconds.  Neurological:     General: No focal deficit present.     Mental Status: He is alert and oriented to person, place, and time.     Cranial Nerves: No cranial nerve deficit.     Motor: No weakness.     Deep Tendon Reflexes: Reflexes normal.  Psychiatric:        Mood and Affect: Mood normal.        Behavior: Behavior normal.        Thought Content: Thought content normal.        Judgment: Judgment normal.     Results for orders placed or performed in visit on 03/19/23  Comp Met (CMET)   Collection Time: 03/19/23 11:43 AM  Result Value Ref Range   Glucose 113 (H) 70 - 99 mg/dL   BUN 14 8 - 27 mg/dL   Creatinine, Ser 9.62 (L) 0.76 - 1.27 mg/dL   eGFR 96 >95 MW/UXL/2.44   BUN/Creatinine Ratio 20 10 - 24   Sodium 138 134 - 144 mmol/L   Potassium 4.3 3.5 - 5.2 mmol/L   Chloride 99 96 - 106 mmol/L   CO2 24 20 - 29 mmol/L   Calcium 9.3 8.6 - 10.2 mg/dL   Total Protein 6.6 6.0 - 8.5 g/dL   Albumin 4.5 3.8 - 4.8 g/dL   Globulin, Total 2.1 1.5 - 4.5 g/dL   Bilirubin Total 0.3 0.0 - 1.2 mg/dL   Alkaline Phosphatase 95 44 - 121 IU/L   AST 21 0 - 40 IU/L   ALT 19 0 - 44 IU/L  Lipid Profile   Collection Time:  03/19/23 11:43 AM  Result Value Ref Range   Cholesterol, Total 128 100 - 199 mg/dL   Triglycerides 010 0 - 149 mg/dL   HDL 48 >27 mg/dL   VLDL Cholesterol Cal 20 5 - 40 mg/dL   LDL Chol Calc (NIH) 60 0 - 99 mg/dL   Chol/HDL Ratio 2.7 0.0 - 5.0 ratio  HgB A1c   Collection Time: 03/19/23 11:43 AM  Result Value Ref Range   Hgb A1c MFr Bld 6.3 (H) 4.8 - 5.6 %   Est. average glucose Bld gHb Est-mCnc 134 mg/dL      Assessment & Plan:   Problem List Items Addressed This Visit       Cardiovascular and Mediastinum   Paroxysmal atrial flutter (HCC)   Chronic.  Controlled.  Continue with current medication regimen of Cardizem.  Refills sent today.  Labs  ordered today.  Return to clinic in 6 months for reevaluation.  Call sooner if concerns arise.       Relevant Medications   atorvastatin (LIPITOR) 80 MG tablet   diltiazem (CARDIZEM CD) 240 MG 24 hr capsule   Hypertension associated with diabetes (HCC)   Chronic.  Controlled.  Continue with current medication regimen of Cardizem.  Refills sent today.  Labs ordered today.  Return to clinic in 6 months for reevaluation.  Call sooner if concerns arise.       Relevant Medications   atorvastatin (LIPITOR) 80 MG tablet   diltiazem (CARDIZEM CD) 240 MG 24 hr capsule   metFORMIN (GLUCOPHAGE) 500 MG tablet   Other Relevant Orders   Comprehensive metabolic panel with GFR     Endocrine   Hyperlipidemia associated with type 2 diabetes mellitus (HCC) - Primary   Chronic.  Controlled.  Continue with current medication regimen of Atorvastatin 80mg  daily.  Refills sent today.  Labs ordered today.  Return to clinic in 6 months for reevaluation.  Call sooner if concerns arise.       Relevant Medications   atorvastatin (LIPITOR) 80 MG tablet   diltiazem (CARDIZEM CD) 240 MG 24 hr capsule   metFORMIN (GLUCOPHAGE) 500 MG tablet   Other Relevant Orders   Lipid panel   Controlled diabetes mellitus type 2 with complications (HCC)   Chronic.   Controlled.  Last A1c was 6.3% in April.  Continue with current medication regimen of Metformin 500mg  daily.  Refill sent today.  Labs ordered today.  Microalbumin checked today.  Eye exam is up to date- need to request from [place]. Return to clinic in 6 months for reevaluation.  Call sooner if concerns arise.        Relevant Medications   atorvastatin (LIPITOR) 80 MG tablet   metFORMIN (GLUCOPHAGE) 500 MG tablet   Other Relevant Orders   Hemoglobin A1c   Microalbumin, Urine Waived     Musculoskeletal and Integument   Secondary neuroendocrine tumor of bone (HCC)     Other   Macular degeneration, wet (HCC)   Chronic.  Doing well.  Had a check up yesterday and was told to return in 6 months.        Other Visit Diagnoses       Mixed hyperlipidemia       Relevant Medications   atorvastatin (LIPITOR) 80 MG tablet   diltiazem (CARDIZEM CD) 240 MG 24 hr capsule        Discussed aspirin prophylaxis for myocardial infarction prevention and decision was it was not indicated  LABORATORY TESTING:  Health maintenance labs ordered today as discussed above.   The natural history of prostate cancer and ongoing controversy regarding screening and potential treatment outcomes of prostate cancer has been discussed with the patient. The meaning of a false positive PSA and a false negative PSA has been discussed. He indicates understanding of the limitations of this screening test and wishes to proceed with screening PSA testing.   IMMUNIZATIONS:   - Tdap: Tetanus vaccination status reviewed: last tetanus booster within 10 years. - Influenza: Up to date - Pneumovax: Up to date - Prevnar: Up to date - COVID: Up to date - HPV: Not applicable - Shingrix vaccine: Up to date  SCREENING: - Colonoscopy: Up to date  Discussed with patient purpose of the colonoscopy is to detect colon cancer at curable precancerous or early stages   - AAA Screening: Not applicable  -Hearing Test: Not  applicable  -  Spirometry: Not applicable   PATIENT COUNSELING:    Sexuality: Discussed sexually transmitted diseases, partner selection, use of condoms, avoidance of unintended pregnancy  and contraceptive alternatives.   Advised to avoid cigarette smoking.  I discussed with the patient that most people either abstain from alcohol or drink within safe limits (<=14/week and <=4 drinks/occasion for males, <=7/weeks and <= 3 drinks/occasion for females) and that the risk for alcohol disorders and other health effects rises proportionally with the number of drinks per week and how often a drinker exceeds daily limits.  Discussed cessation/primary prevention of drug use and availability of treatment for abuse.   Diet: Encouraged to adjust caloric intake to maintain  or achieve ideal body weight, to reduce intake of dietary saturated fat and total fat, to limit sodium intake by avoiding high sodium foods and not adding table salt, and to maintain adequate dietary potassium and calcium preferably from fresh fruits, vegetables, and low-fat dairy products.    stressed the importance of regular exercise  Injury prevention: Discussed safety belts, safety helmets, smoke detector, smoking near bedding or upholstery.   Dental health: Discussed importance of regular tooth brushing, flossing, and dental visits.   Follow up plan: NEXT PREVENTATIVE PHYSICAL DUE IN 1 YEAR. Return in about 6 months (around 03/19/2024) for HTN, HLD, DM2 FU.

## 2023-09-18 NOTE — Assessment & Plan Note (Signed)
 Chronic.  Controlled.  Last A1c was 6.3% in April.  Continue with current medication regimen of Metformin 500mg  daily.  Refill sent today.  Labs ordered today.  Microalbumin checked today.  Eye exam is up to date- need to request from [place]. Return to clinic in 6 months for reevaluation.  Call sooner if concerns arise.

## 2023-09-18 NOTE — Assessment & Plan Note (Signed)
 Chronic.  Doing well.  Had a check up yesterday and was told to return in 6 months.

## 2023-09-19 ENCOUNTER — Encounter: Payer: Self-pay | Admitting: Nurse Practitioner

## 2023-09-19 LAB — COMPREHENSIVE METABOLIC PANEL WITH GFR
ALT: 21 IU/L (ref 0–44)
AST: 28 IU/L (ref 0–40)
Albumin: 4.7 g/dL (ref 3.8–4.8)
Alkaline Phosphatase: 86 IU/L (ref 44–121)
BUN/Creatinine Ratio: 18 (ref 10–24)
BUN: 14 mg/dL (ref 8–27)
Bilirubin Total: 0.3 mg/dL (ref 0.0–1.2)
CO2: 25 mmol/L (ref 20–29)
Calcium: 8.8 mg/dL (ref 8.6–10.2)
Chloride: 101 mmol/L (ref 96–106)
Creatinine, Ser: 0.8 mg/dL (ref 0.76–1.27)
Globulin, Total: 2.1 g/dL (ref 1.5–4.5)
Glucose: 123 mg/dL — ABNORMAL HIGH (ref 70–99)
Potassium: 4.4 mmol/L (ref 3.5–5.2)
Sodium: 139 mmol/L (ref 134–144)
Total Protein: 6.8 g/dL (ref 6.0–8.5)
eGFR: 93 mL/min/{1.73_m2} (ref >59)

## 2023-09-19 LAB — LIPID PANEL
Chol/HDL Ratio: 2.8 ratio (ref 0.0–5.0)
Cholesterol, Total: 134 mg/dL (ref 100–199)
HDL: 48 mg/dL (ref >39)
LDL Chol Calc (NIH): 66 mg/dL (ref 0–99)
Triglycerides: 107 mg/dL (ref 0–149)
VLDL Cholesterol Cal: 20 mg/dL (ref 5–40)

## 2023-09-19 LAB — HEMOGLOBIN A1C
Est. average glucose Bld gHb Est-mCnc: 148 mg/dL
Hgb A1c MFr Bld: 6.8 % — ABNORMAL HIGH (ref 4.8–5.6)

## 2023-09-19 MED ORDER — METFORMIN HCL 500 MG PO TABS: 500.0000 mg | ORAL_TABLET | Freq: Two times a day (BID) | ORAL | 1 refills | Status: DC

## 2023-09-19 NOTE — Addendum Note (Signed)
 Addended by: Larae Grooms on: 09/19/2023 08:22 AM   Modules accepted: Orders

## 2023-09-23 DIAGNOSIS — D3A8 Other benign neuroendocrine tumors: Secondary | ICD-10-CM | POA: Diagnosis not present

## 2023-09-23 DIAGNOSIS — C7A8 Other malignant neuroendocrine tumors: Secondary | ICD-10-CM | POA: Diagnosis not present

## 2023-09-23 DIAGNOSIS — C7B8 Other secondary neuroendocrine tumors: Secondary | ICD-10-CM | POA: Diagnosis not present

## 2023-10-21 DIAGNOSIS — C7B8 Other secondary neuroendocrine tumors: Secondary | ICD-10-CM | POA: Diagnosis not present

## 2023-10-21 DIAGNOSIS — D3A8 Other benign neuroendocrine tumors: Secondary | ICD-10-CM | POA: Diagnosis not present

## 2023-11-18 DIAGNOSIS — C7B8 Other secondary neuroendocrine tumors: Secondary | ICD-10-CM | POA: Diagnosis not present

## 2023-11-18 DIAGNOSIS — D3A8 Other benign neuroendocrine tumors: Secondary | ICD-10-CM | POA: Diagnosis not present

## 2023-11-23 ENCOUNTER — Other Ambulatory Visit: Payer: Self-pay | Admitting: Nurse Practitioner

## 2023-11-23 DIAGNOSIS — I152 Hypertension secondary to endocrine disorders: Secondary | ICD-10-CM

## 2023-11-25 DIAGNOSIS — E782 Mixed hyperlipidemia: Secondary | ICD-10-CM | POA: Diagnosis not present

## 2023-11-25 DIAGNOSIS — I152 Hypertension secondary to endocrine disorders: Secondary | ICD-10-CM | POA: Diagnosis not present

## 2023-11-25 DIAGNOSIS — E1159 Type 2 diabetes mellitus with other circulatory complications: Secondary | ICD-10-CM | POA: Diagnosis not present

## 2023-11-25 DIAGNOSIS — I4892 Unspecified atrial flutter: Secondary | ICD-10-CM | POA: Diagnosis not present

## 2023-11-25 DIAGNOSIS — I10 Essential (primary) hypertension: Secondary | ICD-10-CM | POA: Diagnosis not present

## 2023-11-25 DIAGNOSIS — I77810 Thoracic aortic ectasia: Secondary | ICD-10-CM | POA: Diagnosis not present

## 2023-11-25 DIAGNOSIS — I700 Atherosclerosis of aorta: Secondary | ICD-10-CM | POA: Diagnosis not present

## 2023-11-26 DIAGNOSIS — I4892 Unspecified atrial flutter: Secondary | ICD-10-CM | POA: Diagnosis not present

## 2023-11-26 DIAGNOSIS — M899 Disorder of bone, unspecified: Secondary | ICD-10-CM | POA: Diagnosis not present

## 2023-11-26 DIAGNOSIS — C7A8 Other malignant neuroendocrine tumors: Secondary | ICD-10-CM | POA: Diagnosis not present

## 2023-11-26 DIAGNOSIS — R911 Solitary pulmonary nodule: Secondary | ICD-10-CM | POA: Diagnosis not present

## 2023-11-26 DIAGNOSIS — C7B8 Other secondary neuroendocrine tumors: Secondary | ICD-10-CM | POA: Diagnosis not present

## 2023-11-26 NOTE — Telephone Encounter (Signed)
 Requested by interface [email or web address]. Future visit 03/19/24. Requested Prescriptions  Pending Prescriptions Disp Refills   metFORMIN  (GLUCOPHAGE ) 500 MG tablet [Pharmacy Med Name: metFORMIN  HCl 500 MG Oral Tablet] 180 tablet 1    Sig: TAKE 1 TABLET BY MOUTH TWICE  DAILY WITH MEALS     Endocrinology:  Diabetes - Biguanides Failed - 11/26/2023 12:45 PM      Failed - B12 Level in normal range and within 720 days    No results found for: VITAMINB12       Failed - CBC within normal limits and completed in the last 12 months    WBC  Date Value Ref Range Status  08/24/2022 5.1 3.4 - 10.8 x10E3/uL Final  12/04/2021 5.3 4.0 - 10.5 K/uL Final   RBC  Date Value Ref Range Status  08/24/2022 4.84 4.14 - 5.80 x10E6/uL Final  12/04/2021 3.59 (L) 4.22 - 5.81 MIL/uL Final   Hemoglobin  Date Value Ref Range Status  08/24/2022 15.1 13.0 - 17.7 g/dL Final   Hematocrit  Date Value Ref Range Status  08/24/2022 45.3 37.5 - 51.0 % Final   MCHC  Date Value Ref Range Status  08/24/2022 33.3 31.5 - 35.7 g/dL Final  04/54/0981 19.1 30.0 - 36.0 g/dL Final   [facility]  Date Value Ref Range Status  08/24/2022 31.2 26.6 - 33.0 pg Final  12/04/2021 30.6 26.0 - 34.0 pg Final   MCV  Date Value Ref Range Status  08/24/2022 94 79 - 97 fL Final   No results found for: PLTCOUNTKUC, LABPLAT, POCPLA RDW  Date Value Ref Range Status  08/24/2022 11.6 11.6 - 15.4 % Final         Passed - Cr in normal range and within 360 days    Creatinine, Ser  Date Value Ref Range Status  09/18/2023 0.80 0.76 - 1.27 mg/dL Final         Passed - HBA1C is between 0 and 7.9 and within 180 days    Hemoglobin A1C  Date Value Ref Range Status  01/19/2016 6.3  Final   HB A1C (BAYER DCA - WAIVED)  Date Value Ref Range Status  01/14/2019 5.6 <7.0 % Final    Comment:                                          Diabetic Adult            <7.0                                       Healthy Adult        4.3 - 5.7                                                            (DCCT/NGSP) American Diabetes Association's Summary of Glycemic Recommendations for Adults with Diabetes: Hemoglobin A1c <7.0%. More stringent glycemic goals (A1c <6.0%) may further reduce complications at the cost of increased risk of hypoglycemia.    Hgb A1c MFr Bld  Date Value Ref Range Status  09/18/2023 6.8 (H) 4.8 - 5.6 % Final  Comment:             Prediabetes: 5.7 - 6.4          Diabetes: >6.4          Glycemic control for adults with diabetes: <7.0          Passed - eGFR in normal range and within 360 days    GFR calc Af Amer  Date Value Ref Range Status  02/04/2020 110 >59 mL/min/1.73 Final    Comment:    **[facility] currently reports eGFR in compliance with the current**   recommendations of the [employer]. [facility] will   update reporting as new guidelines are published from the NKF-ASN   Task force.    GFR, Estimated  Date Value Ref Range Status  12/04/2021 >60 >60 mL/min Final    Comment:    (NOTE) Calculated using the CKD-EPI Creatinine Equation (2021)    eGFR  Date Value Ref Range Status  09/18/2023 93 >59 mL/min/1.73 Final         Passed - Valid encounter within last 6 months    Recent Outpatient Visits           2 months ago Hyperlipidemia associated with type 2 diabetes mellitus [facility])   [facility] Aileen Alexanders, NP

## 2023-12-06 DIAGNOSIS — I4892 Unspecified atrial flutter: Secondary | ICD-10-CM | POA: Diagnosis not present

## 2023-12-08 IMAGING — CR DG CHEST 2V
1 series · 2 of 2 positions shown · non-contrast
Comparison: None.

CLINICAL DATA: Chest pain, possible food bolus stuck in the
esophagus, initial encounter

EXAM:
CHEST - 2 VIEW

[Series 1: dg chest 2 view · 0.14mm/px · 2 of 2 slices shown]
[im 1/2]
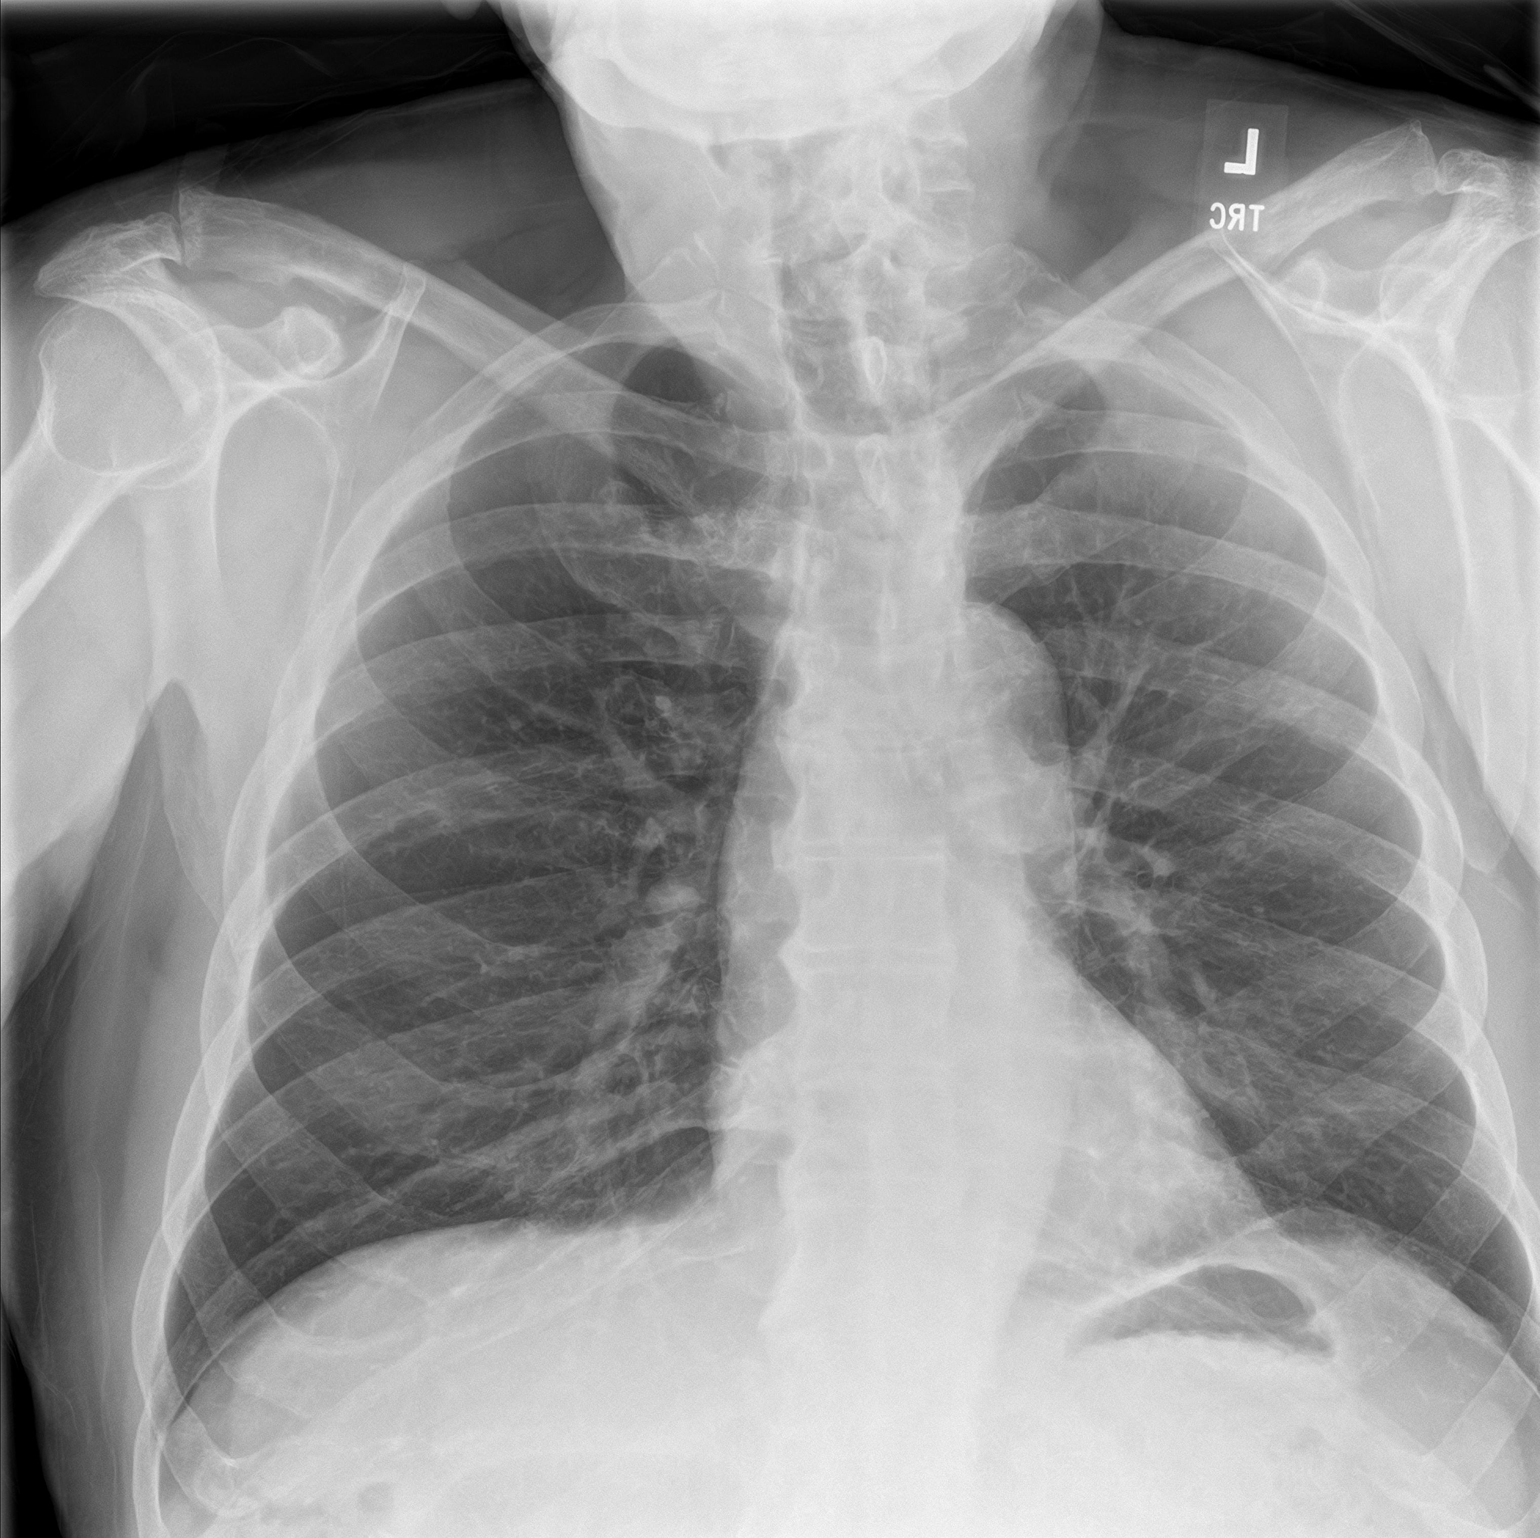
[im 2/2]
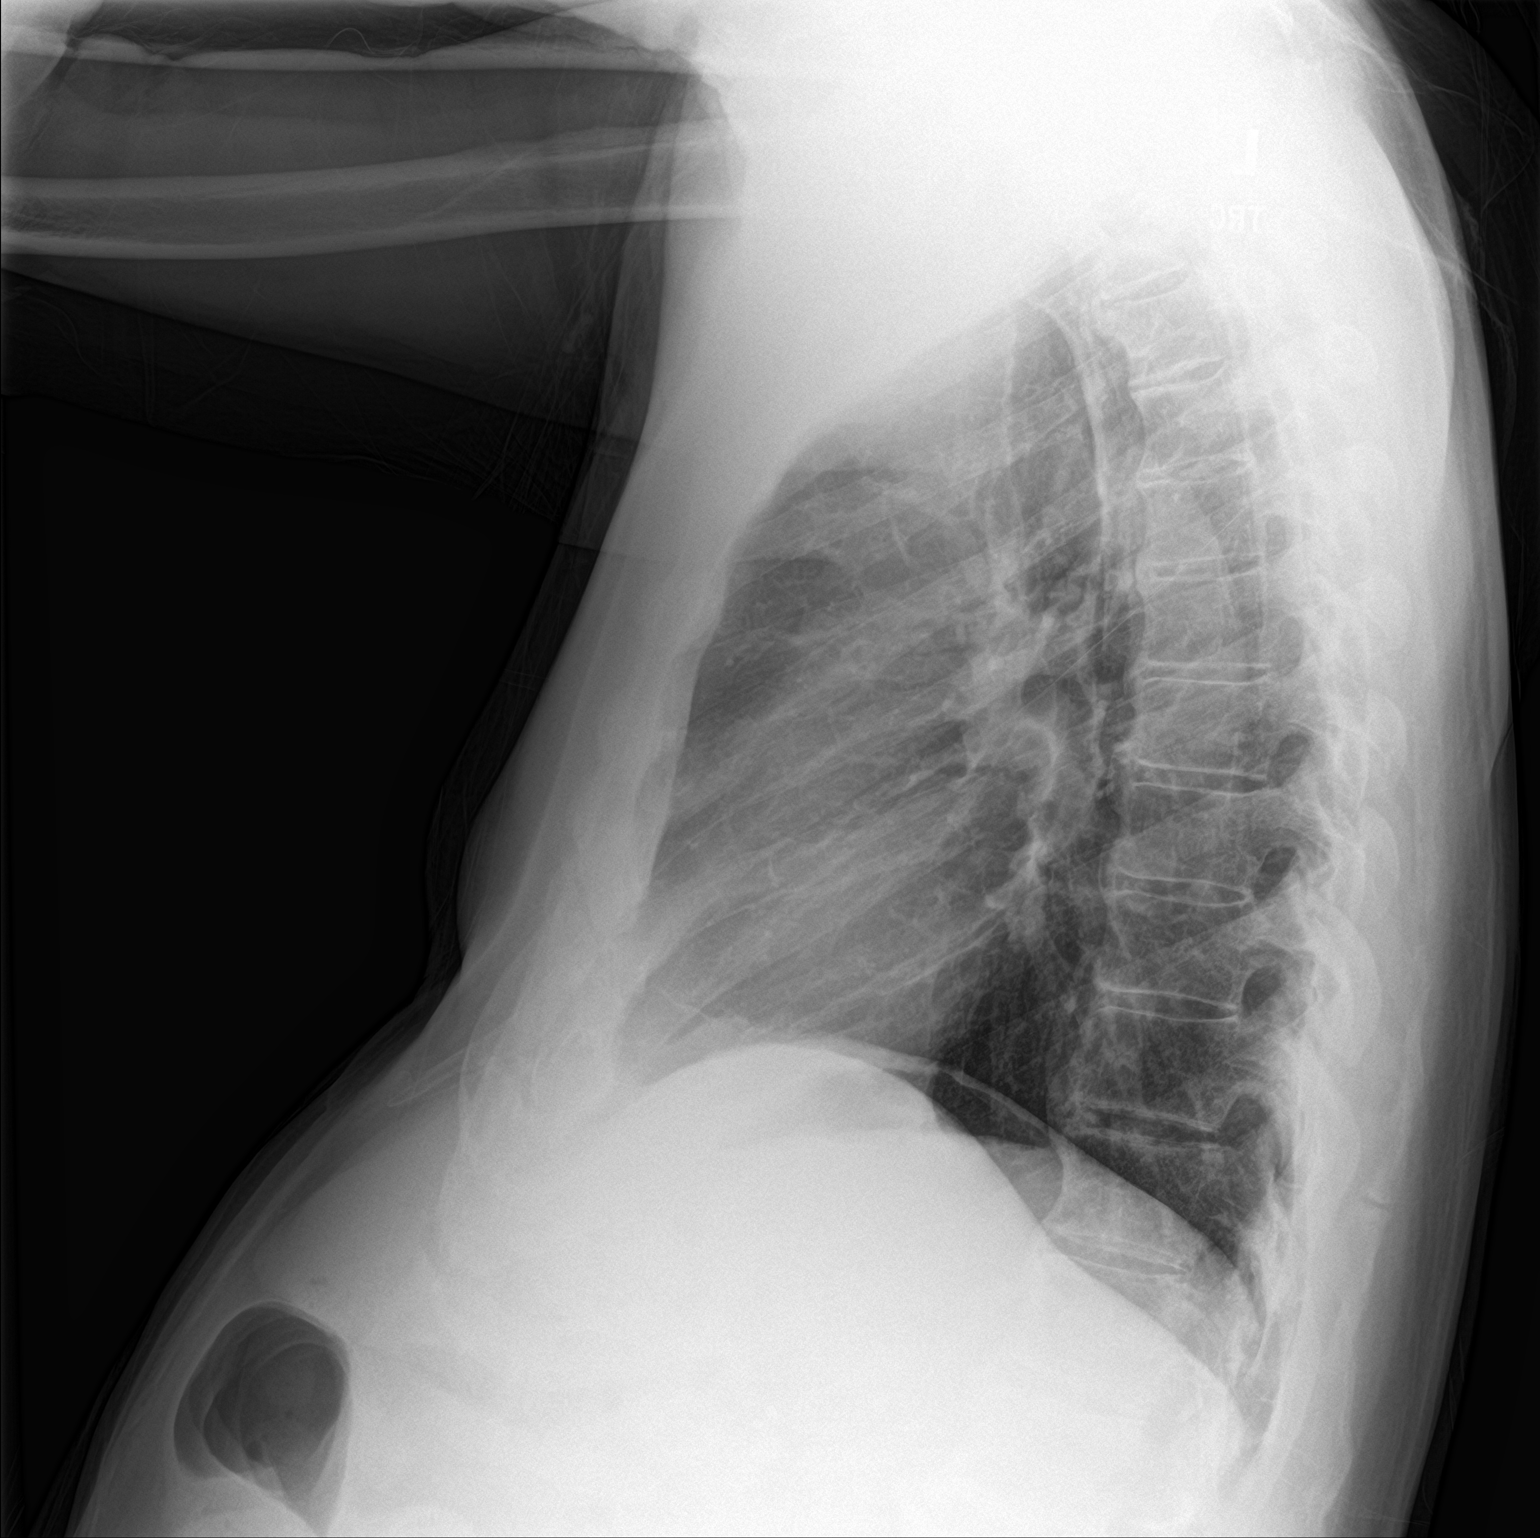

[2 of 2 positions shown; findings below may reference images not displayed]

FINDINGS: The heart size and mediastinal contours are within normal limits.
Both lungs are clear. The visualized skeletal structures are
unremarkable.
IMPRESSION: No active cardiopulmonary disease.

## 2023-12-09 IMAGING — CT CT CHEST-ABD-PELV W/ CM
2 of 5 series · 13 of 36 positions shown, 15 images · IV contrast (iohexol)
Comparison: 12/30/2016

CLINICAL DATA: Evaluate for foreign body. Upper abdominal pain
after eating steak. History of neuroendocrine tumor

EXAM:
CT CHEST, ABDOMEN, AND PELVIS WITH CONTRAST
TECHNIQUE: Multidetector CT imaging of the chest, abdomen and pelvis was
performed following the standard protocol during bolus
administration of intravenous contrast.
CONTRAST:  100mL OmnipaqueAQUE IOHEXOL 300 MG/ML  SOLN

[Series 2: cap with · axial · 0.82mm/px · z∈[-1188,-608]mm · 10 of 142 slices shown, 12 images]
[im 13/142  mediastinal]
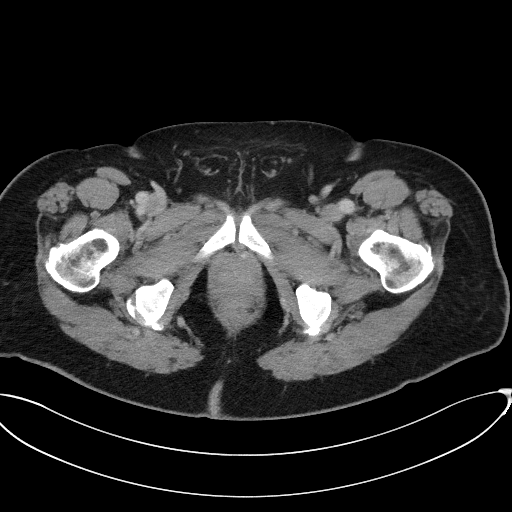
[im 13/142  bone]
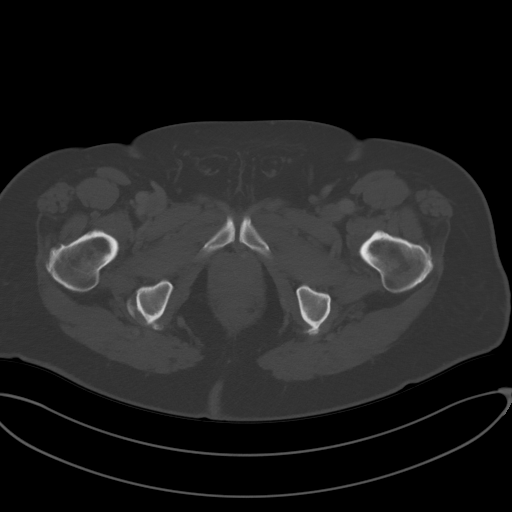
[im 26/142  mediastinal]
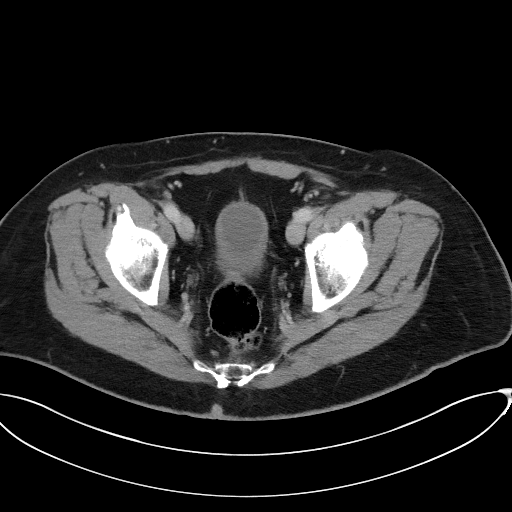
[im 39/142  mediastinal]
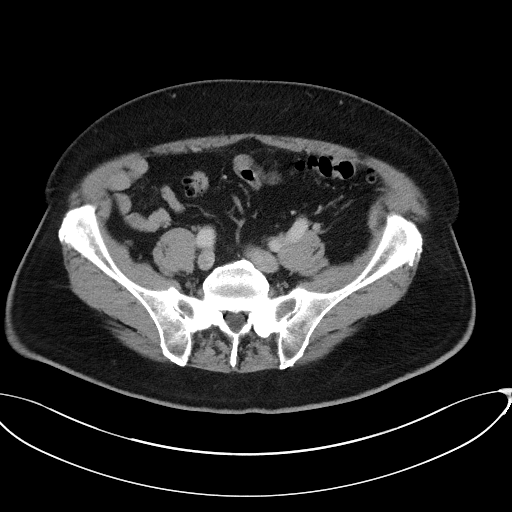
[im 52/142  mediastinal]
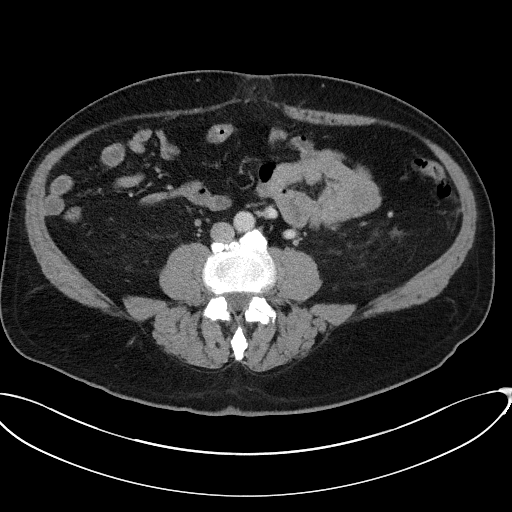
[im 65/142  mediastinal]
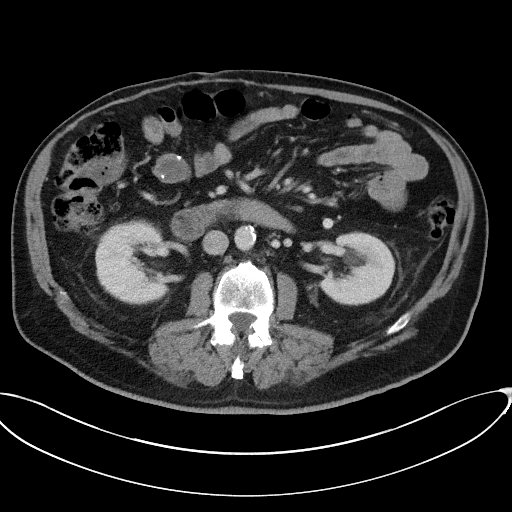
[im 77/142  mediastinal]
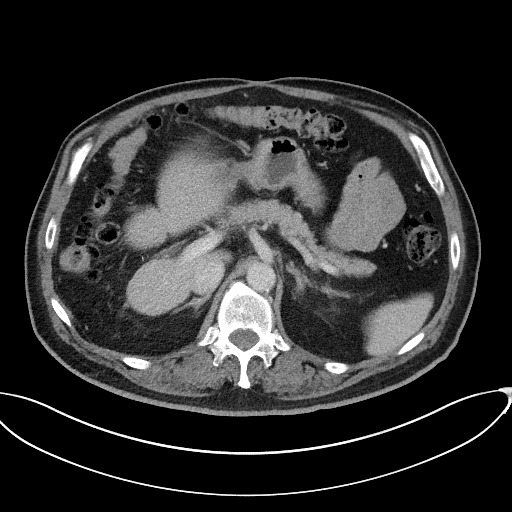
[im 90/142  mediastinal]
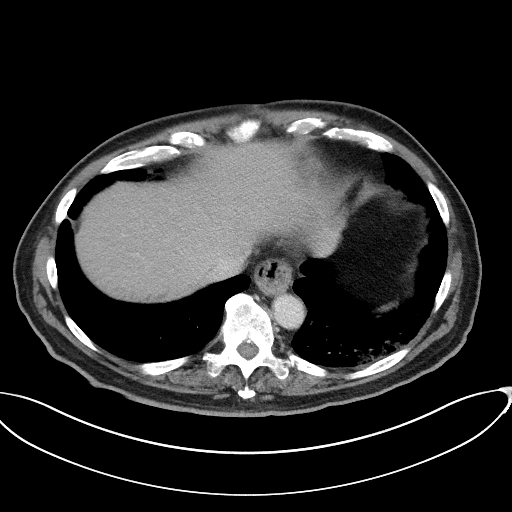
[im 103/142  mediastinal]
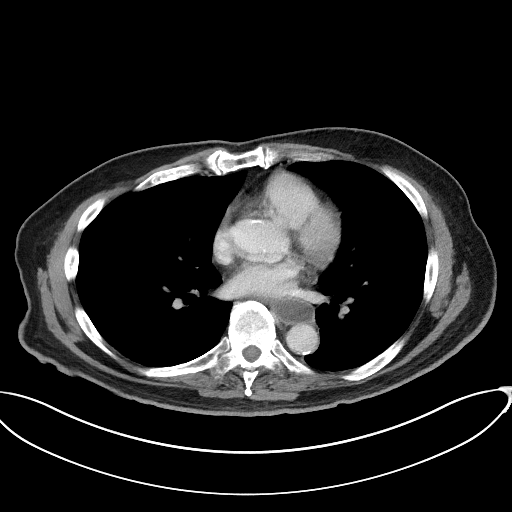
[im 116/142  mediastinal]
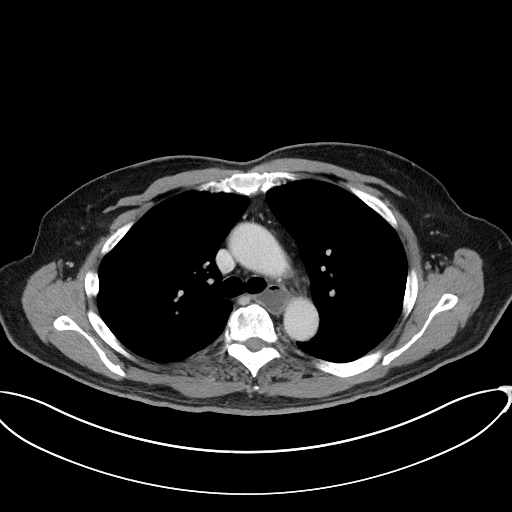
[im 116/142  bone]
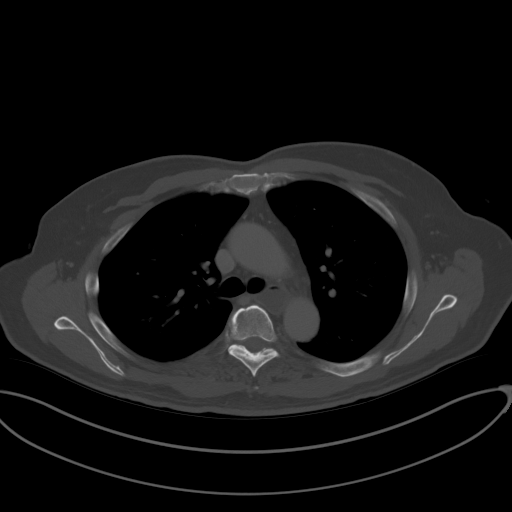
[im 129/142  mediastinal]
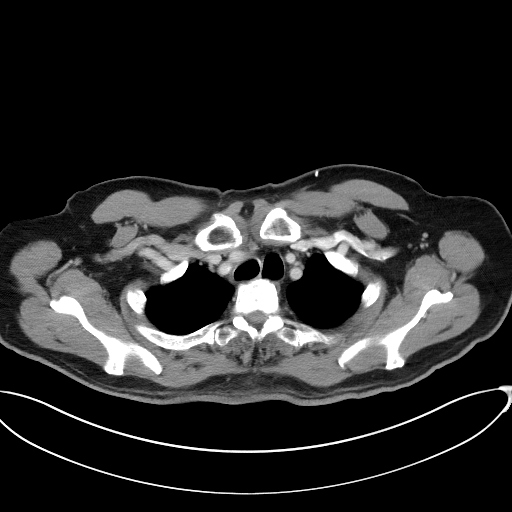

[Series 5: coronals · coronal · 0.90mm/px · 3 of 147 slices shown]
[im 30/147  mediastinal]
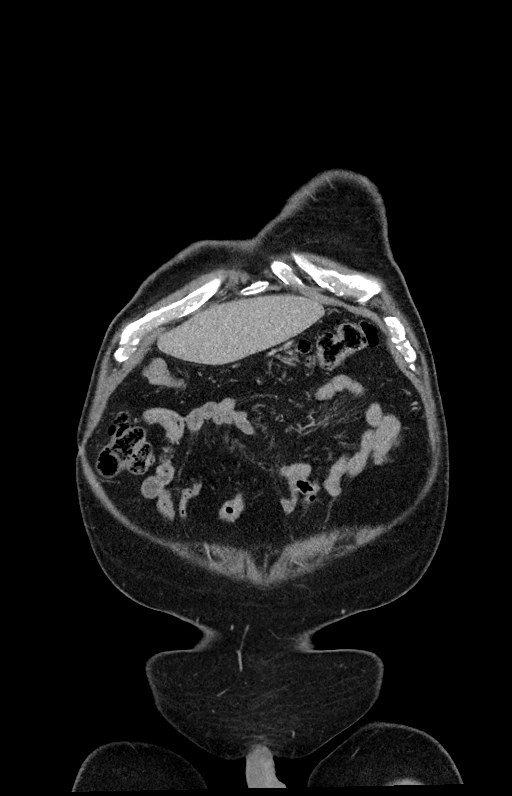
[im 59/147  mediastinal]
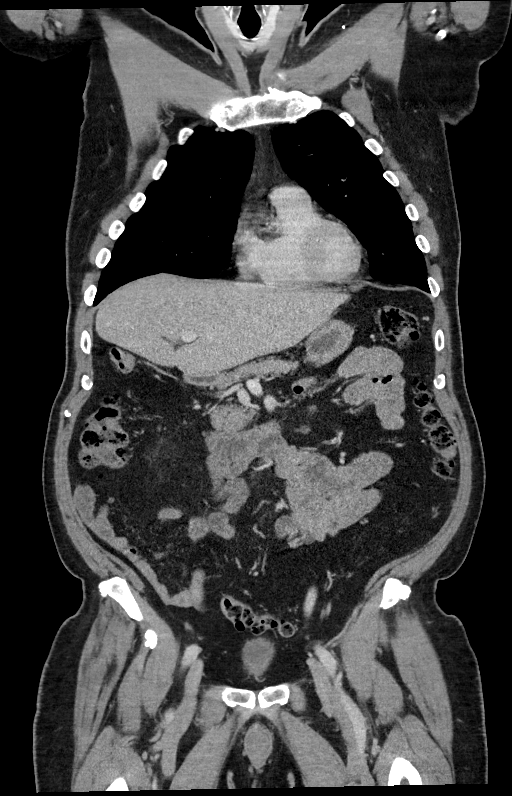
[im 88/147  mediastinal]
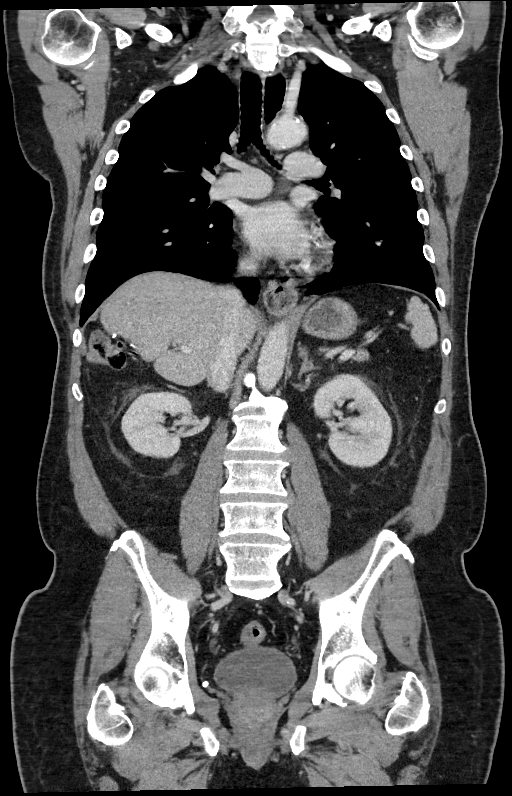

[13 of 36 positions shown; findings below may reference images not displayed]

FINDINGS: CT CHEST FINDINGS

Cardiovascular: Heart size is normal. No pericardial effusion.
Aortic and coronary artery atherosclerotic calcifications.

Mediastinum/Nodes: No enlarged mediastinal, hilar, or axillary lymph
nodes. Thyroid gland appears normal. Trachea is patent and midline.
Patulous, dilated esophagus with air-fluid level noted. Filling
defect within the distal esophagus just above the GE junction is fat
and soft tissue attenuation [REDACTED] reflect impacted food bolus
versus mass.

Lungs/Pleura: Ground-glass and airspace densities with thickening of
the peribronchovascular interstitium is noted in the left lung base
concerning for either aspiration or pneumonia. No pleural effusion
identified.

Musculoskeletal: No chest wall mass or suspicious bone lesions
identified.

CT ABDOMEN PELVIS FINDINGS

Hepatobiliary: Postop changes from right hepatectomy identified. No
suspicious mass identified. Cholecystectomy. No bile duct
dilatation.

Pancreas: Unremarkable. No pancreatic ductal dilatation or
surrounding inflammatory changes.

Spleen: Normal in size without focal abnormality.

Adrenals/Urinary Tract: Normal appearance of the right adrenal
gland. Left adrenal nodule measures 1.2 cm, image 64/2. This was
present on the comparison exam and is compatible with a benign
adenoma. Similar appearance of bilateral perinephric fat stranding
which may be related to prior insult. No mass or hydronephrosis
identified. Small diverticula arises off the posterior wall of the
bladder measuring 1.2 cm. No focal bladder lesions identified.

Stomach/Bowel: Stomach appears nondistended. The appendix is
visualized and is within normal limits. Signs of previous small
bowel resection. No bowel wall thickening, inflammation, or
distension.

Vascular/Lymphatic: Aortic atherosclerosis. No aneurysm. No
abdominopelvic adenopathy identified.

Reproductive: Prostate is unremarkable.

Other: 6 mm peritoneal nodule anterior to the descending colon is
unchanged from prior exam, image 81/2. Umbilical hernia is
identified which contains fat only, image 95/2.

Musculoskeletal: No acute or significant osseous findings.
IMPRESSION: 1. Patulous, dilated esophagus with air-fluid level. Filling defect
within the distal esophagus just above the GE junction is fat and
soft tissue attenuation and may reflect impacted food bolus versus
mass. Correlation with direct visualization is advised.
2. Ground-glass and airspace densities with thickening of the
peribronchovascular interstitium in the left lung base concerning
for either aspiration or pneumonia.
3. No acute findings identified within the abdomen or pelvis.
4. Stable left adrenal nodule, compatible with a benign adenoma.
5. Umbilical hernia which contains fat only.
6. Aortic Atherosclerosis ([id number]).

## 2023-12-16 DIAGNOSIS — C7B8 Other secondary neuroendocrine tumors: Secondary | ICD-10-CM | POA: Diagnosis not present

## 2023-12-16 DIAGNOSIS — D3A8 Other benign neuroendocrine tumors: Secondary | ICD-10-CM | POA: Diagnosis not present

## 2023-12-18 DIAGNOSIS — I4892 Unspecified atrial flutter: Secondary | ICD-10-CM | POA: Diagnosis not present

## 2023-12-20 ENCOUNTER — Other Ambulatory Visit: Payer: Self-pay | Admitting: Nurse Practitioner

## 2023-12-20 DIAGNOSIS — I152 Hypertension secondary to endocrine disorders: Secondary | ICD-10-CM

## 2023-12-23 NOTE — Telephone Encounter (Signed)
 Requested Prescriptions  Pending Prescriptions Disp Refills   diltiazem  (CARDIZEM  CD) 240 MG 24 hr capsule [Pharmacy Med Name: DilTIAZem  CD CAP 240MG /24HR] 100 capsule 2    Sig: TAKE 1 CAPSULE BY MOUTH DAILY     Cardiovascular: Calcium  Channel Blockers 3 Passed - 12/23/2023  2:32 PM      Passed - ALT in normal range and within 360 days    ALT  Date Value Ref Range Status  09/18/2023 21 0 - 44 IU/L Final   ALT (SGPT) Piccolo, Waived  Date Value Ref Range Status  01/14/2019 26 10 - 47 U/L Final         Passed - AST in normal range and within 360 days    AST  Date Value Ref Range Status  09/18/2023 28 0 - 40 IU/L Final   AST (SGOT) Piccolo, Waived  Date Value Ref Range Status  01/14/2019 32 11 - 38 U/L Final         Passed - Cr in normal range and within 360 days    Creatinine, Ser  Date Value Ref Range Status  09/18/2023 0.80 0.76 - 1.27 mg/dL Final         Passed - Last BP in normal range    BP Readings from Last 1 Encounters:  09/18/23 114/69         Passed - Last Heart Rate in normal range    Pulse Readings from Last 1 Encounters:  09/18/23 79         Passed - Valid encounter within last 6 months    Recent Outpatient Visits           3 months ago Hyperlipidemia associated with type 2 diabetes mellitus (HCC)   [facility] Melvin Pao, NP

## 2024-01-11 ENCOUNTER — Encounter: Payer: Self-pay | Admitting: Emergency Medicine

## 2024-01-11 ENCOUNTER — Ambulatory Visit
Admission: EM | Admit: 2024-01-11 | Discharge: 2024-01-11 | Disposition: A | Discharge status: Home / Self Care | Attending: Emergency Medicine | Admitting: Emergency Medicine

## 2024-01-11 DIAGNOSIS — H6502 Acute serous otitis media, left ear: Secondary | ICD-10-CM | POA: Diagnosis not present

## 2024-01-11 DIAGNOSIS — B349 Viral infection, unspecified: Secondary | ICD-10-CM | POA: Diagnosis not present

## 2024-01-11 DIAGNOSIS — J029 Acute pharyngitis, unspecified: Secondary | ICD-10-CM | POA: Diagnosis not present

## 2024-01-11 LAB — POCT RAPID STREP A (OFFICE): Rapid Strep A Screen: NEGATIVE

## 2024-01-11 MED ORDER — AMOXICILLIN-POT CLAVULANATE 875-125 MG PO TABS: 1.0000 | ORAL_TABLET | Freq: Two times a day (BID) | ORAL | 0 refills | Status: DC

## 2024-01-11 NOTE — ED Triage Notes (Signed)
 Patent complains of sore throat radiating up to left ear x 2 days. Patient has taken sudafed PE with mild relief. Rates throat pain 3/10 and ear pain 3/10.

## 2024-01-11 NOTE — Discharge Instructions (Addendum)
 Today you are being treated for an infection of the eardrum  Rapid strep test is negative   Take Augmentin  twice daily for 7 days, you should begin to see improvement after 48 hours of medication use and then it should progressively get better  You may use Tylenol   for management of discomfort  May hold warm compresses to the ear for additional comfort  Please not attempted any ear cleaning or object or fluid placement into the ear canal to prevent further irritation

## 2024-01-11 NOTE — ED Provider Notes (Signed)
 Randy Harper    CSN: [id number] Arrival date & time: 01/11/24  9066      History   Chief Complaint Chief Complaint  Patient presents with   Sore Throat   Otalgia    HPI Randy Harper is a 73 y.o. male.   Patient presents for evaluation of a sore throat, mucus only in the throat and pain to the left ear beginning 2 days ago.  Feels as if the pain is radiating from the throat into the ear, rating a 3 out of 10.  Has taken Sudafed PE with some relief.  Denies fever, cough, nasal congestion.  No known sick contact.  Tolerable to food and liquids.  Past Medical History:  Diagnosis Date   Ankylosing spondylitis (HCC) 11/09/2021   Blood transfusion without reported diagnosis 01/2017   Cancer (HCC) 01/2017   metastic neuroendocrine - liver   Cataract 12/2022   Completed surgery   Diabetes mellitus without complication (HCC)    Dysrhythmia    supraventricular Tach   Glaucoma 2015   Hyperlipidemia    Hypertension    Muscle strain of chest wall    PE (pulmonary thromboembolism) (HCC)    SBO (small bowel obstruction) (HCC) 11/29/2021   Small bowel obstruction (HCC) 11/28/2021   Supraventricular tachycardia (HCC)     Patient Active Problem List   Diagnosis Date Noted   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   Macular degeneration, wet (HCC) 08/24/2022   SBO (small bowel obstruction) (HCC) 11/29/2021   Small bowel obstruction (HCC) 11/28/2021   Ankylosing spondylitis (HCC) 11/09/2021   Difficulty swallowing 06/06/2021   Controlled diabetes mellitus type 2 with complications (HCC) 06/06/2021   Food impaction of esophagus    Acute esophagitis    Right calf pain 03/02/2021   Neuroendocrine tumor 10/28/2019   Advanced care planning/counseling discussion 06/18/2017   Paroxysmal atrial flutter (HCC) 01/29/2017   Arthropathy of lumbar facet joint 09/28/2015   BPH (benign prostatic hyperplasia) 03/15/2015   Hyperlipidemia associated with type 2 diabetes  mellitus (HCC) 02/17/2014   Hypertension associated with diabetes (HCC) 02/17/2014    Past Surgical History:  Procedure Laterality Date   CHOLECYSTECTOMY  01/2017   related to liver cancer   COLONOSCOPY     COLONOSCOPY WITH PROPOFOL  N/A 03/17/2019   Procedure: COLONOSCOPY WITH PROPOFOL ;  Surgeon: Gaylyn Gladis PENNER, MD;  Location: [facility] ENDOSCOPY;  Service: Endoscopy;  Laterality: N/A;   ESOPHAGOGASTRODUODENOSCOPY N/A 06/06/2021   Procedure: ESOPHAGOGASTRODUODENOSCOPY (EGD);  Surgeon: Janalyn Keene NOVAK, MD;  Location: [facility] ENDOSCOPY;  Service: Endoscopy;  Laterality: N/A;   ESOPHAGOGASTRODUODENOSCOPY (EGD) WITH PROPOFOL  N/A 08/22/2021   Procedure: ESOPHAGOGASTRODUODENOSCOPY (EGD) WITH PROPOFOL ;  Surgeon: Maryruth Ole DASEN, MD;  Location: [facility] ENDOSCOPY;  Service: Endoscopy;  Laterality: N/A;  DM   EYE SURGERY  12/24   LAPAROSCOPY ABDOMEN DIAGNOSTIC     LAPAROTOMY N/A 11/30/2021   Procedure: EXPLORATORY LAPAROTOMY;  Surgeon: Jordis Laneta FALCON, MD;  Location: [facility] ORS;  Service: General;  Laterality: N/A;   LYSIS OF ADHESION N/A 11/30/2021   Procedure: LYSIS OF ADHESION;  Surgeon: Jordis Laneta FALCON, MD;  Location: [facility] ORS;  Service: General;  Laterality: N/A;   resection liver total right lobe  12/2016   SMALL INTESTINE SURGERY  01/2017   cancer surgery   TENDON GRAFT Right    right thumb tendon graft at age 55    VENTRAL HERNIA REPAIR N/A 11/30/2021   Procedure: HERNIA REPAIR VENTRAL ADULT;  Surgeon: Jordis Laneta FALCON, MD;  Location: [facility] ORS;  Service: General;  Laterality: N/A;       Home Medications    Prior to Admission medications   Medication Sig Start Date End Date Taking? Authorizing Provider  amoxicillin -clavulanate (AUGMENTIN ) 875-125 MG tablet Take 1 tablet by mouth every 12 (twelve) hours. 01/11/24  Yes Rashi Granier, Shelba SAUNDERS, NP  apixaban (ELIQUIS) 5 MG TABS tablet Take 5 mg by mouth 2 (two) times daily. 12/18/23  Yes [provider]  acetaminophen  (TYLENOL ) 500 MG  tablet Take 500 mg by mouth every 6 (six) hours as needed for mild pain, moderate pain or headache.    [provider]  atorvastatin  (LIPITOR ) 80 MG tablet Take 1 tablet (80 mg total) by mouth daily. 09/18/23   Melvin Pao, NP  brimonidine  (ALPHAGAN ) 0.2 % ophthalmic solution Place 1 drop into the left eye daily.  09/12/15   [provider]  diltiazem  (CARDIZEM  CD) 240 MG 24 hr capsule TAKE 1 CAPSULE BY MOUTH DAILY 12/23/23   Melvin Pao, NP  lanreotide acetate (SOMATULINE DEPOT) 120 MG/0.5ML injection Inject 120 mg into the skin every 28 (twenty-eight) days.    [provider]  metFORMIN  (GLUCOPHAGE ) 500 MG tablet TAKE 1 TABLET BY MOUTH TWICE  DAILY WITH MEALS 11/26/23   Melvin Pao, NP  Multiple Vitamin (MULTIVITAMIN) tablet Take 1 tablet by mouth daily.    [provider]  Multiple Vitamins-Minerals (PRESERVISION AREDS 2+MULTI VIT PO) Take 1 tablet by mouth daily.    [provider]  pantoprazole  (PROTONIX ) 40 MG tablet Take 1 tablet (40 mg total) by mouth daily for 28 days. 06/06/21 11/28/21  Tahiliani, Varnita B, MD  Zoledronic  Acid (ZOMETA ) 4 MG/100ML IVPB Inject 4 mg into the vein every 3 (three) months.    [provider]    Family History Family History  Problem Relation Age of Onset   Heart disease Father    Diabetes Maternal Uncle    Diabetes Maternal Uncle    Diabetes Maternal Uncle    Diabetes Maternal Uncle    Diabetes Maternal Grandmother     Social History Social History   Tobacco Use   Smoking status: Never   Smokeless tobacco: Never  Vaping Use   Vaping status: Never Used  Substance Use Topics   Alcohol use: No   Drug use: No     Allergies   Patient has no known allergies.   Review of Systems Review of Systems  Constitutional:  Negative for chills and fever.  HENT:  Negative for ear pain and sore throat.   Eyes:  Negative for pain and visual disturbance.  Respiratory:  Negative for cough  and shortness of breath.   Cardiovascular:  Negative for chest pain and palpitations.  Gastrointestinal:  Negative for abdominal pain and vomiting.  Genitourinary:  Negative for dysuria and hematuria.  Musculoskeletal:  Negative for arthralgias and back pain.  Skin:  Negative for color change and rash.  Neurological:  Negative for seizures and syncope.  All other systems reviewed and are negative.    Physical Exam Triage Vital Signs ED Triage Vitals  Encounter Vitals Group     BP 01/11/24 0944 119/69     Girls Systolic BP Percentile --      Girls Diastolic BP Percentile --      Boys Systolic BP Percentile --      Boys Diastolic BP Percentile --      Pulse Rate 01/11/24 0944 90     Resp 01/11/24 0944 18  Temp 01/11/24 0944 98.1 F (36.7 C)     Temp Source 01/11/24 0944 Oral     SpO2 01/11/24 0944 96 %     Weight --      Height --      Head Circumference --      Peak Flow --      Pain Score 01/11/24 0941 3     Pain Loc --      Pain Education --      Exclude from Growth Chart --    No data found.  Updated Vital Signs BP 119/69 (BP Location: Left Arm)   Pulse 90   Temp 98.1 F (36.7 C) (Oral)   Resp 18   SpO2 96%   Visual Acuity Right Eye Distance:   Left Eye Distance:   Bilateral Distance:    Right Eye Near:   Left Eye Near:    Bilateral Near:     Physical Exam Constitutional:      Appearance: Normal appearance.  HENT:     Right Ear: Hearing, tympanic membrane and ear canal normal.     Left Ear: Tympanic membrane is erythematous.     Nose: Nose normal.     Mouth/Throat:     Pharynx: No oropharyngeal exudate or posterior oropharyngeal erythema.  Eyes:     Extraocular Movements: Extraocular movements intact.  Pulmonary:     Effort: Pulmonary effort is normal.  Musculoskeletal:     Cervical back: Normal range of motion.  Lymphadenopathy:     Cervical: Cervical adenopathy present.  Neurological:     Mental Status: He is alert and oriented to person,  place, and time.      UC Treatments / Results  Labs (all labs ordered are listed, but only abnormal results are displayed) Labs Reviewed  POCT RAPID STREP A (OFFICE) - Normal    EKG   Radiology No results found.  Procedures Procedures (including critical care time)  Medications Ordered in UC Medications - No data to display  Initial Impression / Assessment and Plan / UC Course  I have reviewed the triage vital signs and the nursing notes.  Pertinent labs & imaging results that were available during my care of the patient were reviewed by me and considered in my medical decision making (see chart for details).  Nonrecurrent acute serous otitis media of left ear, sore throat  Patient is in no signs of distress nor toxic appearing.  Vital signs are stable.  Low suspicion for pneumonia, pneumothorax or bronchitis and therefore will defer imaging.  Rapid strep test negative, discussed findings.  Erythema present to the left tympanic membrane consistent with infection, discussed findings, prescribed Augmentin .May use additional over-the-counter medications as needed for supportive care.  May follow-up with urgent care as needed if symptoms persist or worsen.   Final Clinical Impressions(s) / UC Diagnoses   Final diagnoses:  Non-recurrent acute serous otitis media of left ear  Sore throat     Discharge Instructions      Today you are being treated for an infection of the eardrum  Rapid strep test is negative   Take Augmentin  twice daily for 7 days, you should begin to see improvement after 48 hours of medication use and then it should progressively get better  You may use Tylenol   for management of discomfort  May hold warm compresses to the ear for additional comfort  Please not attempted any ear cleaning or object or fluid placement into the ear canal to prevent further  irritation    ED Prescriptions     Medication Sig Dispense Auth. Provider    amoxicillin -clavulanate (AUGMENTIN ) 875-125 MG tablet Take 1 tablet by mouth every 12 (twelve) hours. 14 tablet Anabeth Chilcott R, NP      PDMP not reviewed this encounter.   Teresa Shelba SAUNDERS, NP 01/11/24 1000

## 2024-01-13 DIAGNOSIS — D3A8 Other benign neuroendocrine tumors: Secondary | ICD-10-CM | POA: Diagnosis not present

## 2024-01-13 DIAGNOSIS — C7B8 Other secondary neuroendocrine tumors: Secondary | ICD-10-CM | POA: Diagnosis not present

## 2024-01-29 DIAGNOSIS — Z7901 Long term (current) use of anticoagulants: Secondary | ICD-10-CM | POA: Diagnosis not present

## 2024-02-01 ENCOUNTER — Other Ambulatory Visit: Payer: Self-pay | Admitting: Nurse Practitioner

## 2024-02-01 DIAGNOSIS — I152 Hypertension secondary to endocrine disorders: Secondary | ICD-10-CM

## 2024-02-03 NOTE — Telephone Encounter (Signed)
 Too soon for refill, LRF 11/26/23 for 90 and 1 RF.  Requested Prescriptions  Pending Prescriptions Disp Refills   metFORMIN  (GLUCOPHAGE ) 500 MG tablet [Pharmacy Med Name: metFORMIN  HCl 500 MG Oral Tablet] 200 tablet 2    Sig: TAKE 1 TABLET BY MOUTH TWICE  DAILY WITH MEALS     Endocrinology:  Diabetes - Biguanides Failed - 02/03/2024  4:11 PM      Failed - B12 Level in normal range and within 720 days    No results found for: VITAMINB12       Failed - CBC within normal limits and completed in the last 12 months    WBC  Date Value Ref Range Status  08/24/2022 5.1 3.4 - 10.8 x10E3/uL Final  12/04/2021 5.3 4.0 - 10.5 K/uL Final   RBC  Date Value Ref Range Status  08/24/2022 4.84 4.14 - 5.80 x10E6/uL Final  12/04/2021 3.59 (L) 4.22 - 5.81 MIL/uL Final   Hemoglobin  Date Value Ref Range Status  08/24/2022 15.1 13.0 - 17.7 g/dL Final   Hematocrit  Date Value Ref Range Status  08/24/2022 45.3 37.5 - 51.0 % Final   MCHC  Date Value Ref Range Status  08/24/2022 33.3 31.5 - 35.7 g/dL Final  93/73/7976 67.5 30.0 - 36.0 g/dL Final   [facility]  Date Value Ref Range Status  08/24/2022 31.2 26.6 - 33.0 pg Final  12/04/2021 30.6 26.0 - 34.0 pg Final   MCV  Date Value Ref Range Status  08/24/2022 94 79 - 97 fL Final   No results found for: PLTCOUNTKUC, LABPLAT, POCPLA RDW  Date Value Ref Range Status  08/24/2022 11.6 11.6 - 15.4 % Final         Passed - Cr in normal range and within 360 days    Creatinine, Ser  Date Value Ref Range Status  09/18/2023 0.80 0.76 - 1.27 mg/dL Final         Passed - HBA1C is between 0 and 7.9 and within 180 days    Hemoglobin A1C  Date Value Ref Range Status  01/19/2016 6.3  Final   HB A1C (BAYER DCA - WAIVED)  Date Value Ref Range Status  01/14/2019 5.6 <7.0 % Final    Comment:                                          Diabetic Adult            <7.0                                       Healthy Adult        4.3 - 5.7                                                            (DCCT/NGSP) American Diabetes Association's Summary of Glycemic Recommendations for Adults with Diabetes: Hemoglobin A1c <7.0%. More stringent glycemic goals (A1c <6.0%) may further reduce complications at the cost of increased risk of hypoglycemia.    Hgb A1c MFr Bld  Date Value Ref Range Status  09/18/2023 6.8 (H) 4.8 -  5.6 % Final    Comment:             Prediabetes: 5.7 - 6.4          Diabetes: >6.4          Glycemic control for adults with diabetes: <7.0          Passed - eGFR in normal range and within 360 days    GFR calc Af Amer  Date Value Ref Range Status  02/04/2020 110 >59 mL/min/1.73 Final    Comment:    **[facility] currently reports eGFR in compliance with the current**   recommendations of the [employer]. [facility] will   update reporting as new guidelines are published from the NKF-ASN   Task force.    GFR, Estimated  Date Value Ref Range Status  12/04/2021 >60 >60 mL/min Final    Comment:    (NOTE) Calculated using the CKD-EPI Creatinine Equation (2021)    eGFR  Date Value Ref Range Status  09/18/2023 93 >59 mL/min/1.73 Final         Passed - Valid encounter within last 6 months    Recent Outpatient Visits           4 months ago Hyperlipidemia associated with type 2 diabetes mellitus [facility])   [place] [facility] Melvin Pao, NP

## 2024-02-11 DIAGNOSIS — C7B8 Other secondary neuroendocrine tumors: Secondary | ICD-10-CM | POA: Diagnosis not present

## 2024-02-11 DIAGNOSIS — D3A8 Other benign neuroendocrine tumors: Secondary | ICD-10-CM | POA: Diagnosis not present

## 2024-02-12 ENCOUNTER — Other Ambulatory Visit: Payer: Self-pay | Admitting: Nurse Practitioner

## 2024-02-12 DIAGNOSIS — E1159 Type 2 diabetes mellitus with other circulatory complications: Secondary | ICD-10-CM

## 2024-02-13 NOTE — Telephone Encounter (Signed)
 Refused metformin  500 mg because it's being requested too soon.  On 11/26/2023 #180, 1 refill was given.

## 2024-02-17 DIAGNOSIS — H53452 Other localized visual field defect, left eye: Secondary | ICD-10-CM | POA: Diagnosis not present

## 2024-02-17 DIAGNOSIS — D2262 Melanocytic nevi of left upper limb, including shoulder: Secondary | ICD-10-CM | POA: Diagnosis not present

## 2024-02-17 DIAGNOSIS — L570 Actinic keratosis: Secondary | ICD-10-CM | POA: Diagnosis not present

## 2024-02-17 DIAGNOSIS — D225 Melanocytic nevi of trunk: Secondary | ICD-10-CM | POA: Diagnosis not present

## 2024-02-17 DIAGNOSIS — D1801 Hemangioma of skin and subcutaneous tissue: Secondary | ICD-10-CM | POA: Diagnosis not present

## 2024-02-17 DIAGNOSIS — D2271 Melanocytic nevi of right lower limb, including hip: Secondary | ICD-10-CM | POA: Diagnosis not present

## 2024-02-17 DIAGNOSIS — L820 Inflamed seborrheic keratosis: Secondary | ICD-10-CM | POA: Diagnosis not present

## 2024-02-17 DIAGNOSIS — D2272 Melanocytic nevi of left lower limb, including hip: Secondary | ICD-10-CM | POA: Diagnosis not present

## 2024-02-17 DIAGNOSIS — D485 Neoplasm of uncertain behavior of skin: Secondary | ICD-10-CM | POA: Diagnosis not present

## 2024-02-17 DIAGNOSIS — L821 Other seborrheic keratosis: Secondary | ICD-10-CM | POA: Diagnosis not present

## 2024-02-17 DIAGNOSIS — D2261 Melanocytic nevi of right upper limb, including shoulder: Secondary | ICD-10-CM | POA: Diagnosis not present

## 2024-02-25 DIAGNOSIS — H40013 Open angle with borderline findings, low risk, bilateral: Secondary | ICD-10-CM | POA: Diagnosis not present

## 2024-02-25 DIAGNOSIS — H353221 Exudative age-related macular degeneration, left eye, with active choroidal neovascularization: Secondary | ICD-10-CM | POA: Diagnosis not present

## 2024-02-25 DIAGNOSIS — H35712 Central serous chorioretinopathy, left eye: Secondary | ICD-10-CM | POA: Diagnosis not present

## 2024-02-25 DIAGNOSIS — H26493 Other secondary cataract, bilateral: Secondary | ICD-10-CM | POA: Diagnosis not present

## 2024-02-25 DIAGNOSIS — E119 Type 2 diabetes mellitus without complications: Secondary | ICD-10-CM | POA: Diagnosis not present

## 2024-02-25 LAB — HM DIABETES EYE EXAM

## 2024-03-04 DIAGNOSIS — Z23 Encounter for immunization: Secondary | ICD-10-CM | POA: Diagnosis not present

## 2024-03-09 DIAGNOSIS — D3A8 Other benign neuroendocrine tumors: Secondary | ICD-10-CM | POA: Diagnosis not present

## 2024-03-09 DIAGNOSIS — C7B8 Other secondary neuroendocrine tumors: Secondary | ICD-10-CM | POA: Diagnosis not present

## 2024-03-09 DIAGNOSIS — C7A8 Other malignant neuroendocrine tumors: Secondary | ICD-10-CM | POA: Diagnosis not present

## 2024-03-18 DIAGNOSIS — H43393 Other vitreous opacities, bilateral: Secondary | ICD-10-CM | POA: Diagnosis not present

## 2024-03-18 DIAGNOSIS — E119 Type 2 diabetes mellitus without complications: Secondary | ICD-10-CM | POA: Diagnosis not present

## 2024-03-18 DIAGNOSIS — H43813 Vitreous degeneration, bilateral: Secondary | ICD-10-CM | POA: Diagnosis not present

## 2024-03-18 DIAGNOSIS — H353222 Exudative age-related macular degeneration, left eye, with inactive choroidal neovascularization: Secondary | ICD-10-CM | POA: Diagnosis not present

## 2024-03-18 DIAGNOSIS — H353112 Nonexudative age-related macular degeneration, right eye, intermediate dry stage: Secondary | ICD-10-CM | POA: Diagnosis not present

## 2024-03-19 ENCOUNTER — Ambulatory Visit (INDEPENDENT_AMBULATORY_CARE_PROVIDER_SITE_OTHER): Admitting: Nurse Practitioner

## 2024-03-19 ENCOUNTER — Encounter: Payer: Self-pay | Admitting: Nurse Practitioner

## 2024-03-19 VITALS — BP 106/64 | HR 74 | Temp 97.6°F | Ht 69.1 in | Wt 218.8 lb

## 2024-03-19 DIAGNOSIS — E118 Type 2 diabetes mellitus with unspecified complications: Secondary | ICD-10-CM

## 2024-03-19 DIAGNOSIS — I152 Hypertension secondary to endocrine disorders: Secondary | ICD-10-CM | POA: Diagnosis not present

## 2024-03-19 DIAGNOSIS — E1169 Type 2 diabetes mellitus with other specified complication: Secondary | ICD-10-CM | POA: Diagnosis not present

## 2024-03-19 DIAGNOSIS — E1159 Type 2 diabetes mellitus with other circulatory complications: Secondary | ICD-10-CM | POA: Diagnosis not present

## 2024-03-19 DIAGNOSIS — I4892 Unspecified atrial flutter: Secondary | ICD-10-CM | POA: Diagnosis not present

## 2024-03-19 DIAGNOSIS — E785 Hyperlipidemia, unspecified: Secondary | ICD-10-CM

## 2024-03-19 DIAGNOSIS — H353220 Exudative age-related macular degeneration, left eye, stage unspecified: Secondary | ICD-10-CM | POA: Diagnosis not present

## 2024-03-19 DIAGNOSIS — Z0000 Encounter for general adult medical examination without abnormal findings: Secondary | ICD-10-CM | POA: Diagnosis not present

## 2024-03-19 DIAGNOSIS — Z1211 Encounter for screening for malignant neoplasm of colon: Secondary | ICD-10-CM | POA: Diagnosis not present

## 2024-03-19 MED ORDER — METFORMIN HCL 500 MG PO TABS: 500.0000 mg | ORAL_TABLET | Freq: Two times a day (BID) | ORAL | 1 refills | Status: AC

## 2024-03-19 MED ORDER — DILTIAZEM HCL ER COATED BEADS 240 MG PO CP24: 240.0000 mg | ORAL_CAPSULE | Freq: Every day | ORAL | 1 refills | Status: AC

## 2024-03-19 NOTE — Assessment & Plan Note (Signed)
Chronic.  Controlled.  Continue with current medication regimen of Atorvastatin 80mg daily. Refills sent today.  Labs ordered today.  Return to clinic in 6 months for reevaluation.  Call sooner if concerns arise.   

## 2024-03-19 NOTE — Assessment & Plan Note (Addendum)
 Chronic.  Doing well.  Continues to follow up every 6 months.

## 2024-03-19 NOTE — Assessment & Plan Note (Signed)
 Chronic.  Controlled.  Last A1c was 6.8% in April.  Continue with current medication regimen of Metformin  500mg  BID.  Refill sent today.  Labs ordered today.  Microalbumin up to date.  Eye exam is up to date- need to request from [place]. Return to clinic in 6 months for reevaluation.  Call sooner if concerns arise.

## 2024-03-19 NOTE — Assessment & Plan Note (Signed)
 Chronic.  Controlled.  Continue with current medication regimen of Cardizem.  Refills sent today.  Labs ordered today.  Return to clinic in 6 months for reevaluation.  Call sooner if concerns arise.

## 2024-03-19 NOTE — Progress Notes (Signed)
 BP 106/64   Pulse 74   Temp 97.6 F (36.4 C) (Oral)   Ht 5' 9.1 (1.755 m)   Wt 218 lb 12.8 oz (99.2 kg)   SpO2 97%   BMI 32.22 kg/m    Subjective:    Patient ID: Randy Harper, male    DOB: [birth year], 73 y.o.   MRN: [id number]  HPI: Randy Harper is a 73 y.o. male presenting on 03/19/2024 for comprehensive medical examination. Current medical complaints include:none  He currently lives with: Interim Problems from his last visit: no  HYPERTENSION / HYPERLIPIDEMIA Patient started on Eliquis for Afib.  Satisfied with current treatment? yes Duration of hypertension: years BP monitoring frequency: not checking BP range:  BP medication side effects: no Past BP meds: cardizem  Duration of hyperlipidemia: years Cholesterol medication side effects: no Cholesterol supplements: none Past cholesterol medications: atorvastain (lipitor ) Medication compliance: excellent compliance Aspirin: no Recent stressors: no Recurrent headaches: no Visual changes: no Palpitations: no Dyspnea: no Chest pain: no Lower extremity edema: no Dizzy/lightheaded: no  DIABETES Metformin  500mg  Daily Hypoglycemic episodes:no Polydipsia/polyuria: no Visual disturbance: no Chest pain: no Paresthesias: no Glucose Monitoring: no  Accucheck frequency: Not Checking  Fasting glucose:  Post prandial:  Evening:  Before meals: Taking Insulin ?: no  Long acting insulin :  Short acting insulin : Blood Pressure Monitoring: not checking Retinal Examination: Up to Date Foot Exam: Up to Date Diabetic Education: Not Completed Pneumovax: Up to Date Influenza: Up to Date Aspirin: no  Depression Screen done today and results listed below:     03/19/2024    9:24 AM 09/18/2023   10:54 AM 03/19/2023   11:29 AM 08/24/2022    9:02 AM 02/09/2022    8:24 AM  Depression screen PHQ 2/9  Decreased Interest 0 0 0 0 0  Down, Depressed, Hopeless 0 0 0 0 0  PHQ - 2 Score 0 0 0 0 0  Altered sleeping 0 0 0 0 0   Tired, decreased energy 0 0 0 0 0  Change in appetite 0 0 0 0 0  Feeling bad or failure about yourself  0 0 0 0 0  Trouble concentrating 0 0 0 0 0  Moving slowly or fidgety/restless 0 0 0 0 0  Suicidal thoughts 0 0 0 0 0  PHQ-9 Score 0 0 0 0 0  Difficult doing work/chores Not difficult at all Not difficult at all Not difficult at all Not difficult at all Not difficult at all    The patient does not have a history of falls. I did complete a risk assessment for falls. A plan of care for falls was documented.   Past Medical History:  Past Medical History:  Diagnosis Date   Ankylosing spondylitis (HCC) 11/09/2021   Blood transfusion without reported diagnosis 01/2017   Cancer (HCC) 01/2017   metastic neuroendocrine - liver   Cataract 12/2022   Completed surgery   Diabetes mellitus without complication (HCC)    Dysrhythmia    supraventricular Tach   Glaucoma 2015   Hyperlipidemia    Hypertension    Muscle strain of chest wall    PE (pulmonary thromboembolism) (HCC)    SBO (small bowel obstruction) (HCC) 11/29/2021   Small bowel obstruction (HCC) 11/28/2021   Supraventricular tachycardia     Surgical History:  Past Surgical History:  Procedure Laterality Date   CHOLECYSTECTOMY  01/2017   related to liver cancer   COLONOSCOPY     COLONOSCOPY WITH PROPOFOL  N/A 03/17/2019  Procedure: COLONOSCOPY WITH PROPOFOL ;  Surgeon: Gaylyn Gladis PENNER, MD;  [facility] ENDOSCOPY;  Service: Endoscopy;  Laterality: N/A;   ESOPHAGOGASTRODUODENOSCOPY N/A 06/06/2021   Procedure: ESOPHAGOGASTRODUODENOSCOPY (EGD);  Surgeon: Janalyn Keene NOVAK, MD;  Location: [facility] ENDOSCOPY;  Service: Endoscopy;  Laterality: N/A;   ESOPHAGOGASTRODUODENOSCOPY (EGD) WITH PROPOFOL  N/A 08/22/2021   Procedure: ESOPHAGOGASTRODUODENOSCOPY (EGD) WITH PROPOFOL ;  Surgeon: Maryruth Ole DASEN, MD;  Location: [facility] ENDOSCOPY;  Service: Endoscopy;  Laterality: N/A;  DM   EYE SURGERY  12/24   LAPAROSCOPY ABDOMEN DIAGNOSTIC      LAPAROTOMY N/A 11/30/2021   Procedure: EXPLORATORY LAPAROTOMY;  Surgeon: Jordis Laneta FALCON, MD;  Location: [facility] ORS;  Service: General;  Laterality: N/A;   LYSIS OF ADHESION N/A 11/30/2021   Procedure: LYSIS OF ADHESION;  Surgeon: Jordis Laneta FALCON, MD;  Location: [facility] ORS;  Service: General;  Laterality: N/A;   resection liver total right lobe  12/2016   SMALL INTESTINE SURGERY  01/2017   cancer surgery   TENDON GRAFT Right    right thumb tendon graft at age 69    VENTRAL HERNIA REPAIR N/A 11/30/2021   Procedure: HERNIA REPAIR VENTRAL ADULT;  Surgeon: Jordis Laneta FALCON, MD;  Location: [facility] ORS;  Service: General;  Laterality: N/A;    Medications:  Current Outpatient Medications on File Prior to Visit  Medication Sig   acetaminophen  (TYLENOL ) 500 MG tablet Take 500 mg by mouth every 6 (six) hours as needed for mild pain, moderate pain or headache.   apixaban (ELIQUIS) 5 MG TABS tablet Take 5 mg by mouth 2 (two) times daily.   brimonidine  (ALPHAGAN ) 0.2 % ophthalmic solution Place 1 drop into the left eye daily.    lanreotide acetate (SOMATULINE DEPOT) 120 MG/0.5ML injection Inject 120 mg into the skin every 28 (twenty-eight) days.   Multiple Vitamin (MULTIVITAMIN) tablet Take 1 tablet by mouth daily.   Multiple Vitamins-Minerals (PRESERVISION AREDS 2+MULTI VIT PO) Take 1 tablet by mouth daily.   pantoprazole  (PROTONIX ) 20 MG tablet Take 20 mg by mouth daily.   rosuvastatin (CRESTOR) 40 MG tablet Take 40 mg by mouth daily.   Zoledronic  Acid (ZOMETA ) 4 MG/100ML IVPB Inject 4 mg into the vein every 3 (three) months.   No current facility-administered medications on file prior to visit.    Allergies:  No Known Allergies  Social History:  Social History   Socioeconomic History   Marital status: Married    Spouse name: Not on file   Number of children: Not on file   Years of education: Not on file   Highest education level: Master's degree (e.g., MA, MS, MEng, MEd, MSW, MBA)   Occupational History   Occupation: retired  Tobacco Use   Smoking status: Never   Smokeless tobacco: Never  Vaping Use   Vaping status: Never Used  Substance and Sexual Activity   Alcohol use: No   Drug use: No   Sexual activity: Yes    Birth control/protection: None  Other Topics Concern   Not on file  Social History Narrative   Not on file   Social Drivers of Health   Financial Resource Strain: Low Risk  (09/18/2023)   Overall Financial Resource Strain (CARDIA)    Difficulty of Paying Living Expenses: Not hard at all  Food Insecurity: No Food Insecurity (09/18/2023)   Hunger Vital Sign    Worried About Running Out of Food in the Last Year: Never true    Ran Out of Food in the Last Year: Never true  Transportation Needs: No Transportation Needs (09/18/2023)   PRAPARE - Administrator, Civil Service (Medical): No    Lack of Transportation (Non-Medical): No  Physical Activity: Insufficiently Active (09/18/2023)   Exercise Vital Sign    Days of Exercise per Week: 3 days    Minutes of Exercise per Session: 20 min  Stress: No Stress Concern Present (09/18/2023)   [employer] of Occupational Health - Occupational Stress Questionnaire    Feeling of Stress : Not at all  Social Connections: Socially Integrated (09/18/2023)   Social Connection and Isolation Panel    Frequency of Communication with Friends and Family: Three times a week    Frequency of Social Gatherings with Friends and Family: Three times a week    Attends Religious Services: 1 to 4 times per year    Active Member of Clubs or Organizations: Yes    Attends Banker Meetings: 1 to 4 times per year    Marital Status: Married  Catering manager Violence: Not At Risk (01/22/2022)   Humiliation, Afraid, Rape, and Kick questionnaire    Fear of Current or Ex-Partner: No    Emotionally Abused: No    Physically Abused: No    Sexually Abused: No   Social History   Tobacco Use  Smoking Status Never   Smokeless Tobacco Never   Social History   Substance and Sexual Activity  Alcohol Use No    Family History:  Family History  Problem Relation Age of Onset   Heart disease Father    Diabetes Maternal Uncle    Diabetes Maternal Uncle    Diabetes Maternal Uncle    Diabetes Maternal Uncle    Diabetes Maternal Grandmother     Past medical history, surgical history, medications, allergies, family history and social history reviewed with patient today and changes made to appropriate areas of the chart.   Review of Systems  HENT:         Denies vision changes.  Eyes:  Negative for blurred vision and double vision.  Respiratory:  Negative for shortness of breath.   Cardiovascular:  Negative for chest pain, palpitations and leg swelling.  Neurological:  Negative for dizziness, tingling and headaches.  Endo/Heme/Allergies:  Negative for polydipsia.       Denies Polyuria   All other ROS negative except what is listed above and in the HPI.      Objective:    BP 106/64   Pulse 74   Temp 97.6 F (36.4 C) (Oral)   Ht 5' 9.1 (1.755 m)   Wt 218 lb 12.8 oz (99.2 kg)   SpO2 97%   BMI 32.22 kg/m   Wt Readings from Last 3 Encounters:  03/19/24 218 lb 12.8 oz (99.2 kg)  09/18/23 226 lb 6.4 oz (102.7 kg)  03/19/23 209 lb 12.8 oz (95.2 kg)    Physical Exam Vitals and nursing note reviewed.  Constitutional:      General: He is not in acute distress.    Appearance: Normal appearance. He is normal weight. He is not ill-appearing, toxic-appearing or diaphoretic.  HENT:     Head: Normocephalic.     Right Ear: Tympanic membrane, ear canal and external ear normal.     Left Ear: Tympanic membrane, ear canal and external ear normal.     Nose: Nose normal. No congestion or rhinorrhea.     Mouth/Throat:     Mouth: Mucous membranes are moist.  Eyes:     General:  Right eye: No discharge.        Left eye: No discharge.     Extraocular Movements: Extraocular movements intact.      Conjunctiva/sclera: Conjunctivae normal.     Pupils: Pupils are equal, round, and reactive to light.  Cardiovascular:     Rate and Rhythm: Normal rate and regular rhythm.     Heart sounds: No murmur heard. Pulmonary:     Effort: Pulmonary effort is normal. No respiratory distress.     Breath sounds: Normal breath sounds. No wheezing, rhonchi or rales.  Abdominal:     General: Abdomen is flat. Bowel sounds are normal. There is no distension.     Palpations: Abdomen is soft.     Tenderness: There is no abdominal tenderness. There is no guarding.  Musculoskeletal:     Cervical back: Normal range of motion and neck supple.  Skin:    General: Skin is warm and dry.     Capillary Refill: Capillary refill takes less than 2 seconds.  Neurological:     General: No focal deficit present.     Mental Status: He is alert and oriented to person, place, and time.     Cranial Nerves: No cranial nerve deficit.     Motor: No weakness.     Deep Tendon Reflexes: Reflexes normal.  Psychiatric:        Mood and Affect: Mood normal.        Behavior: Behavior normal.        Thought Content: Thought content normal.        Judgment: Judgment normal.     Results for orders placed or performed in visit on 02/26/24  HM DIABETES EYE EXAM   Collection Time: 02/25/24  8:01 AM  Result Value Ref Range   HM Diabetic Eye Exam No Retinopathy No Retinopathy      Assessment & Plan:   Problem List Items Addressed This Visit       Cardiovascular and Mediastinum   Paroxysmal atrial flutter (HCC)   Chronic.  Controlled.  Continue with current medication regimen of Cardizem  and Eliquis.  Reviewed recent Cardiology note.  Labs ordered today.  Return to clinic in 6 months for reevaluation.  Call sooner if concerns arise.        Relevant Medications   rosuvastatin (CRESTOR) 40 MG tablet   diltiazem  (CARDIZEM  CD) 240 MG 24 hr capsule   Hypertension associated with diabetes (HCC)   Chronic.  Controlled.  Continue  with current medication regimen of Cardizem .  Refills sent today.  Labs ordered today.  Return to clinic in 6 months for reevaluation.  Call sooner if concerns arise.       Relevant Medications   rosuvastatin (CRESTOR) 40 MG tablet   diltiazem  (CARDIZEM  CD) 240 MG 24 hr capsule   metFORMIN  (GLUCOPHAGE ) 500 MG tablet     Endocrine   Hyperlipidemia associated with type 2 diabetes mellitus (HCC)   Chronic.  Controlled.  Continue with current medication regimen of Atorvastatin  80mg  daily.  Refills sent today.  Labs ordered today.  Return to clinic in 6 months for reevaluation.  Call sooner if concerns arise.       Relevant Medications   rosuvastatin (CRESTOR) 40 MG tablet   diltiazem  (CARDIZEM  CD) 240 MG 24 hr capsule   metFORMIN  (GLUCOPHAGE ) 500 MG tablet   Other Relevant Orders   Lipid panel   Controlled diabetes mellitus type 2 with complications (HCC)   Chronic.  Controlled.  Last A1c was  6.8% in April.  Continue with current medication regimen of Metformin  500mg  BID.  Refill sent today.  Labs ordered today.  Microalbumin up to date.  Eye exam is up to date- need to request from [facility]. Return to clinic in 6 months for reevaluation.  Call sooner if concerns arise.       Relevant Medications   rosuvastatin (CRESTOR) 40 MG tablet   metFORMIN  (GLUCOPHAGE ) 500 MG tablet   Other Relevant Orders   Hemoglobin A1c     Other   Macular degeneration, wet (HCC)   Chronic.  Doing well.  Continues to follow up every 6 months.      Other Visit Diagnoses       Annual physical exam    -  Primary   Health maintenance reviewed during visit today.  Labs ordered.  Vaccines reviewed.  Colon cancer screening ordered.   Relevant Orders   TSH   PSA   Lipid panel   CBC with Differential/Platelet   Comprehensive metabolic panel with GFR   Hemoglobin A1c     Screening for colon cancer       Relevant Orders   Ambulatory referral to Gastroenterology        Discussed aspirin prophylaxis for  myocardial infarction prevention and decision was it was not indicated  LABORATORY TESTING:  Health maintenance labs ordered today as discussed above.   The natural history of prostate cancer and ongoing controversy regarding screening and potential treatment outcomes of prostate cancer has been discussed with the patient. The meaning of a false positive PSA and a false negative PSA has been discussed. He indicates understanding of the limitations of this screening test and wishes to proceed with screening PSA testing.   IMMUNIZATIONS:   - Tdap: Tetanus vaccination status reviewed: last tetanus booster within 10 years. - Influenza: Up to date - Pneumovax: Up to date - Prevnar: Up to date - COVID: Up to date - HPV: Not applicable - Shingrix vaccine: Up to date  SCREENING: - Colonoscopy: Up to date  Discussed with patient purpose of the colonoscopy is to detect colon cancer at curable precancerous or early stages   - AAA Screening: Not applicable  -Hearing Test: Not applicable  -Spirometry: Not applicable   PATIENT COUNSELING:    Sexuality: Discussed sexually transmitted diseases, partner selection, use of condoms, avoidance of unintended pregnancy  and contraceptive alternatives.   Advised to avoid cigarette smoking.  I discussed with the patient that most people either abstain from alcohol or drink within safe limits (<=14/week and <=4 drinks/occasion for males, <=7/weeks and <= 3 drinks/occasion for females) and that the risk for alcohol disorders and other health effects rises proportionally with the number of drinks per week and how often a drinker exceeds daily limits.  Discussed cessation/primary prevention of drug use and availability of treatment for abuse.   Diet: Encouraged to adjust caloric intake to maintain  or achieve ideal body weight, to reduce intake of dietary saturated fat and total fat, to limit sodium intake by avoiding high sodium foods and not adding table  salt, and to maintain adequate dietary potassium and calcium  preferably from fresh fruits, vegetables, and low-fat dairy products.    stressed the importance of regular exercise  Injury prevention: Discussed safety belts, safety helmets, smoke detector, smoking near bedding or upholstery.   Dental health: Discussed importance of regular tooth brushing, flossing, and dental visits.   Follow up plan: NEXT PREVENTATIVE PHYSICAL DUE IN 1 YEAR. Return in about 6  months (around 09/17/2024) for HTN, HLD, DM2 FU.

## 2024-03-19 NOTE — Assessment & Plan Note (Signed)
 Chronic.  Controlled.  Continue with current medication regimen of Cardizem  and Eliquis.  Reviewed recent Cardiology note.  Labs ordered today.  Return to clinic in 6 months for reevaluation.  Call sooner if concerns arise.

## 2024-03-20 ENCOUNTER — Ambulatory Visit: Payer: Self-pay | Admitting: Nurse Practitioner

## 2024-03-20 LAB — COMPREHENSIVE METABOLIC PANEL WITH GFR
ALT: 20 IU/L (ref 0–44)
AST: 22 IU/L (ref 0–40)
Albumin: 4.5 g/dL (ref 3.8–4.8)
Alkaline Phosphatase: 79 IU/L (ref 47–123)
BUN/Creatinine Ratio: 17 (ref 10–24)
BUN: 15 mg/dL (ref 8–27)
Bilirubin Total: 0.3 mg/dL (ref 0.0–1.2)
CO2: 24 mmol/L (ref 20–29)
Calcium: 9.5 mg/dL (ref 8.6–10.2)
Chloride: 100 mmol/L (ref 96–106)
Creatinine, Ser: 0.88 mg/dL (ref 0.76–1.27)
Globulin, Total: 2.3 g/dL (ref 1.5–4.5)
Glucose: 127 mg/dL — ABNORMAL HIGH (ref 70–99)
Potassium: 4.4 mmol/L (ref 3.5–5.2)
Sodium: 139 mmol/L (ref 134–144)
Total Protein: 6.8 g/dL (ref 6.0–8.5)
eGFR: 90 mL/min/1.73 (ref >59)

## 2024-03-20 LAB — LIPID PANEL
Chol/HDL Ratio: 2.6 ratio (ref 0.0–5.0)
Cholesterol, Total: 110 mg/dL (ref 100–199)
HDL: 42 mg/dL (ref >39)
LDL Chol Calc (NIH): 46 mg/dL (ref 0–99)
Triglycerides: 120 mg/dL (ref 0–149)
VLDL Cholesterol Cal: 22 mg/dL (ref 5–40)

## 2024-03-20 LAB — CBC WITH DIFFERENTIAL/PLATELET
Basophils Absolute: 0 x10E3/uL (ref 0.0–0.2)
Basos: 1 %
EOS (ABSOLUTE): 0.2 x10E3/uL (ref 0.0–0.4)
Eos: 3 %
Hematocrit: 39.6 % (ref 37.5–51.0)
Hemoglobin: 13.3 g/dL (ref 13.0–17.7)
Immature Grans (Abs): 0 x10E3/uL (ref 0.0–0.1)
Immature Granulocytes: 0 %
Lymphocytes Absolute: 0.8 x10E3/uL (ref 0.7–3.1)
Lymphs: 15 %
MCH: 31.4 pg (ref 26.6–33.0)
MCHC: 33.6 g/dL (ref 31.5–35.7)
MCV: 94 fL (ref 79–97)
Monocytes Absolute: 0.7 x10E3/uL (ref 0.1–0.9)
Monocytes: 12 %
Neutrophils Absolute: 3.8 x10E3/uL (ref 1.4–7.0)
Neutrophils: 69 %
Platelets: 171 x10E3/uL (ref 150–450)
RBC: 4.23 x10E6/uL (ref 4.14–5.80)
RDW: 12.7 % (ref 11.6–15.4)
WBC: 5.6 x10E3/uL (ref 3.4–10.8)

## 2024-03-20 LAB — HEMOGLOBIN A1C
Est. average glucose Bld gHb Est-mCnc: 148 mg/dL
Hgb A1c MFr Bld: 6.8 % — ABNORMAL HIGH (ref 4.8–5.6)

## 2024-03-20 LAB — TSH: TSH: 1.27 u[IU]/mL (ref 0.450–4.500)

## 2024-03-20 LAB — PSA: Prostate Specific Ag, Serum: 0.6 ng/mL (ref 0.0–4.0)

## 2024-04-23 ENCOUNTER — Telehealth: Payer: Self-pay

## 2024-04-23 NOTE — Telephone Encounter (Signed)
 Appt has been scheduled with Dr. Aundria at [facility] 09/09/24.  Pt has been advised that we will close out his referral with our office.  Thanks, [place], CMA

## 2024-05-14 ENCOUNTER — Ambulatory Visit: Admitting: Emergency Medicine

## 2024-05-14 VITALS — Ht 68.0 in | Wt 218.0 lb

## 2024-05-14 DIAGNOSIS — Z0000 Encounter for general adult medical examination without abnormal findings: Secondary | ICD-10-CM

## 2024-05-14 NOTE — Progress Notes (Signed)
 Chief Complaint  Patient presents with   Medicare Wellness     Subjective:   Randy Harper is a 73 y.o. male who presents for a Medicare Annual Wellness Visit.  Visit info / Clinical Intake: Medicare Wellness Visit Type:: Subsequent Annual Wellness Visit Persons participating in visit and providing information:: patient Medicare Wellness Visit Mode:: Telephone If telephone:: video declined Since this visit was completed virtually, some vitals may be partially provided or unavailable. Missing vitals are due to the limitations of the virtual format.: Documented vitals are patient reported If Telephone or Video please confirm:: I connected with patient using audio/video enable telemedicine. I verified patient identity with two identifiers, discussed telehealth limitations, and patient agreed to proceed. Patient Location:: home Provider Location:: home Interpreter Needed?: No Pre-visit prep was completed: yes AWV questionnaire completed by patient prior to visit?: no Living arrangements:: lives with spouse/significant other Patient's Overall Health Status Rating: good Typical amount of pain: none Does pain affect daily life?: no Are you currently prescribed opioids?: no  Dietary Habits and Nutritional Risks How many meals a day?: 3 Eats fruit and vegetables daily?: yes Most meals are obtained by: having others provide food (wife prepares meals) In the last 2 weeks, have you had any of the following?: none Diabetic:: (!) yes Any non-healing wounds?: no How often do you check your BS?: as needed (rarely) Would you like to be referred to a Nutritionist or for Diabetic Management? : no  Functional Status Activities of Daily Living (to include ambulation/medication): Independent Ambulation: Independent with device- listed below Home Assistive Devices/Equipment: Eyeglasses Medication Administration: Independent Home Management (perform basic housework or laundry):  Independent Manage your own finances?: yes Primary transportation is: driving Concerns about vision?: no *vision screening is required for WTM* Concerns about hearing?: no  Fall Screening Falls in the past year?: 0 Number of falls in past year: 0 Was there an injury with Fall?: 0 Fall Risk Category Calculator: 0 Patient Fall Risk Level: Low Fall Risk  Fall Risk Patient at Risk for Falls Due to: No Fall Risks Fall risk Follow up: Falls evaluation completed  Home and Transportation Safety: All rugs have non-skid backing?: N/A, no rugs All stairs or steps have railings?: yes Grab bars in the bathtub or shower?: yes Have non-skid surface in bathtub or shower?: (!) no Good home lighting?: yes Regular seat belt use?: yes Hospital stays in the last year:: no  Cognitive Assessment Difficulty concentrating, remembering, or making decisions? : no Will 6CIT or Mini Cog be Completed: yes What year is it?: 0 points What month is it?: 0 points Give patient an address phrase to remember (5 components): [address] [place] About what time is it?: 0 points Count backwards from 20 to 1: 0 points Say the months of the year in reverse: 0 points Repeat the address phrase from earlier: 0 points 6 CIT Score: 0 points  Advance Directives (For Healthcare) Does Patient Have a Medical Advance Directive?: Yes Does patient want to make changes to medical advance directive?: No - Patient declined Type of Advance Directive: Healthcare Power of [place]; Living will Copy of Healthcare Power of Attorney in Chart?: No - copy requested Copy of Living Will in Chart?: No - copy requested  Reviewed/Updated  Reviewed/Updated: Reviewed All (Medical, Surgical, Family, Medications, Allergies, Care Teams, Patient Goals)    Allergies (verified) Patient has no known allergies.   Current Medications (verified) Outpatient Encounter Medications as of 05/14/2024  Medication Sig   acetaminophen  (TYLENOL )  500 MG tablet Take 500 mg by mouth every 6 (six) hours as needed for mild pain, moderate pain or headache.   apixaban (ELIQUIS) 5 MG TABS tablet Take 5 mg by mouth 2 (two) times daily.   brimonidine  (ALPHAGAN ) 0.2 % ophthalmic solution Place 1 drop into the left eye daily.    diltiazem  (CARDIZEM  CD) 240 MG 24 hr capsule Take 1 capsule (240 mg total) by mouth daily.   doxycycline (VIBRAMYCIN) 100 MG capsule Take 100 mg by mouth 2 (two) times daily.   lanreotide acetate (SOMATULINE DEPOT) 120 MG/0.5ML injection Inject 120 mg into the skin every 28 (twenty-eight) days.   metFORMIN  (GLUCOPHAGE ) 500 MG tablet Take 1 tablet (500 mg total) by mouth 2 (two) times daily with a meal.   Multiple Vitamin (MULTIVITAMIN) tablet Take 1 tablet by mouth daily.   Multiple Vitamins-Minerals (PRESERVISION AREDS 2+MULTI VIT PO) Take 1 tablet by mouth daily.   pantoprazole  (PROTONIX ) 20 MG tablet Take 20 mg by mouth daily.   rosuvastatin (CRESTOR) 40 MG tablet Take 40 mg by mouth daily.   Zoledronic  Acid (ZOMETA ) 4 MG/100ML IVPB Inject 4 mg into the vein every 3 (three) months.   No facility-administered encounter medications on file as of 05/14/2024.    History: Past Medical History:  Diagnosis Date   Ankylosing spondylitis (HCC) 11/09/2021   Blood transfusion without reported diagnosis 01/2017   Cancer (HCC) 01/2017   metastic neuroendocrine - liver   Cataract 12/2022   Completed surgery   Diabetes mellitus without complication (HCC)    Dysrhythmia    supraventricular Tach   Glaucoma 2015   Hyperlipidemia    Hypertension    Muscle strain of chest wall    PE (pulmonary thromboembolism) (HCC)    SBO (small bowel obstruction) (HCC) 11/29/2021   Small bowel obstruction (HCC) 11/28/2021   Supraventricular tachycardia    Past Surgical History:  Procedure Laterality Date   CHOLECYSTECTOMY  01/2017   related to liver cancer   COLONOSCOPY     COLONOSCOPY WITH PROPOFOL  N/A 03/17/2019   Procedure:  COLONOSCOPY WITH PROPOFOL ;  Surgeon: Gaylyn Gladis PENNER, MD;  [facility] ENDOSCOPY;  Service: Endoscopy;  Laterality: N/A;   ESOPHAGOGASTRODUODENOSCOPY N/A 06/06/2021   Procedure: ESOPHAGOGASTRODUODENOSCOPY (EGD);  Surgeon: Janalyn Keene NOVAK, MD;  Location: [facility] ENDOSCOPY;  Service: Endoscopy;  Laterality: N/A;   ESOPHAGOGASTRODUODENOSCOPY (EGD) WITH PROPOFOL  N/A 08/22/2021   Procedure: ESOPHAGOGASTRODUODENOSCOPY (EGD) WITH PROPOFOL ;  Surgeon: Maryruth Ole DASEN, MD;  Location: [facility] ENDOSCOPY;  Service: Endoscopy;  Laterality: N/A;  DM   EYE SURGERY  12/24   LAPAROSCOPY ABDOMEN DIAGNOSTIC     LAPAROTOMY N/A 11/30/2021   Procedure: EXPLORATORY LAPAROTOMY;  Surgeon: Jordis Laneta FALCON, MD;  Location: [facility] ORS;  Service: General;  Laterality: N/A;   LYSIS OF ADHESION N/A 11/30/2021   Procedure: LYSIS OF ADHESION;  Surgeon: Jordis Laneta FALCON, MD;  Location: [facility] ORS;  Service: General;  Laterality: N/A;   resection liver total right lobe  12/2016   SMALL INTESTINE SURGERY  01/2017   cancer surgery   TENDON GRAFT Right    right thumb tendon graft at age 46    VENTRAL HERNIA REPAIR N/A 11/30/2021   Procedure: HERNIA REPAIR VENTRAL ADULT;  Surgeon: Jordis Laneta FALCON, MD;  Location: [facility] ORS;  Service: General;  Laterality: N/A;   Family History  Problem Relation Age of Onset   Heart disease Father    Diabetes Maternal Uncle    Diabetes Maternal Uncle    Diabetes Maternal Uncle  Diabetes Maternal Uncle    Diabetes Maternal Grandmother    Social History   Occupational History   Occupation: retired  Tobacco Use   Smoking status: Never   Smokeless tobacco: Never  Vaping Use   Vaping status: Never Used  Substance and Sexual Activity   Alcohol use: No   Drug use: No   Sexual activity: Yes    Birth control/protection: None   Tobacco Counseling Counseling given: No  SDOH Screenings   Food Insecurity: No Food Insecurity (05/14/2024)  Housing: Low Risk  (05/14/2024)  Transportation  Needs: No Transportation Needs (05/14/2024)  Utilities: Not At Risk (05/14/2024)  Alcohol Screen: Low Risk  (01/22/2022)  Depression (PHQ2-9): Low Risk  (05/14/2024)  Financial Resource Strain: Low Risk  (09/18/2023)  Physical Activity: Insufficiently Active (05/14/2024)  Social Connections: Socially Integrated (05/14/2024)  Stress: No Stress Concern Present (05/14/2024)  Tobacco Use: Low Risk  (05/14/2024)  Health Literacy: Adequate Health Literacy (05/14/2024)   See flowsheets for full screening details  Depression Screen PHQ 2 & 9 Depression Scale- Over the past 2 weeks, how often have you been bothered by any of the following problems? Little interest or pleasure in doing things: 0 Feeling down, depressed, or hopeless (PHQ Adolescent also includes...irritable): 0 PHQ-2 Total Score: 0 Trouble falling or staying asleep, or sleeping too much: 0 Feeling tired or having little energy: 0 Poor appetite or overeating (PHQ Adolescent also includes...weight loss): 0 Feeling bad about yourself - or that you are a failure or have let yourself or your family down: 0 Trouble concentrating on things, such as reading the newspaper or watching television (PHQ Adolescent also includes...like school work): 0 Moving or speaking so slowly that other people could have noticed. Or the opposite - being so fidgety or restless that you have been moving around a lot more than usual: 0 Thoughts that you would be better off dead, or of hurting yourself in some way: 0 PHQ-9 Total Score: 0 If you checked off any problems, how difficult have these problems made it for you to do your work, take care of things at home, or get along with other people?: Not difficult at all  Depression Treatment Depression Interventions/Treatment : [id number] Score <4 Follow-up Not Indicated     Goals Addressed               This Visit's Progress     lose weight (pt-stated)               Objective:    Today's Vitals   05/14/24  0842  Weight: 218 lb (98.9 kg)  Height: 5' 8 (1.727 m)   Body mass index is 33.15 kg/m.  Hearing/Vision screen Hearing Screening - Comments:: Denies hearing loss  Vision Screening - Comments:: UTD w/ Dr. Robynn Mevelyn Molly [place] Immunizations and Health Maintenance Health Maintenance  Topic Date Due   Colonoscopy  03/16/2024   COVID-19 Vaccine (7 - Pfizer risk 2025-26 season) 09/01/2024   Diabetic kidney evaluation - Urine ACR  09/17/2024   HEMOGLOBIN A1C  09/17/2024   OPHTHALMOLOGY EXAM  02/24/2025   Diabetic kidney evaluation - eGFR measurement  03/19/2025   FOOT EXAM  03/19/2025   Medicare Annual Wellness (AWV)  05/14/2025   DTaP/Tdap/Td (3 - Tdap) 06/30/2028   Pneumococcal Vaccine: 50+ Years  Completed   Influenza Vaccine  Completed   Hepatitis C Screening  Completed   Zoster Vaccines- Shingrix  Completed   Meningococcal B Vaccine  Aged Out  Assessment/Plan:  This is a routine wellness examination for [facility].  Patient Care Team: Melvin Pao, NP as PCP - General Mevelyn JONETTA Bathe, OD (Optometry) Markham Sharper, MD as Referring Physician (Oncology) Launie Maiden, Ronal Maxwell, NP as Nurse Practitioner (Cardiology)  I have personally reviewed and noted the following in the patient's chart:   Medical and social history Use of alcohol, tobacco or illicit drugs  Current medications and supplements including opioid prescriptions. Functional ability and status Nutritional status Physical activity Advanced directives List of other physicians Hospitalizations, surgeries, and ER visits in previous 12 months Vitals Screenings to include cognitive, depression, and falls Referrals and appointments  No orders of the defined types were placed in this encounter.  In addition, I have reviewed and discussed with patient certain preventive protocols, quality metrics, and best practice recommendations. A written personalized care plan for preventive services as well  as general preventive health recommendations were provided to patient.   Vina Ned, CMA   05/14/2024   Return in 53 weeks (on 05/20/2025).  After Visit Summary: (MyChart) Due to this being a telephonic visit, the after visit summary with patients personalized plan was offered to patient via MyChart   Nurse Notes:  Has GI appt on 09/09/24 for consult for colonoscopy (due ~03/16/24) Declined DM & Nutrition education referral

## 2024-05-14 NOTE — Patient Instructions (Signed)
 [name],  Thank you for taking the time for your Medicare Wellness Visit. I appreciate your continued commitment to your health goals. Please review the care plan we discussed, and feel free to reach out if I can assist you further.  Please note that Annual Wellness Visits do not include a physical exam. Some assessments may be limited, especially if the visit was conducted virtually. If needed, we may recommend an in-person follow-up with your provider.  Ongoing Care Seeing your primary care provider every 3 to 6 months helps us  monitor your health and provide consistent, personalized care.   Referrals If a referral was made during today's visit and you haven't received any updates within two weeks, please contact the referred provider directly to check on the status.  Recommended Screenings:  Keep up the good work!!  Health Maintenance  Topic Date Due   Colon Cancer Screening  03/16/2024   Medicare Annual Wellness Visit  03/18/2024   COVID-19 Vaccine (7 - Pfizer risk 2025-26 season) 09/01/2024   Yearly kidney health urinalysis for diabetes  09/17/2024   Hemoglobin A1C  09/17/2024   Eye exam for diabetics  02/24/2025   Yearly kidney function blood test for diabetes  03/19/2025   Complete foot exam   03/19/2025   DTaP/Tdap/Td vaccine (3 - Tdap) 06/30/2028   Pneumococcal Vaccine for age over 63  Completed   Flu Shot  Completed   Hepatitis C Screening  Completed   Zoster (Shingles) Vaccine  Completed   Meningitis B Vaccine  Aged Out       05/14/2024    8:45 AM  Advanced Directives  Does Patient Have a Medical Advance Directive? Yes  Type of Estate Agent of [place];Living will  Does patient want to make changes to medical advance directive? No - Patient declined  Copy of Healthcare Power of Attorney in Chart? No - copy requested    Vision: Annual vision screenings are recommended for early detection of glaucoma, cataracts, and diabetic retinopathy. These  exams can also reveal signs of chronic conditions such as diabetes and high blood pressure.  Dental: Annual dental screenings help detect early signs of oral cancer, gum disease, and other conditions linked to overall health, including heart disease and diabetes.  Please see the attached documents for additional preventive care recommendations.

## 2024-09-18 ENCOUNTER — Ambulatory Visit: Admitting: Nurse Practitioner

## 2025-05-20 ENCOUNTER — Ambulatory Visit
